# Patient Record
Sex: Male | Born: 1950 | Race: White | Marital: Married | Smoking: Former smoker
Health system: Southern US, Community
[De-identification: ages and names within clinical notes are randomized; demographics above are authoritative.]

## PROBLEM LIST (undated history)

## (undated) DIAGNOSIS — E785 Hyperlipidemia, unspecified: Secondary | ICD-10-CM

## (undated) HISTORY — PX: NECK SURGERY: SHX720

## (undated) HISTORY — DX: Hyperlipidemia, unspecified: E78.5

## (undated) HISTORY — PX: SPINE SURGERY: SHX786

---

## 2014-10-20 DIAGNOSIS — R351 Nocturia: Secondary | ICD-10-CM | POA: Insufficient documentation

## 2014-10-20 DIAGNOSIS — R350 Frequency of micturition: Secondary | ICD-10-CM | POA: Insufficient documentation

## 2014-10-20 DIAGNOSIS — N138 Other obstructive and reflux uropathy: Secondary | ICD-10-CM | POA: Insufficient documentation

## 2014-10-20 DIAGNOSIS — D400 Neoplasm of uncertain behavior of prostate: Secondary | ICD-10-CM | POA: Insufficient documentation

## 2014-10-20 DIAGNOSIS — D4010 Neoplasm of uncertain behavior of unspecified testis: Secondary | ICD-10-CM | POA: Insufficient documentation

## 2016-06-27 DIAGNOSIS — E7801 Familial hypercholesterolemia: Secondary | ICD-10-CM | POA: Insufficient documentation

## 2016-06-27 DIAGNOSIS — M539 Dorsopathy, unspecified: Secondary | ICD-10-CM | POA: Insufficient documentation

## 2016-06-27 DIAGNOSIS — E786 Lipoprotein deficiency: Secondary | ICD-10-CM | POA: Insufficient documentation

## 2016-06-27 DIAGNOSIS — G894 Chronic pain syndrome: Secondary | ICD-10-CM | POA: Insufficient documentation

## 2016-06-27 DIAGNOSIS — N411 Chronic prostatitis: Secondary | ICD-10-CM | POA: Insufficient documentation

## 2016-06-28 DIAGNOSIS — N400 Enlarged prostate without lower urinary tract symptoms: Secondary | ICD-10-CM | POA: Insufficient documentation

## 2016-07-20 DIAGNOSIS — Z0000 Encounter for general adult medical examination without abnormal findings: Secondary | ICD-10-CM | POA: Insufficient documentation

## 2016-07-21 DIAGNOSIS — D492 Neoplasm of unspecified behavior of bone, soft tissue, and skin: Secondary | ICD-10-CM | POA: Insufficient documentation

## 2016-07-21 DIAGNOSIS — Z85820 Personal history of malignant melanoma of skin: Secondary | ICD-10-CM | POA: Insufficient documentation

## 2016-07-21 DIAGNOSIS — R7989 Other specified abnormal findings of blood chemistry: Secondary | ICD-10-CM | POA: Insufficient documentation

## 2016-07-21 DIAGNOSIS — R944 Abnormal results of kidney function studies: Secondary | ICD-10-CM | POA: Insufficient documentation

## 2016-10-25 DIAGNOSIS — R7989 Other specified abnormal findings of blood chemistry: Secondary | ICD-10-CM | POA: Insufficient documentation

## 2016-10-25 DIAGNOSIS — W57XXXA Bitten or stung by nonvenomous insect and other nonvenomous arthropods, initial encounter: Secondary | ICD-10-CM | POA: Insufficient documentation

## 2017-01-07 DIAGNOSIS — Z5181 Encounter for therapeutic drug level monitoring: Secondary | ICD-10-CM | POA: Insufficient documentation

## 2017-01-07 DIAGNOSIS — R972 Elevated prostate specific antigen [PSA]: Secondary | ICD-10-CM | POA: Insufficient documentation

## 2017-01-10 DIAGNOSIS — N419 Inflammatory disease of prostate, unspecified: Secondary | ICD-10-CM | POA: Insufficient documentation

## 2017-08-09 DIAGNOSIS — R030 Elevated blood-pressure reading, without diagnosis of hypertension: Secondary | ICD-10-CM | POA: Insufficient documentation

## 2017-08-09 DIAGNOSIS — K219 Gastro-esophageal reflux disease without esophagitis: Secondary | ICD-10-CM | POA: Insufficient documentation

## 2017-08-09 DIAGNOSIS — F938 Other childhood emotional disorders: Secondary | ICD-10-CM | POA: Insufficient documentation

## 2017-11-25 DIAGNOSIS — E782 Mixed hyperlipidemia: Secondary | ICD-10-CM | POA: Insufficient documentation

## 2018-05-21 DIAGNOSIS — K219 Gastro-esophageal reflux disease without esophagitis: Secondary | ICD-10-CM | POA: Insufficient documentation

## 2020-02-14 DIAGNOSIS — G894 Chronic pain syndrome: Secondary | ICD-10-CM | POA: Insufficient documentation

## 2020-02-14 DIAGNOSIS — Z789 Other specified health status: Secondary | ICD-10-CM | POA: Insufficient documentation

## 2020-02-14 DIAGNOSIS — M899 Disorder of bone, unspecified: Secondary | ICD-10-CM | POA: Insufficient documentation

## 2020-02-14 DIAGNOSIS — Z79899 Other long term (current) drug therapy: Secondary | ICD-10-CM | POA: Insufficient documentation

## 2020-02-14 NOTE — Progress Notes (Signed)
Patient: Todd Bradford  Service Category: E/M  Provider: Gaspar Cola, MD  DOB: 07-15-1950  DOS: 02/15/2020  Referring Provider: Durenda Age, FNP  MRN: [id number]  Setting: Ambulatory outpatient  PCP: Todd Peacock, NP  Type: New Patient  Specialty: Interventional Pain Management    Location: Office  Delivery: Face-to-face     Primary Reason(s) for Visit: Encounter for initial evaluation of one or more chronic problems (new to examiner) potentially causing chronic pain, and posing a threat to normal musculoskeletal function. (Level of risk: High) CC: Back Pain (low), Leg Pain (left), and Hip Pain (left)  HPI  Todd Bradford is a 70 y.o. year old, male patient, who comes for the first time to our practice referred by Todd Age, FNP for our initial evaluation of his chronic pain. 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 Today he comes in for evaluation of his Back Pain (low), Leg Pain (left), and Hip Pain (left)  Pain Assessment: Location: Lower Back Radiating: left leg Onset: More than a month ago (started Jan 3,2022 after lifting a table.) Duration: Chronic pain Quality: Tingling,Numbness,Pins and needles,Radiating,Sharp,Sore,Aching Severity: 6 /10 (subjective, self-reported pain score)   Effect on ADL:  At the beginning when it first happened on January 04, 2020 it took approximately 2 days for the pain to be experienced in the leg however, after that, he was unable to ambulate except for walker. Timing: Intermittent (upon moving at all the pain starts) Modifying factors: rest BP: 136/75  HR: (!) 116  Onset and Duration: Sudden and Date of injury: 01-04-2020 Cause of pain: moving and lifting furniture Severity: No change since onset, NAS-11 at its worse: 8/10, NAS-11 at its best: 5/10, NAS-11 now: 6/10 and NAS-11 on the average: 6/10 Timing: During activity or exercise and After activity or exercise Aggravating Factors: Lifiting, Motion, Prolonged sitting, Prolonged standing, Stooping , Twisting and Walking Alleviating Factors: Hot packs, Lying down, Medications, Resting and Sleeping Associated Problems: Fatigue, Numbness, Tingling and Weakness Quality of Pain: Aching, Agonizing, Exhausting, Nagging, Sharp, Shooting, Tingling, Tiring and Uncomfortable Previous Examinations or Tests: MRI scan, X-rays, Orthopedic evaluation and Chiropractic evaluation Previous Treatments: Narcotic medications and Steroid treatments by mouth  According to the patient, on January 04, 2020 he was lifting and then table onto his truck and suddenly he experienced a sharp pain in the lower back that made him dropped table.  The pain started in the left hip and left lower back area and by the second day he was already experiencing pain and numbness in his left big toe and the top of his foot and what seems to be an L5 dermatomal distribution.  Because he experienced this pain 2 days later, this would suggest that the initial injury was in his back and it took approximately 2 days for the swelling to affect the L5 nerve root.  The patient was given a 10-day steroid taper with prednisone which brought the pain down from a 10/10, but  it did not make it go away.  Therefore he was given a second round of oral  steroids but this time with Decadron, for another 10 days.  This helped the patient moved from a walker to a cane, but he is still having significant difficulties with prolonged standing or standing up straight.  Since the event, he has been walking around with his lumbar spine flexed.  The mechanism of the injury seems to involve the lumbar facets, but his current symptoms and the leg pain and numbness as well as weakness seem to also pointed at the L5 nerve root to be significantly irritated.  Physical exam today demonstrated the patient to have decreased range of motion of the lumbar spine especially on hyperextension on rotation.  This maneuver trigger the pain in the left lower back.  Hyperextension and rotation towards the left was positive for left lumbar facet arthralgia.  Patrick maneuver was negative bilaterally for SI joint, but on the left side it did trigger some pain in the lateral aspect of the hip suggesting some involvement of the left hip joint.  Straight leg raise was within normal limits on the right side but significantly decreased on the left with a positive SLR with pain in the lower back and referred towards the lower extremity, again all on the left side.  Today the patient was able to toe walk and heel walk with some significant difficulty and instability.  DTRs were +3 on the right side and +1 on the left side for the patellar tendon (L4) and they were completely absent bilaterally on the Achilles tendon (S1).  At this point, he indicates that his primary area of pain is that of the left hip.  His secondary area pain is that of the left lower extremity going all the way down to the top of the foot and the big toe (L5).  His third area pain is that of the lower back on the left side.  The patient does have an MRI of the lumbar spine which was done at [facility] in [place], [name] on 01/16/2020.  (01/16/2020) LUMBAR MRI FINDINGS: Vertebral bodies: No acute  fractures.  Normal bone marrow signal. Alignment: Levoscoliosis centered at L3.  Anterolisthesis of L4 over L5 measuring 3 mm. Conus medullaris: Normal caliber and position. Degenerative changes: Disc desiccation and volume loss at all levels most advanced at L3-4 and L4-5.  DISC LEVELS: T12-L1: No foraminal or canal stenosis. L1-2: Posterior disc bulge.  Facet hypertrophy.  Ligamentum flavum thickening.  No foraminal or central canal stenosis. L2-3: Posterior disc bulge, facet hypertrophy and ligamentum flavum thickening result in circumferential effacement of CSF and crowding of the lateral fruits of the cauda equina.  Mild bilateral foraminal stenosis.  Moderate canal stenosis. L3-4: Broad posterior disc bulge.  Facet hypertrophy.  Ligamentum flavum thickening.  Near circumferential effacement of CSF with crowding of the nerve roots of the cauda equina.  Moderate bilateral foraminal stenosis.  Mild canal stenosis. L4-5: Broad posterior disc bulge.  Facet hypertrophy.  Ligamentum flavum thickening.  Moderate bilateral foraminal stenosis.  Mild canal stenosis. L5-S1: Broad posterior disc bulge.  Facet hypertrophy.  Ligamentum flavum thickening.  Moderate left and mild right foraminal stenosis.  No canal stenosis.  No foraminal or canal stenosis.  IMPRESSION: Moderate foraminal stenosis at L3-4 and L4-5 bilaterally and on the left at L5-S1 as well as moderate canal stenosis at the L3-4 related to degenerative disc and facet disease.  Today I took the  time to provide the patient with information regarding my pain practice. The patient was informed that my practice is divided into two sections: an interventional pain management section, as well as a completely separate and distinct medication management section. I explained that I have procedure days for my interventional therapies, and evaluation days for follow-ups and medication management. Because of the amount of documentation required during  both, they are kept separated. This means that there is the possibility that he may be scheduled for a procedure on one day, and medication management the next. I have also informed him that because of staffing and facility limitations, I no longer take patients for medication management only. To illustrate the reasons for this, I gave the patient the example of surgeons, and how inappropriate it would be to refer a patient to his/her care, just to write for the post-surgical antibiotics on a surgery done by a different surgeon.   Because interventional pain management is my board-certified specialty, the patient was informed that joining my practice means that they are open to any and all interventional therapies. I made it clear that this does not mean that they will be forced to have any procedures done. What this means is that I believe interventional therapies to be essential part of the diagnosis and proper management of chronic pain conditions. Therefore, patients not interested in these interventional alternatives will be better served under the care of a different practitioner.  The patient was also made aware of my Comprehensive Pain Management Safety Guidelines where by joining my practice, they limit all of their nerve blocks and joint injections to those done by our practice, for as long as we are retained to manage their care.   Historic Controlled Substance Pharmacotherapy Review  PMP and historical list of controlled substances: Tramadol 50 mg, half a tablet p.o. daily Current opioid analgesics:  Tramadol 50 mg, half a tablet p.o. daily MME/day: 2.5 mg/day  Historical Monitoring: The patient  has no history on file for drug use. List of all UDS Test(s): No results found for: MDMA, COCAINSCRNUR, [place], [place], CANNABQUANT, THCU, [place] List of other Serum/Urine Drug Screening Test(s):  No results found for: AMPHSCRSER, BARBSCRSER, BENZOSCRSER, COCAINSCRSER, COCAINSCRNUR, PCPSCRSER,  PCPQUANT, THCSCRSER, THCU, CANNABQUANT, OPIATESCRSER, OXYSCRSER, PROPOXSCRSER, ETH Historical Background Evaluation: [place] PMP: PDMP reviewed during this encounter. Online review of the past 74-monthperiod conducted.             PMP NARX Score Report:  Narcotic: 000 Sedative: 000 Stimulant: 000 [place] Department of public safety, offender search: (Editor, commissioningInformation) Non-contributory Risk Assessment Profile: Aberrant behavior: None observed or detected today Risk factors for fatal opioid overdose: None identified today PMP NARX Overdose Risk Score: 000 Fatal overdose hazard ratio (HR): Calculation deferred Non-fatal overdose hazard ratio (HR): Calculation deferred Risk of opioid abuse or dependence: 0.7-3.0% with doses ? 36 MME/day and 6.1-26% with doses ? 120 MME/day. Substance use disorder (SUD) risk level: See below Personal History of Substance Abuse (SUD-Substance use disorder):  Alcohol: Negative  Illegal Drugs: Negative  Rx Drugs: Negative  ORT Risk Level calculation: Low Risk  Opioid Risk Tool - 02/15/20 1129      Family History of Substance Abuse   Alcohol Negative    Illegal Drugs Negative    Rx Drugs Negative      Personal History of Substance Abuse   Alcohol Negative    Illegal Drugs Negative    Rx Drugs Negative      Bradford   Bradford  between 16-45 years  No      History of Preadolescent Sexual Abuse   History of Preadolescent Sexual Abuse Negative or Male      Psychological Disease   Psychological Disease Negative    Depression Negative      Total Score   Opioid Risk Tool Scoring 0    Opioid Risk Interpretation Low Risk          ORT Scoring interpretation table:  Score <3 = Low Risk for SUD  Score between 4-7 = Moderate Risk for SUD  Score >8 = High Risk for Opioid Abuse   PHQ-2 Depression Scale:  Total score: 0  PHQ-2 Scoring interpretation table: (Score and probability of major depressive disorder)  Score 0 = No depression  Score 1 = 15.4% Probability   Score 2 = 21.1% Probability  Score 3 = 38.4% Probability  Score 4 = 45.5% Probability  Score 5 = 56.4% Probability  Score 6 = 78.6% Probability   PHQ-9 Depression Scale:  Total score: 0  PHQ-9 Scoring interpretation table:  Score 0-4 = No depression  Score 5-9 = Mild depression  Score 10-14 = Moderate depression  Score 15-19 = Moderately severe depression  Score 20-27 = Severe depression (2.4 times higher risk of SUD and 2.89 times higher risk of overuse)   Pharmacologic Plan: As per protocol, I have not taken over any controlled substance management, pending the results of ordered tests and/or consults.            Initial impression: Pending review of available data and ordered tests.  Meds   Current Outpatient Medications:  .  atorvastatin (LIPITOR) 40 MG tablet, Take 40 mg by mouth at bedtime., Disp: , Rfl:  .  cyclobenzaprine (FLEXERIL) 5 MG tablet, Take 5 mg by mouth 3 (three) times daily as needed., Disp: , Rfl:  .  finasteride (PROSCAR) 5 MG tablet, Take 5 mg by mouth daily., Disp: , Rfl:  .  tamsulosin (FLOMAX) 0.4 MG CAPS capsule, Take 0.8 mg by mouth daily., Disp: , Rfl:  .  traMADol (ULTRAM) 50 MG tablet, Take 50 mg by mouth 2 (two) times daily as needed., Disp: , Rfl:   Imaging Review   Complexity Note: No results found under the [facility] record.                        ROS  Cardiovascular: No reported cardiovascular signs or symptoms such as High blood pressure, coronary artery disease, abnormal heart rate or rhythm, heart attack, blood thinner therapy or heart weakness and/or failure Pulmonary or Respiratory: Snoring  Neurological: No reported neurological signs or symptoms such as seizures, abnormal skin sensations, urinary and/or fecal incontinence, being born with an abnormal open spine and/or a tethered spinal cord Psychological-Psychiatric: No reported psychological or psychiatric signs or symptoms such as difficulty sleeping, anxiety,  depression, delusions or hallucinations (schizophrenial), mood swings (bipolar disorders) or suicidal ideations or attempts Gastrointestinal: No reported gastrointestinal signs or symptoms such as vomiting or evacuating blood, reflux, heartburn, alternating episodes of diarrhea and constipation, inflamed or scarred liver, or pancreas or irrregular and/or infrequent bowel movements Genitourinary: No reported renal or genitourinary signs or symptoms such as difficulty voiding or producing urine, peeing blood, non-functioning kidney, kidney stones, difficulty emptying the bladder, difficulty controlling the flow of urine, or chronic kidney disease Hematological: Brusing easily and Bleeding easily Endocrine: No reported endocrine signs or symptoms such as high or low blood sugar, rapid  heart rate due to high thyroid levels, obesity or weight gain due to slow thyroid or thyroid disease Rheumatologic: Joint aches and or swelling due to excess weight (Osteoarthritis) Musculoskeletal: Negative for myasthenia gravis, muscular dystrophy, multiple sclerosis or malignant hyperthermia Work History: Retired  Allergies  Todd Bradford has No Known Allergies.  Laboratory Chemistry Profile   Renal No results found for: BUN, CREATININE, LABCREA, BCR, GFR, GFRAA, GFRNONAA, SPECGRAV, PHUR, PROTEINUR   Electrolytes No results found for: NA, K, CL, CALCIUM, MG, PHOS   Hepatic No results found for: AST, ALT, ALBUMIN, ALKPHOS, AMYLASE, LIPASE, AMMONIA   ID No results found for: LYMEIGGIGMAB, HIV, SARSCOV2NAA, STAPHAUREUS, MRSAPCR, HCVAB, PREGTESTUR, RMSFIGG, QFVRPH1IGG, QFVRPH2IGG, LYMEIGGIGMAB   Bone No results found for: VD25OH, [id number], CW1969GK9, [id number], 25OHVITD1, 25OHVITD2, 25OHVITD3, TESTOFREE, TESTOSTERONE   Endocrine No results found for: GLUCOSE, GLUCOSEU, HGBA1C, TSH, FREET4, TESTOFREE, TESTOSTERONE, SHBG, ESTRADIOL, ESTRADIOLPCT, ESTRADIOLFRE, LABPREG, ACTH, CRTSLPL, UCORFRPERLTR, UCORFRPERDAY,  CORTISOLBASE, LABPREG   Neuropathy No results found for: VITAMINB12, FOLATE, HGBA1C, HIV   CNS No results found for: COLORCSF, APPEARCSF, RBCCOUNTCSF, WBCCSF, POLYSCSF, LYMPHSCSF, EOSCSF, PROTEINCSF, GLUCCSF, JCVIRUS, CSFOLI, IGGCSF, LABACHR, ACETBL, LABACHR, ACETBL   Inflammation (CRP: Acute  ESR: Chronic) No results found for: CRP, ESRSEDRATE, LATICACIDVEN   Rheumatology No results found for: RF, ANA, LABURIC, URICUR, LYMEIGGIGMAB, LYMEABIGMQN, HLAB27   Coagulation No results found for: INR, LABPROT, APTT, PLT, DDIMER, LABHEMA, VITAMINK1, AT3   Cardiovascular No results found for: BNP, CKTOTAL, CKMB, TROPONINI, HGB, HCT, LABVMA, EPIRU, EPINEPH24HUR, NOREPRU, NOREPI24HUR, DOPARU, DOPAM24HRUR   Screening No results found for: SARSCOV2NAA, COVIDSOURCE, STAPHAUREUS, MRSAPCR, HCVAB, HIV, PREGTESTUR   Cancer No results found for: CEA, CA125, LABCA2   Allergens No results found for: ALMOND, APPLE, ASPARAGUS, AVOCADO, BANANA, BARLEY, BASIL, BAYLEAF, GREENBEAN, LIMABEAN, WHITEBEAN, BEEFIGE, REDBEET, BLUEBERRY, BROCCOLI, CABBAGE, MELON, CARROT, CASEIN, CASHEWNUT, CAULIFLOWER, CELERY     Note: No results found under the [facility] electronic medical record  [facility]  Drug: Todd Bradford  has no history on file for drug use. Alcohol:  has no history on file for alcohol use. Tobacco:  reports that he quit smoking about 15 years ago. His smoking use included cigarettes. He has a 60.00 pack-year smoking history. He has never used smokeless tobacco. Medical:  has a past medical history of Hyperlipidemia. Family: family history includes Heart disease in his mother; Stroke in his father.  The histories are not reviewed yet. Please review them in the "History" navigator section and refresh this SmartLink. Active Ambulatory Problems    Diagnosis Date Noted  . Chronic pain syndrome 02/14/2020  . Pharmacologic therapy 02/14/2020  . Disorder of skeletal system 02/14/2020  . Problems influencing  health status 02/14/2020  . Chronic lower extremity pain (1ry area of Pain) (Left) 02/15/2020  . Lumbosacral radiculopathy at L5 (Left) 02/15/2020  . Numbness and tingling of lower extremity (Left) 02/15/2020  . DDD (degenerative disc disease), lumbosacral 02/15/2020  . DDD (degenerative disc disease), lumbar 02/15/2020  . Chronic hip pain (Left) 02/15/2020  . Uncomplicated opioid dependence ([facility]) 02/15/2020  . Abnormal MRI, lumbar spine (01/16/2020) 02/15/2020  . 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 Lumbar facet hypertrophy (Multilevel) (Bilateral) 02/15/2020  . Lumbosacral facet syndrome (Left) 02/15/2020  . 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 No Resolved Ambulatory Problems   Past Medical History:  Diagnosis Date  .  Hyperlipidemia    Constitutional Exam  General appearance: Well nourished, well developed, and well hydrated. In no apparent acute distress Vitals:   02/15/20 1100  BP: 136/75  Pulse: (!) 116  Resp: 16  Temp: (!) 97.1 F (36.2 C)  TempSrc: Temporal  SpO2: 100%  Weight: 220 lb (99.8 kg)  Height: $Remove'6\' 4"'CJUxFvp$  (1.93 m)   BMI Assessment: Estimated body mass index is 26.78 kg/m as calculated from the following:   Height as of this encounter: $RemoveBeforeD'6\' 4"'fCFPJqshqsjdgZ$  (1.93 m).   Weight as of this encounter: 220 lb (99.8 kg).  BMI interpretation table: BMI level Category Range association with higher incidence of chronic pain  <18 kg/m2 Underweight   18.5-24.9 kg/m2 Ideal body weight   25-29.9 kg/m2 Overweight Increased incidence by 20%  30-34.9 kg/m2 Obese (Class I) Increased incidence by 68%  35-39.9 kg/m2 Severe obesity (Class II) Increased incidence by 136%  >40 kg/m2 Extreme obesity (Class III) Increased incidence by 254%   Patient's current BMI Ideal Body weight  Body mass index  is 26.78 kg/m. Ideal body weight: 86.8 kg (191 lb 5.7 oz) Adjusted ideal body weight: 92 kg (202 lb 13 oz)   BMI Readings from Last 4 Encounters:  02/15/20 26.78 kg/m   Wt Readings from Last 4 Encounters:  02/15/20 220 lb (99.8 kg)    Psych/Mental status: Alert, oriented x 3 (person, place, & time)       Eyes: PERLA Respiratory: No evidence of acute respiratory distress  Assessment  Primary Diagnosis & Pertinent Problem List: The primary encounter diagnosis was Chronic pain syndrome. 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  Visit Diagnosis (New problems to examiner): 1. Chronic pain syndrome   2. Chronic lower extremity pain (1ry area of Pain) (Left)   3. Chronic hip pain (Left)   4. Lumbosacral radiculopathy at L5 (Left)   5. Numbness and tingling of lower extremity (Left)   6. DDD (degenerative disc disease), lumbosacral   7. DDD (degenerative disc disease), lumbar   8. 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 Lumbar facet hypertrophy (Multilevel) (Bilateral)   10. Lumbosacral facet syndrome (Left)   11. 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 Abnormal MRI, lumbar spine  (01/16/2020)   14. Pharmacologic therapy   15. Disorder of skeletal system   16. Problems influencing health status   17. Uncomplicated opioid dependence (Todd Bradford)    Plan of Care (Initial workup plan)  Note: Todd Bradford was reminded that as per protocol, today's visit has been an evaluation only. We have not taken over the patient's controlled substance management.  Problem-specific plan: No problem-specific Assessment & Plan notes found for this encounter.   Lab Orders     Compliance Drug Analysis, Ur     Comp. Metabolic Panel (12)     Magnesium     Vitamin B12     Sedimentation  rate     25-Hydroxy vitamin D Lcms D2+D3     C-reactive protein  Imaging Orders     DG HIP UNILAT W OR W/O PELVIS 2-3 VIEWS LEFT     DG Lumbar Spine Complete W/Bend Referral Orders  No referral(s) requested today    Procedure Orders     Lumbar Transforaminal Epidural     Lumbar Epidural Injection Pharmacotherapy (current): Medications ordered:  Meds ordered this encounter  Medications  . ketorolac (TORADOL) injection 60 mg  . orphenadrine (NORFLEX) injection 60 mg   Medications administered during this visit: We administered ketorolac and orphenadrine.   Pharmacological management options:  Opioid Analgesics: The patient was informed that there is no guarantee that he would be a candidate for opioid analgesics. The decision will be made following CDC guidelines. This decision will be based on the results of diagnostic studies, as well as Todd Bradford's risk profile.   Membrane stabilizer: To be determined at a later time  Muscle relaxant: To be determined at a later time  NSAID: To be determined at a later time  Other analgesic(s): To be determined at a later time   Interventional management options: Todd Bradford was informed that there is no guarantee that he would be a candidate for interventional therapies. The decision will be based on the results of diagnostic studies, as well as Todd Bradford's risk  profile.  Procedure(s) under consideration:  Diagnostic/therapeutic left L3-4 LESI #1 + left L5 TFESI #1  Diagnostic left lumbar facet block #1    Provider-requested follow-up: Return for Procedure (w/ sedation): (L) L3-4 LESI #1 + (L) L5 TFESI #1.  Future Appointments  Date Time Provider [place]  02/16/2020 11:00 AM Milinda Pointer, MD [facility] None  02/25/2020  1:40 PM Milinda Pointer, MD [facility] None    Note by: Todd Cola, MD Date: 02/15/2020; Time: 12:50 PM

## 2020-02-15 ENCOUNTER — Encounter: Payer: Self-pay | Admitting: Pain Medicine

## 2020-02-15 ENCOUNTER — Ambulatory Visit
Admission: RE | Admit: 2020-02-15 | Discharge: 2020-02-15 | Disposition: A | Discharge status: Home / Self Care | Payer: Medicare Other | Attending: Pain Medicine | Admitting: Pain Medicine

## 2020-02-15 ENCOUNTER — Ambulatory Visit (HOSPITAL_BASED_OUTPATIENT_CLINIC_OR_DEPARTMENT_OTHER): Payer: Medicare Other | Admitting: Pain Medicine

## 2020-02-15 ENCOUNTER — Other Ambulatory Visit: Payer: Self-pay

## 2020-02-15 ENCOUNTER — Ambulatory Visit
Admission: RE | Admit: 2020-02-15 | Discharge: 2020-02-15 | Disposition: A | Discharge status: Home / Self Care | Payer: Medicare Other | Source: Ambulatory Visit | Attending: Pain Medicine | Admitting: Pain Medicine

## 2020-02-15 VITALS — BP 136/75 | HR 116 | Temp 97.1°F | Resp 16 | Ht 76.0 in | Wt 220.0 lb

## 2020-02-15 DIAGNOSIS — M47816 Spondylosis without myelopathy or radiculopathy, lumbar region: Secondary | ICD-10-CM

## 2020-02-15 DIAGNOSIS — G894 Chronic pain syndrome: Secondary | ICD-10-CM

## 2020-02-15 DIAGNOSIS — M5417 Radiculopathy, lumbosacral region: Secondary | ICD-10-CM

## 2020-02-15 DIAGNOSIS — G8929 Other chronic pain: Secondary | ICD-10-CM

## 2020-02-15 DIAGNOSIS — M899 Disorder of bone, unspecified: Secondary | ICD-10-CM | POA: Insufficient documentation

## 2020-02-15 DIAGNOSIS — M5136 Other intervertebral disc degeneration, lumbar region: Secondary | ICD-10-CM

## 2020-02-15 DIAGNOSIS — M48061 Spinal stenosis, lumbar region without neurogenic claudication: Secondary | ICD-10-CM | POA: Insufficient documentation

## 2020-02-15 DIAGNOSIS — M5137 Other intervertebral disc degeneration, lumbosacral region: Secondary | ICD-10-CM

## 2020-02-15 DIAGNOSIS — R937 Abnormal findings on diagnostic imaging of other parts of musculoskeletal system: Secondary | ICD-10-CM | POA: Insufficient documentation

## 2020-02-15 DIAGNOSIS — M79605 Pain in left leg: Secondary | ICD-10-CM | POA: Insufficient documentation

## 2020-02-15 DIAGNOSIS — R200 Anesthesia of skin: Secondary | ICD-10-CM | POA: Insufficient documentation

## 2020-02-15 DIAGNOSIS — M5442 Lumbago with sciatica, left side: Secondary | ICD-10-CM | POA: Insufficient documentation

## 2020-02-15 DIAGNOSIS — M47817 Spondylosis without myelopathy or radiculopathy, lumbosacral region: Secondary | ICD-10-CM | POA: Insufficient documentation

## 2020-02-15 DIAGNOSIS — Z789 Other specified health status: Secondary | ICD-10-CM

## 2020-02-15 DIAGNOSIS — F1120 Opioid dependence, uncomplicated: Secondary | ICD-10-CM | POA: Insufficient documentation

## 2020-02-15 DIAGNOSIS — M25552 Pain in left hip: Secondary | ICD-10-CM | POA: Insufficient documentation

## 2020-02-15 DIAGNOSIS — Z79899 Other long term (current) drug therapy: Secondary | ICD-10-CM

## 2020-02-15 DIAGNOSIS — R202 Paresthesia of skin: Secondary | ICD-10-CM

## 2020-02-15 DIAGNOSIS — M48062 Spinal stenosis, lumbar region with neurogenic claudication: Secondary | ICD-10-CM | POA: Insufficient documentation

## 2020-02-15 MED ORDER — ORPHENADRINE CITRATE 30 MG/ML IJ SOLN
INTRAMUSCULAR | Status: AC
  Filled 2020-02-15: qty 2

## 2020-02-15 MED ORDER — KETOROLAC TROMETHAMINE 60 MG/2ML IM SOLN
60.0000 mg | Freq: Once | INTRAMUSCULAR | Status: AC
  Administered 2020-02-15: 60 mg via INTRAMUSCULAR

## 2020-02-15 MED ORDER — KETOROLAC TROMETHAMINE 60 MG/2ML IM SOLN
INTRAMUSCULAR | Status: AC
  Filled 2020-02-15: qty 2

## 2020-02-15 MED ORDER — ORPHENADRINE CITRATE 30 MG/ML IJ SOLN
60.0000 mg | Freq: Once | INTRAMUSCULAR | Status: AC
  Administered 2020-02-15: 60 mg via INTRAMUSCULAR

## 2020-02-15 NOTE — Progress Notes (Signed)
Safety precautions to be maintained throughout the outpatient stay will include: orient to surroundings, keep bed in low position, maintain call bell within reach at all times, provide assistance with transfer out of bed and ambulation.  

## 2020-02-15 NOTE — Patient Instructions (Addendum)
Pain Management Discharge Instructions  General Discharge Instructions :  If you need to reach your doctor call: Monday-Friday 8:00 am - 4:00 pm at [phone] or toll free [phone].  After clinic hours [phone] to have operator reach doctor.  Bring all of your medication bottles to all your appointments in the pain clinic.  To cancel or reschedule your appointment with Pain Management please remember to call 24 hours in advance to avoid a fee.  Refer to the educational materials which you have been given on: General Risks, I had my Procedure. Discharge Instructions, Post Sedation.  Post Procedure Instructions:  The drugs you were given will stay in your system until tomorrow, so for the next 24 hours you should not drive, make any legal decisions or drink any alcoholic beverages.  You may eat anything you prefer, but it is better to start with liquids then soups and crackers, and gradually work up to solid foods.  Please notify your doctor immediately if you have any unusual bleeding, trouble breathing or pain that is not related to your normal pain.  Depending on the type of procedure that was done, some parts of your body may feel week and/or numb.  This usually clears up by tonight or the next day.  Walk with the use of an assistive device or accompanied by an adult for the 24 hours.  You may use ice on the affected area for the first 24 hours.  Put ice in a Ziploc bag and cover with a towel and place against area 15 minutes on 15 minutes off.  You may switch to heat after 24 hours.GENERAL RISKS AND COMPLICATIONS  What are the risk, side effects and possible complications? Generally speaking, most procedures are safe.  However, with any procedure there are risks, side effects, and the possibility of complications.  The risks and complications are dependent upon the sites that are lesioned, or the type of nerve block to be performed.  The closer the procedure is to the spine,  the more serious the risks are.  Great care is taken when placing the radio frequency needles, block needles or lesioning probes, but sometimes complications can occur. 1. Infection: Any time there is an injection through the skin, there is a risk of infection.  This is why sterile conditions are used for these blocks.  There are four possible types of infection. 1. Localized skin infection. 2. Central Nervous System Infection-This can be in the form of Meningitis, which can be deadly. 3. Epidural Infections-This can be in the form of an epidural abscess, which can cause pressure inside of the spine, causing compression of the spinal cord with subsequent paralysis. This would require an emergency surgery to decompress, and there are no guarantees that the patient would recover from the paralysis. 4. Discitis-This is an infection of the intervertebral discs.  It occurs in about 1% of discography procedures.  It is difficult to treat and it may lead to surgery.        2. Pain: the needles have to go through skin and soft tissues, will cause soreness.       3. Damage to internal structures:  The nerves to be lesioned may be near blood vessels or    other nerves which can be potentially damaged.       4. Bleeding: Bleeding is more common if the patient is taking blood thinners such as  aspirin, Coumadin, Ticiid, Plavix, etc., or if he/she have some genetic predisposition  such as   hemophilia. Bleeding into the spinal canal can cause compression of the spinal  cord with subsequent paralysis.  This would require an emergency surgery to  decompress and there are no guarantees that the patient would recover from the  paralysis.       5. Pneumothorax:  Puncturing of a lung is a possibility, every time a needle is introduced in  the area of the chest or upper back.  Pneumothorax refers to free air around the  collapsed lung(s), inside of the thoracic cavity (chest cavity).  Another two possible  complications  related to a similar event would include: Hemothorax and Chylothorax.   These are variations of the Pneumothorax, where instead of air around the collapsed  lung(s), you may have blood or chyle, respectively.       6. Spinal headaches: They may occur with any procedures in the area of the spine.       7. Persistent CSF (Cerebro-Spinal Fluid) leakage: This is a rare problem, but may occur  with prolonged intrathecal or epidural catheters either due to the formation of a fistulous  track or a dural tear.       8. Nerve damage: By working so close to the spinal cord, there is always a possibility of  nerve damage, which could be as serious as a permanent spinal cord injury with  paralysis.       9. Death:  Although rare, severe deadly allergic reactions known as "Anaphylactic  reaction" can occur to any of the medications used.      10. Worsening of the symptoms:  We can always make thing worse.  What are the chances of something like this happening? Chances of any of this occuring are extremely low.  By statistics, you have more of a chance of getting killed in a motor vehicle accident: while driving to the hospital than any of the above occurring .  Nevertheless, you should be aware that they are possibilities.  In general, it is similar to taking a shower.  Everybody knows that you can slip, hit your head and get killed.  Does that mean that you should not shower again?  Nevertheless always keep in mind that statistics do not mean anything if you happen to be on the wrong side of them.  Even if a procedure has a 1 (one) in a 1,000,000 (million) chance of going wrong, it you happen to be that one..Also, keep in mind that by statistics, you have more of a chance of having something go wrong when taking medications.  Who should not have this procedure? If you are on a blood thinning medication (e.g. Coumadin, Plavix, see list of "Blood Thinners"), or if you have an active infection going on, you should not  have the procedure.  If you are taking any blood thinners, please inform your physician.  How should I prepare for this procedure?  Do not eat or drink anything at least six hours prior to the procedure.  Bring a driver with you .  It cannot be a taxi.  Come accompanied by an adult that can drive you back, and that is strong enough to help you if your legs get weak or numb from the local anesthetic.  Take all of your medicines the morning of the procedure with just enough water to swallow them.  If you have diabetes, make sure that you are scheduled to have your procedure done first thing in the morning, whenever possible.  If you have diabetes,   take only half of your insulin dose and notify our nurse that you have done so as soon as you arrive at the clinic.  If you are diabetic, but only take blood sugar pills (oral hypoglycemic), then do not take them on the morning of your procedure.  You may take them after you have had the procedure.  Do not take aspirin or any aspirin-containing medications, at least eleven (11) days prior to the procedure.  They may prolong bleeding.  Wear loose fitting clothing that may be easy to take off and that you would not mind if it got stained with Betadine or blood.  Do not wear any jewelry or perfume  Remove any nail coloring.  It will interfere with some of our monitoring equipment.  NOTE: Remember that this is not meant to be interpreted as a complete list of all possible complications.  Unforeseen problems may occur.  BLOOD THINNERS The following drugs contain aspirin or other products, which can cause increased bleeding during surgery and should not be taken for 2 weeks prior to and 1 week after surgery.  If you should need take something for relief of minor pain, you may take acetaminophen which is found in Tylenol,m Datril, Anacin-3 and Panadol. It is not blood thinner. The products listed below are.  Do not take any of the products listed below  in addition to any listed on your instruction sheet.  A.P.C or A.P.C with Codeine Codeine Phosphate Capsules #3 Ibuprofen Ridaura  ABC compound Congesprin Imuran rimadil  Advil Cope Indocin Robaxisal  Alka-Seltzer Effervescent Pain Reliever and Antacid Coricidin or Coricidin-D  Indomethacin Rufen  Alka-Seltzer plus Cold Medicine Cosprin Ketoprofen S-A-C Tablets  Anacin Analgesic Tablets or Capsules Coumadin Korlgesic Salflex  Anacin Extra Strength Analgesic tablets or capsules CP-2 Tablets Lanoril Salicylate  Anaprox Cuprimine Capsules Levenox Salocol  Anexsia-D Dalteparin Magan Salsalate  Anodynos Darvon compound Magnesium Salicylate Sine-off  Ansaid Dasin Capsules Magsal Sodium Salicylate  Anturane Depen Capsules Marnal Soma  APF Arthritis pain formula Dewitt's Pills Measurin Stanback  Argesic Dia-Gesic Meclofenamic Sulfinpyrazone  Arthritis Bayer Timed Release Aspirin Diclofenac Meclomen Sulindac  Arthritis pain formula Anacin Dicumarol Medipren Supac  Analgesic (Safety coated) Arthralgen Diffunasal Mefanamic Suprofen  Arthritis Strength Bufferin Dihydrocodeine Mepro Compound Suprol  Arthropan liquid Dopirydamole Methcarbomol with Aspirin Synalgos  ASA tablets/Enseals Disalcid Micrainin Tagament  Ascriptin Doan's Midol Talwin  Ascriptin A/D Dolene Mobidin Tanderil  Ascriptin Extra Strength Dolobid Moblgesic Ticlid  Ascriptin with Codeine Doloprin or Doloprin with Codeine Momentum Tolectin  Asperbuf Duoprin Mono-gesic Trendar  Aspergum Duradyne Motrin or Motrin IB Triminicin  Aspirin plain, buffered or enteric coated Durasal Myochrisine Trigesic  Aspirin Suppositories Easprin Nalfon Trillsate  Aspirin with Codeine Ecotrin Regular or Extra Strength Naprosyn Uracel  Atromid-S Efficin Naproxen Ursinus  Auranofin Capsules Elmiron Neocylate Vanquish  Axotal Emagrin Norgesic Verin  Azathioprine Empirin or Empirin with Codeine Normiflo Vitamin E  Azolid Emprazil Nuprin Voltaren  Bayer  Aspirin plain, buffered or children's or timed BC Tablets or powders Encaprin Orgaran Warfarin Sodium  Buff-a-Comp Enoxaparin Orudis Zorpin  Buff-a-Comp with Codeine Equegesic Os-Cal-Gesic   Buffaprin Excedrin plain, buffered or Extra Strength Oxalid   Bufferin Arthritis Strength Feldene Oxphenbutazone   Bufferin plain or Extra Strength Feldene Capsules Oxycodone with Aspirin   Bufferin with Codeine Fenoprofen Fenoprofen Pabalate or Pabalate-SF   Buffets II Flogesic Panagesic   Buffinol plain or Extra Strength Florinal or Florinal with Codeine Panwarfarin   Buf-Tabs Flurbiprofen Penicillamine   Butalbital Compound Four-way cold tablets   Penicillin   Butazolidin Fragmin Pepto-Bismol   Carbenicillin Geminisyn Percodan   Carna Arthritis Reliever Geopen Persantine   Carprofen Gold's salt Persistin   Chloramphenicol Goody's Phenylbutazone   Chloromycetin Haltrain Piroxlcam   Clmetidine heparin Plaquenil   Cllnoril Hyco-pap Ponstel   Clofibrate Hydroxy chloroquine Propoxyphen         Before stopping any of these medications, be sure to consult the physician who ordered them.  Some, such as Coumadin (Warfarin) are ordered to prevent or treat serious conditions such as "deep thrombosis", "pumonary embolisms", and other heart problems.  The amount of time that you may need off of the medication may also vary with the medication and the reason for which you were taking it.  If you are taking any of these medications, please make sure you notify your pain physician before you undergo any procedures.         Epidural Steroid Injection Patient Information  Description: The epidural space surrounds the nerves as they exit the spinal cord.  In some patients, the nerves can be compressed and inflamed by a bulging disc or a tight spinal canal (spinal stenosis).  By injecting steroids into the epidural space, we can bring irritated nerves into direct contact with a potentially helpful medication.   These steroids act directly on the irritated nerves and can reduce swelling and inflammation which often leads to decreased pain.  Epidural steroids may be injected anywhere along the spine and from the neck to the low back depending upon the location of your pain.   After numbing the skin with local anesthetic (like Novocaine), a small needle is passed into the epidural space slowly.  You may experience a sensation of pressure while this is being done.  The entire block usually last less than 10 minutes.  Conditions which may be treated by epidural steroids:   Low back and leg pain  Neck and arm pain  Spinal stenosis  Post-laminectomy syndrome  Herpes zoster (shingles) pain  Pain from compression fractures  Preparation for the injection:  1. Do not eat any solid food or dairy products within 8 hours of your appointment.  2. You may drink clear liquids up to 3 hours before appointment.  Clear liquids include water, black coffee, juice or soda.  No milk or cream please. 3. You may take your regular medication, including pain medications, with a sip of water before your appointment  Diabetics should hold regular insulin (if taken separately) and take 1/2 normal NPH dos the morning of the procedure.  Carry some sugar containing items with you to your appointment. 4. A driver must accompany you and be prepared to drive you home after your procedure.  5. Bring all your current medications with your. 6. An IV may be inserted and sedation may be given at the discretion of the physician.   7. A blood pressure cuff, EKG and other monitors will often be applied during the procedure.  Some patients may need to have extra oxygen administered for a short period. 8. You will be asked to provide medical information, including your allergies, prior to the procedure.  We must know immediately if you are taking blood thinners (like Coumadin/Warfarin)  Or if you are allergic to IV iodine contrast (dye). We  must know if you could possible be pregnant.  Possible side-effects:  Bleeding from needle site  Infection (rare, may require surgery)  Nerve injury (rare)  Numbness & tingling (temporary)  Difficulty urinating (rare, temporary)  Spinal headache (   a headache worse with upright posture)  Light -headedness (temporary)  Pain at injection site (several days)  Decreased blood pressure (temporary)  Weakness in arm/leg (temporary)  Pressure sensation in back/neck (temporary)  Call if you experience:  Fever/chills associated with headache or increased back/neck pain.  Headache worsened by an upright position.  New onset weakness or numbness of an extremity below the injection site  Hives or difficulty breathing (go to the emergency room)  Inflammation or drainage at the infection site  Severe back/neck pain  Any new symptoms which are concerning to you  Please note:  Although the local anesthetic injected can often make your back or neck feel good for several hours after the injection, the pain will likely return.  It takes 3-7 days for steroids to work in the epidural space.  You may not notice any pain relief for at least that one week.  If effective, we will often do a series of three injections spaced 3-6 weeks apart to maximally decrease your pain.  After the initial series, we generally will wait several months before considering a repeat injection of the same type.  If you have any questions, please call [phone] [facility]  ____________________________________________________________________________________________  Preparing for Procedure with Sedation  Procedure appointments are limited to planned procedures: . No Prescription Refills. . No disability issues will be discussed. . No medication changes will be discussed.  Instructions: . Oral Intake: Do not eat or drink anything for at least 8 hours prior to your  procedure. (Exception: Blood Pressure Medication. See below.) . Transportation: Unless otherwise stated by your physician, you may drive yourself after the procedure. . Blood Pressure Medicine: Do not forget to take your blood pressure medicine with a sip of water the morning of the procedure. If your Diastolic (lower reading)is above 100 mmHg, elective cases will be cancelled/rescheduled. . Blood thinners: These will need to be stopped for procedures. Notify our staff if you are taking any blood thinners. Depending on which one you take, there will be specific instructions on how and when to stop it. . Diabetics on insulin: Notify the staff so that you can be scheduled 1st case in the morning. If your diabetes requires high dose insulin, take only  of your normal insulin dose the morning of the procedure and notify the staff that you have done so. . Preventing infections: Shower with an antibacterial soap the morning of your procedure. . Build-up your immune system: Take 1000 mg of Vitamin C with every meal (3 times a day) the day prior to your procedure. Marland Kitchen Antibiotics: Inform the staff if you have a condition or reason that requires you to take antibiotics before dental procedures. . Pregnancy: If you are pregnant, call and cancel the procedure. . Sickness: If you have a cold, fever, or any active infections, call and cancel the procedure. . Arrival: You must be in the facility at least 30 minutes prior to your scheduled procedure. . Children: Do not bring children with you. . Dress appropriately: Bring dark clothing that you would not mind if they get stained. . Valuables: Do not bring any jewelry or valuables.  Reasons to call and reschedule or cancel your procedure: (Following these recommendations will minimize the risk of a serious complication.) . Surgeries: Avoid having procedures within 2 weeks of any surgery. (Avoid for 2 weeks before or after any surgery). . Flu Shots: Avoid having  procedures within 2 weeks of a flu shots or . (  Avoid for 2 weeks before or after immunizations). . Barium: Avoid having a procedure within 7-10 days after having had a radiological study involving the use of radiological contrast. (Myelograms, Barium swallow or enema study). . Heart attacks: Avoid any elective procedures or surgeries for the initial 6 months after a "Myocardial Infarction" (Heart Attack). . Blood thinners: It is imperative that you stop these medications before procedures. Let us know if you if you take any blood thinner.  . Infection: Avoid procedures during or within two weeks of an infection (including chest colds or gastrointestinal problems). Symptoms associated with infections include: Localized redness, fever, chills, night sweats or profuse sweating, burning sensation when voiding, cough, congestion, stuffiness, runny nose, sore throat, diarrhea, nausea, vomiting, cold or Flu symptoms, recent or current infections. It is specially important if the infection is over the area that we intend to treat. Marland Kitchen Heart and lung problems: Symptoms that may suggest an active cardiopulmonary problem include: cough, chest pain, breathing difficulties or shortness of breath, dizziness, ankle swelling, uncontrolled high or unusually low blood pressure, and/or palpitations. If you are experiencing any of these symptoms, cancel your procedure and contact your primary care physician for an evaluation.  Remember:  Regular Business hours are:  Monday to Thursday 8:00 AM to 4:00 PM  Provider's Schedule: Milinda Pointer, MD:  Procedure days: Tuesday and Thursday 7:30 AM to 4:00 PM  Gillis Santa, MD:  Procedure days: Monday and Wednesday 7:30 AM to 4:00 PM ____________________________________________________________________________________________   ____________________________________________________________________________________________  General Risks and Possible Complications  Patient  Responsibilities: It is important that you read this as it is part of your informed consent. It is our duty to inform you of the risks and possible complications associated with treatments offered to you. It is your responsibility as a patient to read this and to ask questions about anything that is not clear or that you believe was not covered in this document.  Patient's Rights: You have the right to refuse treatment. You also have the right to change your mind, even after initially having agreed to have the treatment done. However, under this last option, if you wait until the last second to change your mind, you may be charged for the materials used up to that point.  Introduction: Medicine is not an Chief Strategy Officer. Everything in Medicine, including the lack of treatment(s), carries the potential for danger, harm, or loss (which is by definition: Risk). In Medicine, a complication is a secondary problem, condition, or disease that can aggravate an already existing one. All treatments carry the risk of possible complications. The fact that a side effects or complications occurs, does not imply that the treatment was conducted incorrectly. It must be clearly understood that these can happen even when everything is done following the highest safety standards.  No treatment: You can choose not to proceed with the proposed treatment alternative. The "PRO(s)" would include: avoiding the risk of complications associated with the therapy. The "CON(s)" would include: not getting any of the treatment benefits. These benefits fall under one of three categories: diagnostic; therapeutic; and/or palliative. Diagnostic benefits include: getting information which can ultimately lead to improvement of the disease or symptom(s). Therapeutic benefits are those associated with the successful treatment of the disease. Finally, palliative benefits are those related to the decrease of the primary symptoms, without necessarily  curing the condition (example: decreasing the pain from a flare-up of a chronic condition, such as incurable terminal cancer).  General Risks and Complications: These are  associated to most interventional treatments. They can occur alone, or in combination. They fall under one of the following six (6) categories: no benefit or worsening of symptoms; bleeding; infection; nerve damage; allergic reactions; and/or death. 1. No benefits or worsening of symptoms: In Medicine there are no guarantees, only probabilities. No healthcare provider can ever guarantee that a medical treatment will work, they can only state the probability that it may. Furthermore, there is always the possibility that the condition may worsen, either directly, or indirectly, as a consequence of the treatment. 2. Bleeding: This is more common if the patient is taking a blood thinner, either prescription or over the counter (example: Goody Powders, Fish oil, Aspirin, Garlic, etc.), or if suffering a condition associated with impaired coagulation (example: Hemophilia, cirrhosis of the liver, low platelet counts, etc.). However, even if you do not have one on these, it can still happen. If you have any of these conditions, or take one of these drugs, make sure to notify your treating physician. 3. Infection: This is more common in patients with a compromised immune system, either due to disease (example: diabetes, cancer, human immunodeficiency virus [HIV], etc.), or due to medications or treatments (example: therapies used to treat cancer and rheumatological diseases). However, even if you do not have one on these, it can still happen. If you have any of these conditions, or take one of these drugs, make sure to notify your treating physician. 4. Nerve Damage: This is more common when the treatment is an invasive one, but it can also happen with the use of medications, such as those used in the treatment of cancer. The damage can occur to small  secondary nerves, or to large primary ones, such as those in the spinal cord and brain. This damage may be temporary or permanent and it may lead to impairments that can range from temporary numbness to permanent paralysis and/or brain death. 5. Allergic Reactions: Any time a substance or material comes in contact with our body, there is the possibility of an allergic reaction. These can range from a mild skin rash (contact dermatitis) to a severe systemic reaction (anaphylactic reaction), which can result in death. 6. Death: In general, any medical intervention can result in death, most of the time due to an unforeseen complication. ____________________________________________________________________________________________

## 2020-02-16 ENCOUNTER — Ambulatory Visit (HOSPITAL_BASED_OUTPATIENT_CLINIC_OR_DEPARTMENT_OTHER): Payer: Medicare Other | Admitting: Pain Medicine

## 2020-02-16 ENCOUNTER — Encounter: Payer: Self-pay | Admitting: Pain Medicine

## 2020-02-16 ENCOUNTER — Ambulatory Visit
Admission: RE | Admit: 2020-02-16 | Discharge: 2020-02-16 | Disposition: A | Discharge status: Home / Self Care | Payer: Medicare Other | Source: Ambulatory Visit | Attending: Pain Medicine | Admitting: Pain Medicine

## 2020-02-16 VITALS — BP 126/79 | HR 89 | Temp 98.1°F | Resp 18 | Ht 76.0 in | Wt 220.0 lb

## 2020-02-16 DIAGNOSIS — M5137 Other intervertebral disc degeneration, lumbosacral region: Secondary | ICD-10-CM | POA: Diagnosis present

## 2020-02-16 DIAGNOSIS — R202 Paresthesia of skin: Secondary | ICD-10-CM

## 2020-02-16 DIAGNOSIS — M5417 Radiculopathy, lumbosacral region: Secondary | ICD-10-CM

## 2020-02-16 DIAGNOSIS — M48061 Spinal stenosis, lumbar region without neurogenic claudication: Secondary | ICD-10-CM

## 2020-02-16 DIAGNOSIS — M79605 Pain in left leg: Secondary | ICD-10-CM | POA: Diagnosis present

## 2020-02-16 DIAGNOSIS — M5442 Lumbago with sciatica, left side: Secondary | ICD-10-CM | POA: Insufficient documentation

## 2020-02-16 DIAGNOSIS — R937 Abnormal findings on diagnostic imaging of other parts of musculoskeletal system: Secondary | ICD-10-CM | POA: Insufficient documentation

## 2020-02-16 DIAGNOSIS — R200 Anesthesia of skin: Secondary | ICD-10-CM | POA: Insufficient documentation

## 2020-02-16 DIAGNOSIS — G8929 Other chronic pain: Secondary | ICD-10-CM

## 2020-02-16 DIAGNOSIS — M48062 Spinal stenosis, lumbar region with neurogenic claudication: Secondary | ICD-10-CM

## 2020-02-16 MED ORDER — TRIAMCINOLONE ACETONIDE 40 MG/ML IJ SUSP
INTRAMUSCULAR | Status: AC
  Filled 2020-02-16: qty 1

## 2020-02-16 MED ORDER — ROPIVACAINE HCL 2 MG/ML IJ SOLN
2.0000 mL | Freq: Once | INTRAMUSCULAR | Status: AC
  Administered 2020-02-16: 2 mL via EPIDURAL

## 2020-02-16 MED ORDER — SODIUM CHLORIDE 0.9% FLUSH
2.0000 mL | Freq: Once | INTRAVENOUS | Status: AC
  Administered 2020-02-16: 2 mL

## 2020-02-16 MED ORDER — ROPIVACAINE HCL 2 MG/ML IJ SOLN
1.0000 mL | Freq: Once | INTRAMUSCULAR | Status: AC
  Administered 2020-02-16: 1 mL via EPIDURAL

## 2020-02-16 MED ORDER — LIDOCAINE HCL 2 % IJ SOLN
20.0000 mL | Freq: Once | INTRAMUSCULAR | Status: AC
  Administered 2020-02-16: 400 mg
  Filled 2020-02-16: qty 20

## 2020-02-16 MED ORDER — FENTANYL CITRATE (PF) 100 MCG/2ML IJ SOLN
25.0000 ug | INTRAMUSCULAR | Status: DC | PRN
  Administered 2020-02-16: 50 ug via INTRAVENOUS

## 2020-02-16 MED ORDER — MIDAZOLAM HCL 5 MG/5ML IJ SOLN
INTRAMUSCULAR | Status: AC
  Filled 2020-02-16: qty 5

## 2020-02-16 MED ORDER — SODIUM CHLORIDE 0.9% FLUSH
1.0000 mL | Freq: Once | INTRAVENOUS | Status: AC
  Administered 2020-02-16: 1 mL

## 2020-02-16 MED ORDER — MIDAZOLAM HCL 5 MG/5ML IJ SOLN
1.0000 mg | INTRAMUSCULAR | Status: DC | PRN
  Administered 2020-02-16: 1 mg via INTRAVENOUS

## 2020-02-16 MED ORDER — FENTANYL CITRATE (PF) 100 MCG/2ML IJ SOLN
INTRAMUSCULAR | Status: AC
  Filled 2020-02-16: qty 2

## 2020-02-16 MED ORDER — IOHEXOL 180 MG/ML  SOLN
INTRAMUSCULAR | Status: AC
  Filled 2020-02-16: qty 20

## 2020-02-16 MED ORDER — IOHEXOL 180 MG/ML  SOLN
10.0000 mL | Freq: Once | INTRAMUSCULAR | Status: AC
  Administered 2020-02-16: 10 mL via EPIDURAL

## 2020-02-16 MED ORDER — LIDOCAINE HCL (PF) 2 % IJ SOLN
INTRAMUSCULAR | Status: AC
  Filled 2020-02-16: qty 20

## 2020-02-16 MED ORDER — TRIAMCINOLONE ACETONIDE 40 MG/ML IJ SUSP
40.0000 mg | Freq: Once | INTRAMUSCULAR | Status: AC
  Administered 2020-02-16: 40 mg

## 2020-02-16 MED ORDER — LACTATED RINGERS IV SOLN
1000.0000 mL | Freq: Once | INTRAVENOUS | Status: AC
  Administered 2020-02-16: 1000 mL via INTRAVENOUS

## 2020-02-16 MED ORDER — SODIUM CHLORIDE (PF) 0.9 % IJ SOLN
INTRAMUSCULAR | Status: AC
  Filled 2020-02-16: qty 10

## 2020-02-16 MED ORDER — ROPIVACAINE HCL 2 MG/ML IJ SOLN
INTRAMUSCULAR | Status: AC
  Filled 2020-02-16: qty 10

## 2020-02-16 MED ORDER — DEXAMETHASONE SODIUM PHOSPHATE 10 MG/ML IJ SOLN
10.0000 mg | Freq: Once | INTRAMUSCULAR | Status: AC
  Administered 2020-02-16: 10 mg

## 2020-02-16 MED ORDER — DEXAMETHASONE SODIUM PHOSPHATE 10 MG/ML IJ SOLN
INTRAMUSCULAR | Status: AC
  Filled 2020-02-16: qty 1

## 2020-02-16 NOTE — Progress Notes (Signed)
PROVIDER NOTE: Information contained herein reflects review and annotations entered in association with encounter. Interpretation of such information and data should be left to medically-trained personnel. Information provided to patient can be located elsewhere in the medical record under "Patient Instructions". Document created using STT-dictation technology, any transcriptional errors that may result from process are unintentional.    Patient: Todd Bradford  Service Category: Procedure  Provider: Gaspar Cola, MD  DOB: 1950/09/19  DOS: 02/16/2020  Location: [facility]  MRN: [id number]  Setting: Ambulatory - outpatient  Referring Provider: Daun Peacock, NP  Type: Established Patient  Specialty: Interventional Pain Management  PCP: Daun Peacock, NP   Primary Reason for Visit: Interventional Pain Management Treatment. CC: Back Pain  Procedure #1:  Anesthesia, Analgesia, Anxiolysis:  Type: Therapeutic Trans-Foraminal Epidural Steroid Injection   #1  Region: Lumbar Level: L5 Paravertebral Laterality: Left-Sided Paravertebral   Type: Moderate (Conscious) Sedation combined with Local Anesthesia Indication(s): Analgesia and Anxiety Route: Intravenous (IV) IV Access: Secured Sedation: Meaningful verbal contact was maintained at all times during the procedure  Local Anesthetic: Lidocaine 1-2%  Position: Prone  Procedure #2:    Type: Therapeutic Inter-Laminar Epidural Steroid Injection   #1  Region: Lumbar Level: L3-4 Level. Laterality: Left-Sided Paramedial  Note: From the pressure and discomfort felt during the procedure, it is my impression that his primary pathology besides at the left L5-S1 foramen   Indications: 1. 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 Chronic lower extremity pain (1ry area of Pain) (Left)   3. DDD (degenerative disc disease), lumbosacral   4. 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 Lumbosacral radiculopathy at L5 (Left)   7. Numbness and tingling of lower extremity (Left)   8. Abnormal MRI, lumbar spine (01/16/2020)    Pain Score: Pre-procedure: 3 /10 Post-procedure: 0-No pain/10   Pre-op H&P Assessment:  Mr. Sattar is a 70 y.o. (year old), male patient, seen today for interventional treatment. He  has a past surgical history that includes Neck surgery (2000 approx) and Spine surgery. Mr. Bouwens has a current medication list which includes the following prescription(s): atorvastatin, cyclobenzaprine, finasteride, tamsulosin, and tramadol, and the following Facility-Administered Medications: fentanyl and midazolam. His primarily concern today is the Back Pain  Initial Vital Signs:  Pulse/HCG Rate: (!) 104ECG Heart Rate: (!) 112 Temp: 98.2 F (36.8 C) Resp: 16 BP: (!) 145/92 SpO2: 96 %  BMI: Estimated body mass index is 26.78 kg/m as calculated from the following:   Height as of this encounter: 6\' 4"  (1.93 m).   Weight as of this encounter: 220 lb (99.8 kg).  Risk Assessment: Allergies: Reviewed. He has No Known Allergies.  Allergy Precautions: None required Coagulopathies: Reviewed. None identified.  Blood-thinner therapy: None at this time Active Infection(s): Reviewed. None identified. Mr. Mccort is afebrile  Site Confirmation: Mr. Martis was asked to confirm the procedure and laterality before marking the site Procedure checklist: Completed Consent: Before the procedure and under the influence of no sedative(s), amnesic(s), or anxiolytics, the patient was informed of the treatment options, risks and possible complications. To fulfill our ethical and legal obligations, as recommended by the American Medical Association's Code of Ethics, I have informed the patient of my clinical impression; the nature and purpose of the treatment or procedure; the risks, benefits, and possible complications of the  intervention; the alternatives, including doing nothing; the risk(s)  and benefit(s) of the alternative treatment(s) or procedure(s); and the risk(s) and benefit(s) of doing nothing. The patient was provided information about the general risks and possible complications associated with the procedure. These may include, but are not limited to: failure to achieve desired goals, infection, bleeding, organ or nerve damage, allergic reactions, paralysis, and death. In addition, the patient was informed of those risks and complications associated to Spine-related procedures, such as failure to decrease pain; infection (i.e.: Meningitis, epidural or intraspinal abscess); bleeding (i.e.: epidural hematoma, subarachnoid hemorrhage, or any other type of intraspinal or peri-dural bleeding); organ or nerve damage (i.e.: Any type of peripheral nerve, nerve root, or spinal cord injury) with subsequent damage to sensory, motor, and/or autonomic systems, resulting in permanent pain, numbness, and/or weakness of one or several areas of the body; allergic reactions; (i.e.: anaphylactic reaction); and/or death. Furthermore, the patient was informed of those risks and complications associated with the medications. These include, but are not limited to: allergic reactions (i.e.: anaphylactic or anaphylactoid reaction(s)); adrenal axis suppression; blood sugar elevation that in diabetics may result in ketoacidosis or comma; water retention that in patients with history of congestive heart failure may result in shortness of breath, pulmonary edema, and decompensation with resultant heart failure; weight gain; swelling or edema; medication-induced neural toxicity; particulate matter embolism and blood vessel occlusion with resultant organ, and/or nervous system infarction; and/or aseptic necrosis of one or more joints. Finally, the patient was informed that Medicine is not an exact science; therefore, there is also the possibility of  unforeseen or unpredictable risks and/or possible complications that may result in a catastrophic outcome. The patient indicated having understood very clearly. We have given the patient no guarantees and we have made no promises. Enough time was given to the patient to ask questions, all of which were answered to the patient's satisfaction. Mr. Tamblyn has indicated that he wanted to continue with the procedure. Attestation: I, the ordering provider, attest that I have discussed with the patient the benefits, risks, side-effects, alternatives, likelihood of achieving goals, and potential problems during recovery for the procedure that I have provided informed consent. Date  Time: 02/16/2020 10:42 AM  Pre-Procedure Preparation:  Monitoring: As per clinic protocol. Respiration, ETCO2, SpO2, BP, heart rate and rhythm monitor placed and checked for adequate function Safety Precautions: Patient was assessed for positional comfort and pressure points before starting the procedure. Time-out: I initiated and conducted the "Time-out" before starting the procedure, as per protocol. The patient was asked to participate by confirming the accuracy of the "Time Out" information. Verification of the correct person, site, and procedure were performed and confirmed by me, the nursing staff, and the patient. "Time-out" conducted as per Joint Commission's Universal Protocol (UP.01.01.01). Time: 1120  Description of Procedure #1:  Target Area: The inferior and lateral portion of the pedicle, just lateral to a line created by the 6:00 position of the pedicle and the superior articular process of the vertebral body below. On the lateral view, this target lies just posterior to the anterior aspect of the lamina and posterior to the midpoint created between the anterior and the posterior aspect of the neural foramina. Approach: Posterior paravertebral approach. Area Prepped: Entire Posterior Lumbosacral Region DuraPrep  (Iodine Povacrylex [0.7% available iodine] and Isopropyl Alcohol, 74% w/w) Safety Precautions: Aspiration looking for blood return was conducted prior to all injections. At no point did we inject any substances, as a needle was being advanced. No attempts were made at seeking any paresthesias. Safe  injection practices and needle disposal techniques used. Medications properly checked for expiration dates. SDV (single dose vial) medications used.  Description of the Procedure: Protocol guidelines were followed. The patient was placed in position over the fluoroscopy table. The target area was identified and the area prepped in the usual manner. Skin & deeper tissues infiltrated with local anesthetic. Appropriate amount of time allowed to pass for local anesthetics to take effect. The procedure needles were then advanced to the target area. Proper needle placement secured. Negative aspiration confirmed. Solution injected in intermittent fashion, asking for systemic symptoms every 0.2cc of injectate. The needles were then removed and the area cleansed, making sure to leave some of the prepping solution back to take advantage of its long term bactericidal properties.  Start Time: 1120 hrs.  Materials:  Needle(s) Type: Spinal Needle Gauge: 22G Length: 3.5-in Medication(s): Please see orders for medications and dosing details.  Description of Procedure #2:  Target Area: The  interlaminar space, initially targeting the lower border of the superior vertebral body lamina. Approach: Posterior paramedial approach. Area Prepped: Same as above Prepping solution: Same as above Safety Precautions: Same as above  Description of the Procedure: Protocol guidelines were followed. The patient was placed in position over the fluoroscopy table. The target area was identified and the area prepped in the usual manner. Skin & deeper tissues infiltrated with local anesthetic. Appropriate amount of time allowed to pass for  local anesthetics to take effect. The procedure needle was introduced through the skin, ipsilateral to the reported pain, and advanced to the target area. Bone was contacted and the needle walked caudad, until the lamina was cleared. The ligamentum flavum was engaged and loss-of-resistance technique used as the epidural needle was advanced. The epidural space was identified using "loss-of-resistance technique" with 2-3 ml of PF-NaCl (0.9% NSS), in a 5cc LOR glass syringe. Proper needle placement secured. Negative aspiration confirmed. Solution injected in intermittent fashion, asking for systemic symptoms every 0.5cc of injectate. The needles were then removed and the area cleansed, making sure to leave some of the prepping solution back to take advantage of its long term bactericidal properties.  Vitals:   02/16/20 1139 02/16/20 1149 02/16/20 1159 02/16/20 1209  BP: (!) 145/96 138/74 136/79 126/79  Pulse:    89  Resp: 18 16 16 18   Temp:  98 F (36.7 C)  98.1 F (36.7 C)  SpO2: 96% 99% 98% 100%  Weight:      Height:        End Time: 1138 hrs.  Materials:  Needle(s) Type: Epidural needle Gauge: 17G Length: 3.5-in Medication(s): Please see orders for medications and dosing details.  Imaging Guidance (Spinal):          Type of Imaging Technique: Fluoroscopy Guidance (Spinal) Indication(s): Assistance in needle guidance and placement for procedures requiring needle placement in or near specific anatomical locations not easily accessible without such assistance. Exposure Time: Please see nurses notes. Contrast: Before injecting any contrast, we confirmed that the patient did not have an allergy to iodine, shellfish, or radiological contrast. Once satisfactory needle placement was completed at the desired level, radiological contrast was injected. Contrast injected under live fluoroscopy. No contrast complications. See chart for type and volume of contrast used. Fluoroscopic Guidance: I was  personally present during the use of fluoroscopy. "Tunnel Vision Technique" used to obtain the best possible view of the target area. Parallax error corrected before commencing the procedure. "Direction-depth-direction" technique used to introduce the needle under continuous pulsed  fluoroscopy. Once target was reached, antero-posterior, oblique, and lateral fluoroscopic projection used confirm needle placement in all planes. Images permanently stored in EMR. Interpretation: I personally interpreted the imaging intraoperatively. Adequate needle placement confirmed in multiple planes. Appropriate spread of contrast into desired area was observed. No evidence of afferent or efferent intravascular uptake. No intrathecal or subarachnoid spread observed. Permanent images saved into the patient's record.  Antibiotic Prophylaxis:   Anti-infectives (From admission, onward)   None     Indication(s): None identified  Post-operative Assessment:  Post-procedure Vital Signs:  Pulse/HCG Rate: 8992 Temp: 98.1 F (36.7 C) Resp: 18 BP: 126/79 SpO2: 100 %  EBL: None  Complications: No immediate post-treatment complications observed by team, or reported by patient.  Note: The patient tolerated the entire procedure well. A repeat set of vitals were taken after the procedure and the patient was kept under observation following institutional policy, for this type of procedure. Post-procedural neurological assessment was performed, showing return to baseline, prior to discharge. The patient was provided with post-procedure discharge instructions, including a section on how to identify potential problems. Should any problems arise concerning this procedure, the patient was given instructions to immediately contact us, at any time, without hesitation. In any case, we plan to contact the patient by telephone for a follow-up status report regarding this interventional procedure.  Comments:  No additional relevant  information.  Plan of Care  Orders:  Orders Placed This Encounter  Procedures  . Lumbar Epidural Injection    Scheduling Instructions:     Procedure: Interlaminar LESI L3-4     Laterality: Left-sided     Sedation: Patient's choice     Timeframe:  Today    Order Specific Question:   Where will this procedure be performed?    Answer:   [facility]  . Lumbar Transforaminal Epidural    Scheduling Instructions:     Side: Left-sided     Level: L5     Sedation: Patient's choice.     Timeframe: Today    Order Specific Question:   Where will this procedure be performed?    Answer:   [facility]  . DG PAIN [facility] C-ARM 1-60 MIN NO REPORT    Intraoperative interpretation by procedural physician at [place].    Standing Status:   Standing    Number of Occurrences:   1    Order Specific Question:   Reason for exam:    Answer:   Assistance in needle guidance and placement for procedures requiring needle placement in or near specific anatomical locations not easily accessible without such assistance.  . Informed Consent Details: Physician/Practitioner Attestation; Transcribe to consent form and obtain patient signature    Note: Always confirm laterality of pain with Mr. Doom, before procedure. Transcribe to consent form and obtain patient signature.    Order Specific Question:   Physician/Practitioner attestation of informed consent for procedure/surgical case    Answer:   I, the physician/practitioner, attest that I have discussed with the patient the benefits, risks, side effects, alternatives, likelihood of achieving goals and potential problems during recovery for the procedure that I have provided informed consent.    Order Specific Question:   Procedure    Answer:   Lumbar epidural steroid injection under fluoroscopic guidance    Order Specific Question:   Physician/Practitioner performing the procedure    Answer:   Sherlon Nied A. Dossie Arbour, MD    Order  Specific Question:   Indication/Reason    Answer:  Low back and/or lower extremity pain secondary to lumbar radiculitis  . Informed Consent Details: Physician/Practitioner Attestation; Transcribe to consent form and obtain patient signature    Provider Attestation: I, [place] Dossie Arbour, MD, (Pain Management Specialist), the physician/practitioner, attest that I have discussed with the patient the benefits, risks, side effects, alternatives, likelihood of achieving goals and potential problems during recovery for the procedure that I have provided informed consent.    Scheduling Instructions:     Note: Always confirm laterality of pain with Mr. Bearce, before procedure.     Transcribe to consent form and obtain patient signature.    Order Specific Question:   Physician/Practitioner attestation of informed consent for procedure/surgical case    Answer:   I, the physician/practitioner, attest that I have discussed with the patient the benefits, risks, side effects, alternatives, likelihood of achieving goals and potential problems during recovery for the procedure that I have provided informed consent.    Order Specific Question:   Procedure    Answer:   Diagnostic lumbar transforaminal epidural steroid injection under fluoroscopic guidance. (See notes for level and laterality.)    Order Specific Question:   Physician/Practitioner performing the procedure    Answer:   Youlanda Tomassetti A. Dossie Arbour, MD    Order Specific Question:   Indication/Reason    Answer:   Lumbar radiculopathy/radiculitis associated with lumbar stenosis  . Provide equipment / supplies at bedside    "Epidural Tray" (Disposable  single use) Catheter: NOT required    Standing Status:   Standing    Number of Occurrences:   1    Order Specific Question:   Specify    Answer:   Epidural Tray   Chronic Opioid Analgesic:  Tramadol 50 mg, half a tablet p.o. daily MME/day: 2.5 mg/day   Medications ordered for procedure: Meds ordered  this encounter  Medications  . iohexol (OMNIPAQUE) 180 MG/ML injection 10 mL    Must be Myelogram-compatible. If not available, you may substitute with a water-soluble, non-ionic, hypoallergenic, myelogram-compatible radiological contrast medium.  Marland Kitchen lidocaine (XYLOCAINE) 2 % (with pres) injection 400 mg  . lactated ringers infusion 1,000 mL  . midazolam (VERSED) 5 MG/5ML injection 1-2 mg    Make sure Flumazenil is available in the pyxis when using this medication. If oversedation occurs, administer 0.2 mg IV over 15 sec. If after 45 sec no response, administer 0.2 mg again over 1 min; may repeat at 1 min intervals; not to exceed 4 doses (1 mg)  . fentaNYL (SUBLIMAZE) injection 25-50 mcg    Make sure Narcan is available in the pyxis when using this medication. In the event of respiratory depression (RR< 8/min): Titrate NARCAN (naloxone) in increments of 0.1 to 0.2 mg IV at 2-3 minute intervals, until desired degree of reversal.  . sodium chloride flush (NS) 0.9 % injection 2 mL  . ropivacaine (PF) 2 mg/mL (0.2%) (NAROPIN) injection 2 mL  . triamcinolone acetonide (KENALOG-40) injection 40 mg  . sodium chloride flush (NS) 0.9 % injection 1 mL  . ropivacaine (PF) 2 mg/mL (0.2%) (NAROPIN) injection 1 mL  . dexamethasone (DECADRON) injection 10 mg   Medications administered: We administered iohexol, lidocaine, lactated ringers, midazolam, fentaNYL, sodium chloride flush, ropivacaine (PF) 2 mg/mL (0.2%), triamcinolone acetonide, sodium chloride flush, ropivacaine (PF) 2 mg/mL (0.2%), and dexamethasone.  See the medical record for exact dosing, route, and time of administration.  Follow-up plan:   Return in about 2 weeks (around 03/01/2020) for (F2F), (PP) Follow-up.  Interventional Therapies  Risk  Complexity Considerations:   WNL   Planned  Pending:   Diagnostic/therapeutic left L3-4 LESI #1 + left L5 TFESI #1  (today-Tuesday February 16, 2020)   Under consideration:    Diagnostic/therapeutic left L3-4 LESI #1 + left L5 TFESI #1  Diagnostic left lumbar facet block #1    Completed:   None at this time   Therapeutic  Palliative (PRN) options:   None established    Recent Visits Date Type Provider Dept  02/15/20 Office Visit Milinda Pointer, MD [facility]  Showing recent visits within past 90 days and meeting all other requirements Today's Visits Date Type Provider Dept  02/16/20 Procedure visit Milinda Pointer, MD [facility]  Showing today's visits and meeting all other requirements Future Appointments Date Type Provider Dept  03/01/20 Appointment Milinda Pointer, MD [facility]  Showing future appointments within next 90 days and meeting all other requirements  Disposition: Discharge home  Discharge (Date  Time): 02/16/2020; 1210 hrs.   Primary Care Physician: Daun Peacock, NP Location: [facility] Note by: Gaspar Cola, MD Date: 02/16/2020; Time: 2:23 PM  Disclaimer:  Medicine is not an Chief Strategy Officer. The only guarantee in medicine is that nothing is guaranteed. It is important to note that the decision to proceed with this intervention was based on the information collected from the patient. The Data and conclusions were drawn from the patient's questionnaire, the interview, and the physical examination. Because the information was provided in large part by the patient, it cannot be guaranteed that it has not been purposely or unconsciously manipulated. Every effort has been made to obtain as much relevant data as possible for this evaluation. It is important to note that the conclusions that lead to this procedure are derived in large part from the available data. Always take into account that the treatment will also be dependent on availability of resources and existing treatment guidelines, considered by other Pain Management Practitioners as being common knowledge  and practice, at the time of the intervention. For Medico-Legal purposes, it is also important to point out that variation in procedural techniques and pharmacological choices are the acceptable norm. The indications, contraindications, technique, and results of the above procedure should only be interpreted and judged by a Board-Certified Interventional Pain Specialist with extensive familiarity and expertise in the same exact procedure and technique.

## 2020-02-16 NOTE — Patient Instructions (Addendum)

## 2020-02-16 NOTE — Progress Notes (Signed)
Safety precautions to be maintained throughout the outpatient stay will include: orient to surroundings, keep bed in low position, maintain call bell within reach at all times, provide assistance with transfer out of bed and ambulation.  

## 2020-02-17 ENCOUNTER — Telehealth: Payer: Self-pay

## 2020-02-17 NOTE — Telephone Encounter (Signed)
Post procedure follow up phone call.  LM 

## 2020-02-21 LAB — COMP. METABOLIC PANEL (12)
AST: 22 IU/L (ref 0–40)
Albumin/Globulin Ratio: 1.6 (ref 1.2–2.2)
Albumin: 3.9 g/dL (ref 3.8–4.8)
Alkaline Phosphatase: 116 IU/L (ref 44–121)
BUN/Creatinine Ratio: 18 (ref 10–24)
BUN: 23 mg/dL (ref 8–27)
Bilirubin Total: 0.4 mg/dL (ref 0.0–1.2)
Calcium: 9.2 mg/dL (ref 8.6–10.2)
Chloride: 100 mmol/L (ref 96–106)
Creatinine, Ser: 1.31 mg/dL — ABNORMAL HIGH (ref 0.76–1.27)
GFR calc Af Amer: 64 mL/min/{1.73_m2} (ref >59)
GFR calc non Af Amer: 55 mL/min/{1.73_m2} — ABNORMAL LOW (ref >59)
Globulin, Total: 2.4 g/dL (ref 1.5–4.5)
Glucose: 95 mg/dL (ref 65–99)
Potassium: 5.1 mmol/L (ref 3.5–5.2)
Sodium: 138 mmol/L (ref 134–144)
Total Protein: 6.3 g/dL (ref 6.0–8.5)

## 2020-02-21 LAB — VITAMIN B12: Vitamin B-12: 1124 pg/mL (ref 232–1245)

## 2020-02-21 LAB — C-REACTIVE PROTEIN: CRP: 9 mg/L (ref 0–10)

## 2020-02-21 LAB — MAGNESIUM: Magnesium: 2.3 mg/dL (ref 1.6–2.3)

## 2020-02-21 LAB — 25-HYDROXY VITAMIN D LCMS D2+D3
25-Hydroxy, Vitamin D-2: <1 ng/mL
25-Hydroxy, Vitamin D-3: 37 ng/mL
25-Hydroxy, Vitamin D: 37 ng/mL

## 2020-02-21 LAB — SEDIMENTATION RATE: Sed Rate: 19 mm/hr (ref 0–30)

## 2020-02-23 LAB — COMPLIANCE DRUG ANALYSIS, UR

## 2020-02-25 ENCOUNTER — Ambulatory Visit: Payer: Medicare Other | Admitting: Pain Medicine

## 2020-02-26 ENCOUNTER — Telehealth: Payer: Self-pay | Admitting: Pain Medicine

## 2020-02-26 NOTE — Telephone Encounter (Signed)
Patient is still having pain issues even after his procedure. He is scheduled for F2F PPE on Tues. Has a 2hr drive. Wants to know can he talk to Dr. Dossie Arbour. He would like to avoid 2 long trips if he can.

## 2020-02-26 NOTE — Telephone Encounter (Signed)
Okayed with Juliann Pulse to change to VV and sent Dr Dossie Arbour a bubble about the change.

## 2020-03-01 ENCOUNTER — Encounter: Payer: Self-pay | Admitting: Pain Medicine

## 2020-03-01 ENCOUNTER — Ambulatory Visit
Admission: RE | Admit: 2020-03-01 | Discharge: 2020-03-01 | Disposition: A | Discharge status: Home / Self Care | Payer: Medicare Other | Source: Ambulatory Visit | Attending: Pain Medicine | Admitting: Pain Medicine

## 2020-03-01 ENCOUNTER — Ambulatory Visit (HOSPITAL_BASED_OUTPATIENT_CLINIC_OR_DEPARTMENT_OTHER): Payer: Medicare Other | Admitting: Pain Medicine

## 2020-03-01 ENCOUNTER — Telehealth: Payer: Self-pay | Admitting: *Deleted

## 2020-03-01 ENCOUNTER — Encounter: Payer: Self-pay | Admitting: Emergency Medicine

## 2020-03-01 ENCOUNTER — Ambulatory Visit
Admission: RE | Admit: 2020-03-01 | Discharge: 2020-03-01 | Disposition: A | Discharge status: Home / Self Care | Payer: Medicare Other | Source: Home / Self Care | Attending: Pain Medicine | Admitting: Pain Medicine

## 2020-03-01 ENCOUNTER — Emergency Department
Admission: EM | Admit: 2020-03-01 | Discharge: 2020-03-01 | Disposition: A | Discharge status: Home / Self Care | Payer: Medicare Other | Attending: Emergency Medicine | Admitting: Emergency Medicine

## 2020-03-01 ENCOUNTER — Other Ambulatory Visit: Payer: Self-pay

## 2020-03-01 VITALS — BP 140/97 | HR 114 | Temp 97.3°F | Resp 16 | Ht 76.0 in | Wt 220.0 lb

## 2020-03-01 DIAGNOSIS — M48062 Spinal stenosis, lumbar region with neurogenic claudication: Secondary | ICD-10-CM | POA: Insufficient documentation

## 2020-03-01 DIAGNOSIS — M48061 Spinal stenosis, lumbar region without neurogenic claudication: Secondary | ICD-10-CM

## 2020-03-01 DIAGNOSIS — Z7689 Persons encountering health services in other specified circumstances: Secondary | ICD-10-CM | POA: Insufficient documentation

## 2020-03-01 DIAGNOSIS — M2428 Disorder of ligament, vertebrae: Secondary | ICD-10-CM | POA: Insufficient documentation

## 2020-03-01 DIAGNOSIS — R0989 Other specified symptoms and signs involving the circulatory and respiratory systems: Secondary | ICD-10-CM | POA: Insufficient documentation

## 2020-03-01 DIAGNOSIS — M25461 Effusion, right knee: Secondary | ICD-10-CM | POA: Insufficient documentation

## 2020-03-01 DIAGNOSIS — R200 Anesthesia of skin: Secondary | ICD-10-CM | POA: Insufficient documentation

## 2020-03-01 DIAGNOSIS — M25561 Pain in right knee: Secondary | ICD-10-CM

## 2020-03-01 DIAGNOSIS — M5442 Lumbago with sciatica, left side: Secondary | ICD-10-CM | POA: Insufficient documentation

## 2020-03-01 DIAGNOSIS — M1711 Unilateral primary osteoarthritis, right knee: Secondary | ICD-10-CM | POA: Insufficient documentation

## 2020-03-01 DIAGNOSIS — Z87891 Personal history of nicotine dependence: Secondary | ICD-10-CM | POA: Diagnosis not present

## 2020-03-01 DIAGNOSIS — M549 Dorsalgia, unspecified: Secondary | ICD-10-CM | POA: Insufficient documentation

## 2020-03-01 DIAGNOSIS — Z79899 Other long term (current) drug therapy: Secondary | ICD-10-CM | POA: Diagnosis not present

## 2020-03-01 DIAGNOSIS — M160 Bilateral primary osteoarthritis of hip: Secondary | ICD-10-CM | POA: Insufficient documentation

## 2020-03-01 DIAGNOSIS — M25552 Pain in left hip: Secondary | ICD-10-CM | POA: Insufficient documentation

## 2020-03-01 DIAGNOSIS — R937 Abnormal findings on diagnostic imaging of other parts of musculoskeletal system: Secondary | ICD-10-CM

## 2020-03-01 DIAGNOSIS — G8929 Other chronic pain: Secondary | ICD-10-CM | POA: Insufficient documentation

## 2020-03-01 DIAGNOSIS — M47816 Spondylosis without myelopathy or radiculopathy, lumbar region: Secondary | ICD-10-CM | POA: Insufficient documentation

## 2020-03-01 DIAGNOSIS — M5417 Radiculopathy, lumbosacral region: Secondary | ICD-10-CM

## 2020-03-01 DIAGNOSIS — M4316 Spondylolisthesis, lumbar region: Secondary | ICD-10-CM | POA: Insufficient documentation

## 2020-03-01 DIAGNOSIS — M79605 Pain in left leg: Secondary | ICD-10-CM

## 2020-03-01 DIAGNOSIS — M5137 Other intervertebral disc degeneration, lumbosacral region: Secondary | ICD-10-CM | POA: Insufficient documentation

## 2020-03-01 DIAGNOSIS — M51379 Other intervertebral disc degeneration, lumbosacral region without mention of lumbar back pain or lower extremity pain: Secondary | ICD-10-CM

## 2020-03-01 DIAGNOSIS — R202 Paresthesia of skin: Secondary | ICD-10-CM

## 2020-03-01 DIAGNOSIS — M47817 Spondylosis without myelopathy or radiculopathy, lumbosacral region: Secondary | ICD-10-CM

## 2020-03-01 DIAGNOSIS — M545 Low back pain, unspecified: Secondary | ICD-10-CM | POA: Insufficient documentation

## 2020-03-01 DIAGNOSIS — I824Y1 Acute embolism and thrombosis of unspecified deep veins of right proximal lower extremity: Secondary | ICD-10-CM

## 2020-03-01 LAB — CBC WITH DIFFERENTIAL/PLATELET
Abs Immature Granulocytes: 0.15 10*3/uL — ABNORMAL HIGH (ref 0.00–0.07)
Basophils Absolute: 0.1 10*3/uL (ref 0.0–0.1)
Basophils Relative: 1 %
Eosinophils Absolute: 0.4 10*3/uL (ref 0.0–0.5)
Eosinophils Relative: 3 %
HCT: 42.4 % (ref 39.0–52.0)
Hemoglobin: 14 g/dL (ref 13.0–17.0)
Immature Granulocytes: 1 %
Lymphocytes Relative: 9 %
Lymphs Abs: 1.2 10*3/uL (ref 0.7–4.0)
MCH: 30.8 pg (ref 26.0–34.0)
MCHC: 33 g/dL (ref 30.0–36.0)
MCV: 93.2 fL (ref 80.0–100.0)
Monocytes Absolute: 1.2 10*3/uL — ABNORMAL HIGH (ref 0.1–1.0)
Monocytes Relative: 9 %
Neutro Abs: 10.1 10*3/uL — ABNORMAL HIGH (ref 1.7–7.7)
Neutrophils Relative %: 77 %
Platelets: 198 10*3/uL (ref 150–400)
RBC: 4.55 MIL/uL (ref 4.22–5.81)
RDW: 13.1 % (ref 11.5–15.5)
WBC: 13.2 10*3/uL — ABNORMAL HIGH (ref 4.0–10.5)
nRBC: 0 % (ref 0.0–0.2)

## 2020-03-01 LAB — HEPATIC FUNCTION PANEL
ALT: 77 U/L — ABNORMAL HIGH (ref 0–44)
AST: 45 U/L — ABNORMAL HIGH (ref 15–41)
Albumin: 3.6 g/dL (ref 3.5–5.0)
Alkaline Phosphatase: 161 U/L — ABNORMAL HIGH (ref 38–126)
Bilirubin, Direct: 0.2 mg/dL (ref 0.0–0.2)
Indirect Bilirubin: 0.9 mg/dL (ref 0.3–0.9)
Total Bilirubin: 1.1 mg/dL (ref 0.3–1.2)
Total Protein: 7 g/dL (ref 6.5–8.1)

## 2020-03-01 LAB — BASIC METABOLIC PANEL
Anion gap: 11 (ref 5–15)
BUN: 26 mg/dL — ABNORMAL HIGH (ref 8–23)
CO2: 25 mmol/L (ref 22–32)
Calcium: 8.9 mg/dL (ref 8.9–10.3)
Chloride: 100 mmol/L (ref 98–111)
Creatinine, Ser: 1.24 mg/dL (ref 0.61–1.24)
GFR, Estimated: >60 mL/min (ref >60)
Glucose, Bld: 101 mg/dL — ABNORMAL HIGH (ref 70–99)
Potassium: 4.3 mmol/L (ref 3.5–5.1)
Sodium: 136 mmol/L (ref 135–145)

## 2020-03-01 LAB — PROTIME-INR
INR: 1.1 (ref 0.8–1.2)
Prothrombin Time: 13.8 seconds (ref 11.4–15.2)

## 2020-03-01 MED ORDER — RIVAROXABAN 15 MG PO TABS
15.0000 mg | ORAL_TABLET | Freq: Once | ORAL | Status: AC
  Administered 2020-03-01: 15 mg via ORAL
  Filled 2020-03-01: qty 1

## 2020-03-01 MED ORDER — RIVAROXABAN (XARELTO) VTE STARTER PACK (15 & 20 MG)
ORAL_TABLET | ORAL | 0 refills | Status: DC
Start: 2020-03-01 — End: 2021-05-09

## 2020-03-01 NOTE — Progress Notes (Signed)
Safety precautions to be maintained throughout the outpatient stay will include: orient to surroundings, keep bed in low position, maintain call bell within reach at all times, provide assistance with transfer out of bed and ambulation.  

## 2020-03-01 NOTE — ED Triage Notes (Signed)
Pt brought from [place] with confirmed DVT. RN states they called ahead and spoke with Stage manager. Pt taken back to triage.

## 2020-03-01 NOTE — Telephone Encounter (Signed)
Attempted to call for pre appointment review of allergies/meds. Message left. 

## 2020-03-01 NOTE — ED Provider Notes (Signed)
[facility] Emergency Department Provider Note   ____________________________________________   Event Date/Time   First MD Initiated Contact with Patient 03/01/20 1556     (approximate)  I have reviewed the triage vital signs and the nursing notes.   HISTORY  Chief Complaint RLE DVT    HPI Todd Bradford is a 70 y.o. male history of high cholesterol and bad arthritis  Patient reports that for several months has had numbness in his feet primarily in the left slightly on the right as well thought due to back pain and sciatica. He also on Thursday of this last week he noticed his right leg seem to begin a little bit swollen. Its not really painful but he reports its achy and feels a little swollen. No new numbness. No cold or blue foot. He went and saw his pain medicine doctor today and was scheduled for an ultrasound and then was told he had a blood clot.  He has never had a blood clot before. No history of PE or DVT. He does not take any blood thinners, only uses ibuprofen. Does not take any aspirin or Plavix.   Past Medical History:  Diagnosis Date  . Hyperlipidemia     Patient Active Problem List   Diagnosis Date Noted  . Ligamentum flavum hypertrophy 03/01/2020  . Chronic low back pain (Left) w/o sciatica 03/01/2020  . Homans sign present 03/01/2020  . Encounter for assessment for deep vein thrombosis (DVT) 03/01/2020  . Arthropathy of right knee 03/01/2020  . Pain and swelling of knee, right 03/01/2020  . Osteoarthritis of hips (Bilateral) 03/01/2020  . Anterolisthesis of lumbar spine L4/L5 (83mm) 03/01/2020  . Chronic lower extremity pain (1ry area of Pain) (Left) 02/15/2020  . Lumbosacral radiculopathy at L5 (Left) 02/15/2020  . Numbness and tingling of lower extremity (Left) 02/15/2020  . DDD (degenerative disc disease), lumbosacral 02/15/2020  . DDD (degenerative disc disease), lumbar 02/15/2020  . Chronic hip pain (Left) 02/15/2020  .  Uncomplicated opioid dependence ([place]) 02/15/2020  . Abnormal MRI, lumbar spine (01/16/2020) 02/15/2020  . 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 Lumbar facet hypertrophy (Multilevel) (Bilateral) 02/15/2020  . Lumbosacral facet syndrome (Left) 02/15/2020  . 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 Chronic pain syndrome 02/14/2020  . Pharmacologic therapy 02/14/2020  . Disorder of skeletal system 02/14/2020  . Problems influencing health status 02/14/2020    Past Surgical History:  Procedure Laterality Date  . NECK SURGERY  2000 approx   Fusion of C3-4  . SPINE SURGERY      Prior to Admission medications   Medication Sig Start Date End Date Taking? Authorizing Provider  RIVAROXABAN Alveda Reasons) VTE STARTER PACK (15 & 20 MG) Follow package directions: Take one 15mg  tablet by mouth twice a day. On day 22, switch to one 20mg  tablet once a day. Take with food. 03/01/20  Yes Delman Kitten, MD  atorvastatin (LIPITOR) 40 MG tablet Take 40 mg by mouth at bedtime. 01/12/20   [provider]  cyclobenzaprine (FLEXERIL) 5 MG tablet Take 5 mg by mouth 3 (three) times daily as needed. 01/15/20   [provider]  finasteride (PROSCAR) 5 MG tablet Take 5 mg by mouth daily. 01/12/20   [provider]  tamsulosin (FLOMAX) 0.4 MG CAPS capsule Take 0.8 mg by mouth daily. 01/12/20   [provider]  traMADol (ULTRAM) 50 MG  tablet Take 50 mg by mouth 2 (two) times daily as needed. 02/09/20   [provider]    No history of frequent falls. No history of bleeding ulcers. No history of any major bleeding problems.  Allergies Patient has no known allergies.  Family History  Problem Relation Age of Onset  . Heart disease Mother   . Stroke Father     Social History Social History   Tobacco Use  . Smoking status: Former Smoker     Packs/day: 2.00    Years: 30.00    Pack years: 60.00    Types: Cigarettes    Quit date: 2007    Years since quitting: 15.1  . Smokeless tobacco: Never Used  Vaping Use  . Vaping Use: Never used    Review of Systems Constitutional: No fever/chills Eyes: No visual changes. ENT: No sore throat. Cardiovascular: Denies chest pain. Respiratory: Denies shortness of breath. Gastrointestinal: No abdominal pain.   Genitourinary: Negative for dysuria. Musculoskeletal: Lots of chronic lower back pain. Sees pain medicine for this. Also chronic numbness in the left leg as well as some in the right due to degenerative disc disease and slipped disc in his back. No new symptoms except for noted as in HPI including slight swelling the right leg and achiness of the right knee and calf Skin: Negative for rash. Neurological: Negative for headaches, areas of focal weakness or new numbness.    ____________________________________________   PHYSICAL EXAM:  VITAL SIGNS: ED Triage Vitals  Enc Vitals Group     BP 03/01/20 1445 137/85     Pulse Rate 03/01/20 1445 (!) 116     Resp 03/01/20 1445 18     Temp 03/01/20 1445 98.3 F (36.8 C)     Temp Source 03/01/20 1445 Oral     SpO2 03/01/20 1445 97 %     Weight 03/01/20 1448 220 lb (99.8 kg)     Height --      Head Circumference --      Peak Flow --      Pain Score --      Pain Loc --      Pain Edu? --      Excl. in [place]? --     Constitutional: Alert and oriented. Well appearing and in no acute distress. Seated in hallway very pleasant. Eyes: Conjunctivae are normal. Head: Atraumatic. Nose: No congestion/rhinnorhea. Mouth/Throat: Mucous membranes are moist. Neck: No stridor.  Cardiovascular: Just minimally elevated rate, regular rhythm. Grossly normal heart sounds.  Good peripheral circulation. Respiratory: Normal respiratory effort.  No retractions. Lungs CTAB. Gastrointestinal: Soft and nontender. No distention. Musculoskeletal: No lower  extremity tenderness nor edema noted on the left with strong palpable pulses on the left leg. Patient has slight edema overlying the right foot and right lower leg from about the knee down. He has dopplerable and strong dorsalis pedis and posterior tibial pulses on the right foot. Capillary refill approximately 2 seconds to the right foot as well as the left foot. He reports slight tingling and loss of sensation across the top of the right foot along the back of the right calf but reports this seems to be chronic and is thought due to back pain. Not a new issue. Demonstrates good use of the right lower extremity range of motion. There is no venous cords or congestion. Neurologic:  Normal speech and language. No gross focal neurologic deficits are appreciated.  Skin:  Skin is warm, dry and intact. No rash noted.  Psychiatric: Mood and affect are normal. Speech and behavior are normal.  ____________________________________________   LABS (all labs ordered are listed, but only abnormal results are displayed)  Labs Reviewed  CBC WITH DIFFERENTIAL/PLATELET - Abnormal; Notable for the following components:      Result Value   WBC 13.2 (*)    Neutro Abs 10.1 (*)    Monocytes Absolute 1.2 (*)    Abs Immature Granulocytes 0.15 (*)    All other components within normal limits  BASIC METABOLIC PANEL - Abnormal; Notable for the following components:   Glucose, Bld 101 (*)    BUN 26 (*)    All other components within normal limits  HEPATIC FUNCTION PANEL - Abnormal; Notable for the following components:   AST 45 (*)    ALT 77 (*)    Alkaline Phosphatase 161 (*)    All other components within normal limits  PROTIME-INR   ____________________________________________  EKG   ____________________________________________  RADIOLOGY  US Venous Img Lower Unilateral Right (DVT)  Result Date: 03/01/2020 CLINICAL DATA:  70 year old male with right leg pain and swelling after recent fall. EXAM: RIGHT  LOWER EXTREMITY VENOUS DOPPLER ULTRASOUND TECHNIQUE: Gray-scale sonography with graded compression, as well as color Doppler and duplex ultrasound were performed to evaluate the right lower extremity deep venous systems from the level of the common femoral vein and including the common femoral, femoral, profunda femoral, popliteal and calf veins including the posterior tibial, peroneal and gastrocnemius veins when visible. Spectral Doppler was utilized to evaluate flow at rest and with distal augmentation maneuvers in the common femoral, femoral and popliteal veins. The contralateral common femoral vein was also evaluated for comparison. COMPARISON:  None. FINDINGS: RIGHT LOWER EXTREMITY Common Femoral Vein: No evidence of thrombus. Normal compressibility, respiratory phasicity and response to augmentation. Central Greater Saphenous Vein: No evidence of thrombus. Normal compressibility and flow on color Doppler imaging. Central Profunda Femoral Vein: No evidence of thrombus. Normal compressibility and flow on color Doppler imaging. Femoral Vein: Occlusive thrombus. Popliteal Vein: Occlusive thrombus. Calf Veins: Occlusive thrombus. Other Findings: Simple appearing fluid collection in the right popliteal fossa measuring approximately 2.6 x 0.9 x 1.9 cm. LEFT LOWER EXTREMITY Common Femoral Vein: No evidence of thrombus. Normal compressibility, respiratory phasicity and response to augmentation. IMPRESSION: 1. Acute appearing, occlusive deep vein thrombosis extending from the central aspect of the superficial femoral vein into the calf veins. 2. Incidentally noted right Baker cyst measuring up to 2.6 cm. Ruthann Cancer, MD [facility] Radiology Electronically Signed   By: Ruthann Cancer MD   On: 03/01/2020 14:27    Acute occlusive deep vein thrombosis on the right ____________________________________________   PROCEDURES  Procedure(s) performed:  None  Procedures  Critical Care performed: No  ____________________________________________   INITIAL IMPRESSION / ASSESSMENT AND PLAN / ED COURSE  Pertinent labs & imaging results that were available during my care of the patient were reviewed by me and considered in my medical decision making (see chart for details).   Patient presents for evaluation of right leg swelling. Found to have a DVT on ultrasound. Will place on anticoagulation Xarelto. Discussed this with the patient discussed risks benefits as well as side effects and we also discussed injury precautions especially around head injury and the need to present to a local ER right away if he suffers any sort of significant head injury or other trauma. Patient understand agreeable with this. Bleeding precautions discussed. He is nontender mixes with any sort  of other blood thinner or aspirin  He is neurovascularly intact with good arterial exam and no ischemia involving the extremities including the right leg. He appears appropriate for ongoing outpatient treatment for DVT. He was advised to follow-up with both vascular surgery and his primary care doctor and he is in agreement with this plan.      ____________________________________________   FINAL CLINICAL IMPRESSION(S) / ED DIAGNOSES  Final diagnoses:  Acute deep vein thrombosis (DVT) of proximal vein of right lower extremity ([place])        Note:  This document was prepared using Dragon voice recognition software and may include unintentional dictation errors       Delman Kitten, MD 03/01/20 1626

## 2020-03-01 NOTE — ED Notes (Signed)
See first nurse note; pt denies any cp or sob

## 2020-03-01 NOTE — Patient Instructions (Signed)
____________________________________________________________________________________________  Preparing for Procedure with Sedation  Procedure appointments are limited to planned procedures: . No Prescription Refills. . No disability issues will be discussed. . No medication changes will be discussed.  Instructions: . Oral Intake: Do not eat or drink anything for at least 8 hours prior to your procedure. (Exception: Blood Pressure Medication. See below.) . Transportation: Unless otherwise stated by your physician, you may drive yourself after the procedure. . Blood Pressure Medicine: Do not forget to take your blood pressure medicine with a sip of water the morning of the procedure. If your Diastolic (lower reading)is above 100 mmHg, elective cases will be cancelled/rescheduled. . Blood thinners: These will need to be stopped for procedures. Notify our staff if you are taking any blood thinners. Depending on which one you take, there will be specific instructions on how and when to stop it. . Diabetics on insulin: Notify the staff so that you can be scheduled 1st case in the morning. If your diabetes requires high dose insulin, take only  of your normal insulin dose the morning of the procedure and notify the staff that you have done so. . Preventing infections: Shower with an antibacterial soap the morning of your procedure. . Build-up your immune system: Take 1000 mg of Vitamin C with every meal (3 times a day) the day prior to your procedure. . Antibiotics: Inform the staff if you have a condition or reason that requires you to take antibiotics before dental procedures. . Pregnancy: If you are pregnant, call and cancel the procedure. . Sickness: If you have a cold, fever, or any active infections, call and cancel the procedure. . Arrival: You must be in the facility at least 30 minutes prior to your scheduled procedure. . Children: Do not bring children with you. . Dress appropriately:  Bring dark clothing that you would not mind if they get stained. . Valuables: Do not bring any jewelry or valuables.  Reasons to call and reschedule or cancel your procedure: (Following these recommendations will minimize the risk of a serious complication.) . Surgeries: Avoid having procedures within 2 weeks of any surgery. (Avoid for 2 weeks before or after any surgery). . Flu Shots: Avoid having procedures within 2 weeks of a flu shots or . (Avoid for 2 weeks before or after immunizations). . Barium: Avoid having a procedure within 7-10 days after having had a radiological study involving the use of radiological contrast. (Myelograms, Barium swallow or enema study). . Heart attacks: Avoid any elective procedures or surgeries for the initial 6 months after a "Myocardial Infarction" (Heart Attack). . Blood thinners: It is imperative that you stop these medications before procedures. Let us know if you if you take any blood thinner.  . Infection: Avoid procedures during or within two weeks of an infection (including chest colds or gastrointestinal problems). Symptoms associated with infections include: Localized redness, fever, chills, night sweats or profuse sweating, burning sensation when voiding, cough, congestion, stuffiness, runny nose, sore throat, diarrhea, nausea, vomiting, cold or Flu symptoms, recent or current infections. It is specially important if the infection is over the area that we intend to treat. . Heart and lung problems: Symptoms that may suggest an active cardiopulmonary problem include: cough, chest pain, breathing difficulties or shortness of breath, dizziness, ankle swelling, uncontrolled high or unusually low blood pressure, and/or palpitations. If you are experiencing any of these symptoms, cancel your procedure and contact your primary care physician for an evaluation.  Remember:  Regular Business hours are:    Monday to Thursday 8:00 AM to 4:00 PM  Provider's  Schedule: Yatzary Merriweather, MD:  Procedure days: Tuesday and Thursday 7:30 AM to 4:00 PM  Bilal Lateef, MD:  Procedure days: Monday and Wednesday 7:30 AM to 4:00 PM ____________________________________________________________________________________________   ____________________________________________________________________________________________  General Risks and Possible Complications  Patient Responsibilities: It is important that you read this as it is part of your informed consent. It is our duty to inform you of the risks and possible complications associated with treatments offered to you. It is your responsibility as a patient to read this and to ask questions about anything that is not clear or that you believe was not covered in this document.  Patient's Rights: You have the right to refuse treatment. You also have the right to change your mind, even after initially having agreed to have the treatment done. However, under this last option, if you wait until the last second to change your mind, you may be charged for the materials used up to that point.  Introduction: Medicine is not an exact science. Everything in Medicine, including the lack of treatment(s), carries the potential for danger, harm, or loss (which is by definition: Risk). In Medicine, a complication is a secondary problem, condition, or disease that can aggravate an already existing one. All treatments carry the risk of possible complications. The fact that a side effects or complications occurs, does not imply that the treatment was conducted incorrectly. It must be clearly understood that these can happen even when everything is done following the highest safety standards.  No treatment: You can choose not to proceed with the proposed treatment alternative. The "PRO(s)" would include: avoiding the risk of complications associated with the therapy. The "CON(s)" would include: not getting any of the treatment  benefits. These benefits fall under one of three categories: diagnostic; therapeutic; and/or palliative. Diagnostic benefits include: getting information which can ultimately lead to improvement of the disease or symptom(s). Therapeutic benefits are those associated with the successful treatment of the disease. Finally, palliative benefits are those related to the decrease of the primary symptoms, without necessarily curing the condition (example: decreasing the pain from a flare-up of a chronic condition, such as incurable terminal cancer).  General Risks and Complications: These are associated to most interventional treatments. They can occur alone, or in combination. They fall under one of the following six (6) categories: no benefit or worsening of symptoms; bleeding; infection; nerve damage; allergic reactions; and/or death. 1. No benefits or worsening of symptoms: In Medicine there are no guarantees, only probabilities. No healthcare provider can ever guarantee that a medical treatment will work, they can only state the probability that it may. Furthermore, there is always the possibility that the condition may worsen, either directly, or indirectly, as a consequence of the treatment. 2. Bleeding: This is more common if the patient is taking a blood thinner, either prescription or over the counter (example: Goody Powders, Fish oil, Aspirin, Garlic, etc.), or if suffering a condition associated with impaired coagulation (example: Hemophilia, cirrhosis of the liver, low platelet counts, etc.). However, even if you do not have one on these, it can still happen. If you have any of these conditions, or take one of these drugs, make sure to notify your treating physician. 3. Infection: This is more common in patients with a compromised immune system, either due to disease (example: diabetes, cancer, human immunodeficiency virus [HIV], etc.), or due to medications or treatments (example: therapies used to treat  cancer and   rheumatological diseases). However, even if you do not have one on these, it can still happen. If you have any of these conditions, or take one of these drugs, make sure to notify your treating physician. 4. Nerve Damage: This is more common when the treatment is an invasive one, but it can also happen with the use of medications, such as those used in the treatment of cancer. The damage can occur to small secondary nerves, or to large primary ones, such as those in the spinal cord and brain. This damage may be temporary or permanent and it may lead to impairments that can range from temporary numbness to permanent paralysis and/or brain death. 5. Allergic Reactions: Any time a substance or material comes in contact with our body, there is the possibility of an allergic reaction. These can range from a mild skin rash (contact dermatitis) to a severe systemic reaction (anaphylactic reaction), which can result in death. 6. Death: In general, any medical intervention can result in death, most of the time due to an unforeseen complication. ____________________________________________________________________________________________   

## 2020-03-01 NOTE — Progress Notes (Signed)
PROVIDER NOTE: Information contained herein reflects review and annotations entered in association with encounter. Interpretation of such information and data should be left to medically-trained personnel. Information provided to patient can be located elsewhere in the medical record under "Patient Instructions". Document created using STT-dictation technology, any transcriptional errors that may result from process are unintentional.    Patient: Todd Bradford  Service Category: E/M  Provider: Gaspar Cola, MD  DOB: 1950/11/02  DOS: 03/01/2020  Specialty: Interventional Pain Management  MRN: [id number]  Setting: Ambulatory outpatient  PCP: Daun Peacock, NP  Type: Established Patient    Referring Provider: Daun Peacock, NP  Location: Office  Delivery: Face-to-face     HPI  [name], a 70 y.o. year old male, is here today because of his Chronic left-sided low back pain with left-sided sciatica [M54.42, G89.29]. [name] primary complain today is Hip Pain (left) and Foot Pain Last encounter: My last encounter with him was on 02/26/2020. Pertinent problems: 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 Pain Assessment: Severity of Chronic pain is reported as  a 1 /10. Location: Hip Left/denies. Onset: More than a month ago. Quality: Aching,Tingling,Numbness,Discomfort. Timing: Intermittent (when walking). Modifying factor(s): nothing. Vitals:  height is '6\' 4"'  (1.93 m) and weight is 220 lb (99.8 kg). His temporal temperature is 97.3 F (36.3 C) (abnormal). His blood pressure is 140/97 (abnormal) and his pulse is 114 (abnormal). His respiration is 16 and oxygen saturation is 100%.   Reason for encounter: post-procedure assessment.  The patient returns to the clinic today after having had a diagnostic/therapeutic left L5 transforaminal ESI #1 + a left L3-4 LESI #1 under fluoroscopic guidance and IV sedation.  He refers having attained 90% relief of the pain for the duration of the local anesthetic which then started wearing off to wear he now has about a 65% ongoing relief of some of the back and leg pain.  However, he still has that when he stands for prolonged period of time he gets pain in the left lower back/buttocks area.  He is also still having some numbness in the lower extremities, but most of the pain is now gone and he can stand up straight, and ambulate without using a cane.  When he first came in he was unable to stand up straight.  The patient refers that he recently tripped and apparently twisted his right lower extremity and now he is having redness, and swelling of his right knee along with pain below the knee.  The patient's wife indicates that she is concerned about his lack of mobility and the possibility that he may be having a DVT.  He indicates that when he twisted his right leg, he did not have any immediate pain, but over the.  Of approximately 3 days, his knee swelled up and the pain intensified.  Today we will be ordering an x-ray of the swollen  knee.  In addition, physical exam today demonstrated a positive Bevelyn Buckles' sign on the right calf area and therefore I will be ordering an ultrasound of the lower extremity to rule out the possibility of  a DVT.  With regards to his left lower back pain, today he had a positive physical exam with left facet arthralgia on hyperextension on rotation as well as Lynelle Smoke maneuver.  The right side seems to be negative.  Patrick maneuver was positive on the left side for hip arthralgia and decreased range of motion of the hip joint.  However, the Patrick maneuver was negative bilaterally for SI joint pain.  He refers that while he sitting down he is not having any pain but when he stands up for a prolonged period time he begins to experience pain in his buttocks and left lower back.  Today we were able to reproduce his symptoms with the hyperextension rotation maneuver.  For this reason, we will be bringing him back for a diagnostic left lumbar facet medial branch block under fluoroscopic guidance and IV sedation.  As to the knee, I will be ordering x-rays of the knee to evaluate for any acute pathology.  Post-Procedure Evaluation  Procedure (02/26/2020): Diagnostic therapeutic left L5 TFESI #1 + left L3-4 LESI #1 under fluoroscopic guidance and IV sedation Pre-procedure pain level: 3/10 Post-procedure: 0/10 (100% relief)  Sedation: Sedation provided.  Effectiveness during initial hour after procedure(Ultra-Short Term Relief): 90 %.  Local anesthetic used: Long-acting (4-6 hours) Effectiveness: Defined as any analgesic benefit obtained secondary to the administration of local anesthetics. This carries significant diagnostic value as to the etiological location, or anatomical origin, of the pain. Duration of benefit is expected to coincide with the duration of the local anesthetic used.  Effectiveness during initial 4-6 hours after procedure(Short-Term Relief): 90 %.  Long-term benefit: Defined as any relief past the pharmacologic duration of the local anesthetics.  Effectiveness past the initial 6 hours after procedure(Long-Term Relief): 65 %.  Current benefits: Defined as benefit that persist at this time.    Analgesia:  Ongoing 65% improvement in his low back and lower extremity pain. Function: [name] reports improvement in function ROM: Mr. Spickler reports improvement in ROM  Pharmacotherapy Assessment   Analgesic: Tramadol 50 mg, half a tablet p.o. daily MME/day: 2.5 mg/day   Monitoring: Todd Bradford PMP: PDMP reviewed during this encounter.       Pharmacotherapy: No side-effects or adverse reactions reported. Compliance: No problems identified. Effectiveness: Clinically acceptable.  Dewayne Shorter, RN  03/01/2020 12:49 PM  Signed Safety precautions to be maintained throughout the outpatient stay will include: orient to surroundings, keep bed in low position, maintain call bell within reach at all times, provide assistance with transfer out of bed and ambulation.    UDS:  Summary  Date Value Ref Range Status  02/16/2020 FINAL  Final    Comment:    ==================================================================== [facility] COMP DRUG ANALYSIS,UR ==================================================================== Test                             Result       Flag       Units Drug Present and Declared for Prescription Verification   Tramadol                       5215         EXPECTED   ng/mg creat   O-Desmethyltramadol  6226         EXPECTED   ng/mg creat   N-Desmethyltramadol            1269         EXPECTED   ng/mg creat    Source of tramadol is a prescription medication.    O-desmethyltramadol and N-desmethyltramadol are expected    metabolites of tramadol.    Cyclobenzaprine                PRESENT      EXPECTED   Desmethylcyclobenzaprine       PRESENT      EXPECTED    Desmethylcyclobenzaprine is an expected metabolite of    cyclobenzaprine.  Drug Present not Declared for Prescription Verification   Ephedrine/Pseudoephedrine      PRESENT      UNEXPECTED   Phenylpropanolamine            PRESENT      UNEXPECTED    Source of ephedrine/pseudoephedrine is most commonly     pseudoephedrine in over-the-counter or prescription cold and    allergy medications. Phenylpropanolamine is an expected    metabolite of ephedrine/pseudoephedrine.    Orphenadrine                   PRESENT      UNEXPECTED   Ibuprofen                      PRESENT      UNEXPECTED   Diphenhydramine                PRESENT      UNEXPECTED ==================================================================== Test                      Result    Flag   Units      Ref Range   Creatinine              74               mg/dL      >=20 ==================================================================== Declared Medications:  The flagging and interpretation on this report are based on the  following declared medications.  Unexpected results may arise from  inaccuracies in the declared medications.   **Note: The testing scope of this panel includes these medications:   Cyclobenzaprine (Flexeril)  Tramadol (Ultram)   **Note: The testing scope of this panel does not include following  reported medications:   Atorvastatin (Lipitor)  Finasteride (Proscar)  Tamsulosin (Flomax) ==================================================================== For clinical consultation, please call [phone]. ====================================================================      ROS  Constitutional: Denies any fever or chills Gastrointestinal: No reported hemesis, hematochezia, vomiting, or acute GI distress Musculoskeletal: Denies any acute onset joint swelling, redness, loss of ROM, or weakness Neurological: No reported episodes of acute onset apraxia, aphasia, dysarthria, agnosia, amnesia, paralysis, loss of coordination, or loss of consciousness  Medication Review  atorvastatin, cyclobenzaprine, finasteride, tamsulosin, and traMADol  History Review  Allergy: [name] has No Known Allergies. Drug: [name]  has no history on file for drug use. Alcohol:  has no history on file for alcohol  use. Tobacco:  reports that he quit smoking about 15 years ago. His smoking use included cigarettes. He has a 60.00 pack-year smoking history. He has never used smokeless tobacco. Social: [name]  reports that he quit smoking about 15 years ago. His smoking use included cigarettes. He has a 60.00 pack-year smoking history.  He has never used smokeless tobacco. Medical:  has a past medical history of Hyperlipidemia. Surgical: [name]  has a past surgical history that includes Neck surgery (2000 approx) and Spine surgery. Family: family history includes Heart disease in his mother; Stroke in his father.  Laboratory Chemistry Profile   Renal Lab Results  Component Value Date   BUN 23 02/15/2020   CREATININE 1.31 (H) 02/15/2020   BCR 18 02/15/2020   GFRAA 64 02/15/2020   GFRNONAA 55 (L) 02/15/2020     Hepatic Lab Results  Component Value Date   AST 22 02/15/2020   ALBUMIN 3.9 02/15/2020   ALKPHOS 116 02/15/2020     Electrolytes Lab Results  Component Value Date   NA 138 02/15/2020   K 5.1 02/15/2020   CL 100 02/15/2020   CALCIUM 9.2 02/15/2020   MG 2.3 02/15/2020     Bone Lab Results  Component Value Date   25OHVITD1 37 02/15/2020   25OHVITD2 <1.0 02/15/2020   25OHVITD3 37 02/15/2020     Inflammation (CRP: Acute Phase) (ESR: Chronic Phase) Lab Results  Component Value Date   CRP 9 02/15/2020   ESRSEDRATE 19 02/15/2020       Note: Above Lab results reviewed.  Recent Imaging Review  DG PAIN CLINIC C-ARM 1-60 MIN NO REPORT Fluoro was used, but no Radiologist interpretation will be provided.  Please refer to "NOTES" tab for provider progress note. Note: Reviewed        Physical Exam  General appearance: Well nourished, well developed, and well hydrated. In no apparent acute distress Mental status: Alert, oriented x 3 (person, place, & time)       Respiratory: No evidence of acute respiratory distress Eyes: PERLA Vitals: BP (!) 140/97   Pulse (!) 114    Temp (!) 97.3 F (36.3 C) (Temporal)   Resp 16   Ht '6\' 4"'  (1.93 m)   Wt 220 lb (99.8 kg)   SpO2 100%   BMI 26.78 kg/m  BMI: Estimated body mass index is 26.78 kg/m as calculated from the following:   Height as of this encounter: '6\' 4"'  (1.93 m).   Weight as of this encounter: 220 lb (99.8 kg). Ideal: Ideal body weight: 86.8 kg (191 lb 5.7 oz) Adjusted ideal body weight: 92 kg (202 lb 13 oz)  Assessment   Status Diagnosis  Improved Improved This is a new problem. 1. 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 Chronic lower extremity pain (1ry area of Pain) (Left)   3. Arthropathy of right knee   4. Lumbosacral radiculopathy at L5 (Left)   5. Numbness and tingling of lower extremity (Left)   6. Chronic hip pain (Left)   7. 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 Ligamentum flavum hypertrophy   10. DDD (degenerative disc disease), lumbosacral   11. Lumbosacral facet syndrome (Left)   12. Lumbar facet hypertrophy (Multilevel) (Bilateral)   13. Abnormal MRI, lumbar spine (01/16/2020)   14. Chronic low back pain (Left) w/o sciatica   15. Homans sign present   16. Encounter for assessment for deep vein thrombosis (DVT)   17. Pain and swelling of knee, right      Updated Problems: Problem  Ligamentum Flavum Hypertrophy  Chronic low back pain (Left) w/o sciatica  Homans Sign Present  Arthropathy of Right Knee  Pain and Swelling of Knee, Right  Abnormal MRI, lumbar  spine (01/16/2020)   (01/16/2020) LUMBAR MRI FINDINGS: Alignment: Levoscoliosis centered at L3.  Anterolisthesis of L4 over L5 measuring 3 mm. Degenerative changes: Disc desiccation and volume loss at all levels most advanced at L3-4 and L4-5.  DISC LEVELS:  L1-2: Posterior disc bulge.  Facet hypertrophy.  Ligamentum flavum thickening.  No foraminal or central canal stenosis. L2-3: Posterior disc bulge, facet  hypertrophy and ligamentum flavum thickening result in circumferential effacement of CSF and crowding of the lateral roots of the cauda equina.  Mild bilateral foraminal stenosis.  Moderate canal stenosis. L3-4: Broad posterior disc bulge.  Facet hypertrophy.  Ligamentum flavum thickening.  Near circumferential effacement of CSF with crowding of the nerve roots of the cauda equina.  Moderate bilateral foraminal stenosis.  Mild canal stenosis. L4-5: Broad posterior disc bulge.  Facet hypertrophy.  Ligamentum flavum thickening.  Moderate bilateral foraminal stenosis.  Mild canal stenosis. L5-S1: Broad posterior disc bulge.  Facet hypertrophy.  Ligamentum flavum thickening.  Moderate left and mild right foraminal stenosis.  No canal stenosis.  No foraminal or canal stenosis.  IMPRESSION: Moderate foraminal stenosis at L3-4 and L4-5 bilaterally and on the left at L5-S1 as well as moderate canal stenosis at the L3-4 related to degenerative disc and facet disease.   Encounter for Assessment for Deep Vein Thrombosis (Dvt)    Plan of Care  Problem-specific:  No problem-specific Assessment & Plan notes found for this encounter.  [name] has a current medication list which includes the following long-term medication(s): atorvastatin.  Pharmacotherapy (Medications Ordered): No orders of the defined types were placed in this encounter.  Orders:  Orders Placed This Encounter  Procedures  . LUMBAR FACET(MEDIAL BRANCH NERVE BLOCK) MBNB    Standing Status:   Future    Standing Expiration Date:   04/01/2020    Scheduling Instructions:     Procedure: Lumbar facet block (AKA.: Lumbosacral medial branch nerve block)     Side: Left     Level: L3-4, L4-5, & L5-S1 Facets (L2, L3, L4, L5, & S1 Medial Branch Nerves)     Sedation: Patient's choice.     Timeframe: ASAA    Order Specific Question:   Where will this procedure be performed?    Answer:   [facility]  . US Venous Img Lower  Unilateral Right (DVT)    Scheduling Instructions:     Imaging must be done as soon as possible. Inform patient this test is to be done today.    Order Specific Question:   Reason for Exam (SYMPTOM  OR DIAGNOSIS REQUIRED)    Answer:   Right Lower extremity pain, possibly secondary to DVT.    Order Specific Question:   Preferred imaging location?    Answer:   [facility]    Order Specific Question:   Call Results- Best Contact Number?    Answer:   [phone] ([facility])    Order Specific Question:   Release to patient    Answer:   Immediate  . DG Knee 1-2 Views Right    Standing Status:   Future    Standing Expiration Date:   04/01/2020    Scheduling Instructions:     Imaging must be done as soon as possible. Inform patient that order will expire within 30 days and I will not renew it.    Order Specific Question:   Reason for Exam (SYMPTOM  OR DIAGNOSIS REQUIRED)    Answer:   Right knee pain/arthralgia  Order Specific Question:   Preferred imaging location?    Answer:   [facility]    Order Specific Question:   Call Results- Best Contact Number?    Answer:   [phone] ([facility])    Order Specific Question:   Release to patient    Answer:   Immediate   Follow-up plan:   Return for Procedure (w/ sedation): (L) L-FCT BLK #1.      Interventional Therapies  Risk  Complexity Considerations:   WNL   Planned  Pending:   Diagnostic/therapeutic left L3-4 LESI #1 + left L5 TFESI #1  (today-Tuesday February 16, 2020)   Under consideration:   Diagnostic/therapeutic left L3-4 LESI #1 + left L5 TFESI #1  Diagnostic left lumbar facet block #1    Completed:   None at this time   Therapeutic  Palliative (PRN) options:   None established     Recent Visits Date Type Provider Dept  02/16/20 Procedure visit Milinda Pointer, MD [facility]  02/15/20 Office Visit Milinda Pointer, MD [facility]  Showing recent visits within past  90 days and meeting all other requirements Today's Visits Date Type Provider Dept  03/01/20 Telemedicine Milinda Pointer, MD [facility]  Showing today's visits and meeting all other requirements Future Appointments Date Type Provider Dept  03/03/20 Appointment Milinda Pointer, MD [facility]  Showing future appointments within next 90 days and meeting all other requirements  I discussed the assessment and treatment plan with the patient. The patient was provided an opportunity to ask questions and all were answered. The patient agreed with the plan and demonstrated an understanding of the instructions.  Patient advised to call back or seek an in-person evaluation if the symptoms or condition worsens.  Duration of encounter: 35 minutes.  Note by: Gaspar Cola, MD Date: 03/01/2020; Time: 1:36 PM

## 2020-03-02 ENCOUNTER — Telehealth: Payer: Self-pay | Admitting: Pain Medicine

## 2020-03-02 NOTE — Telephone Encounter (Signed)
Called patient. Per Dr. Dossie Arbour cancel procedure. Patient to follow up with vascular in his area. Put on Xarelto.

## 2020-03-02 NOTE — Telephone Encounter (Signed)
Patient called stating he has a blood clot in his leg and is now on blood thinners. He has an appt scheduled for 03-03-20. Wants to know if he should cancel this?  Please call patient

## 2020-03-03 ENCOUNTER — Ambulatory Visit: Payer: Medicare Other | Admitting: Pain Medicine

## 2020-03-07 ENCOUNTER — Other Ambulatory Visit: Payer: Medicare Other

## 2020-05-25 DIAGNOSIS — M79605 Pain in left leg: Secondary | ICD-10-CM | POA: Insufficient documentation

## 2020-05-25 DIAGNOSIS — M7989 Other specified soft tissue disorders: Secondary | ICD-10-CM | POA: Insufficient documentation

## 2020-05-25 DIAGNOSIS — I82503 Chronic embolism and thrombosis of unspecified deep veins of lower extremity, bilateral: Secondary | ICD-10-CM | POA: Insufficient documentation

## 2020-06-24 DIAGNOSIS — Z7901 Long term (current) use of anticoagulants: Secondary | ICD-10-CM | POA: Insufficient documentation

## 2021-03-09 DIAGNOSIS — Z86718 Personal history of other venous thrombosis and embolism: Secondary | ICD-10-CM | POA: Insufficient documentation

## 2021-03-09 DIAGNOSIS — Z86711 Personal history of pulmonary embolism: Secondary | ICD-10-CM | POA: Insufficient documentation

## 2021-03-09 DIAGNOSIS — I739 Peripheral vascular disease, unspecified: Secondary | ICD-10-CM | POA: Insufficient documentation

## 2021-04-04 NOTE — Progress Notes (Signed)
PROVIDER NOTE: Information contained herein reflects review and annotations entered in association with encounter. Interpretation of such information and data should be left to medically-trained personnel. Information provided to patient can be located elsewhere in the medical record under "Patient Instructions". Document created using STT-dictation technology, any transcriptional errors that may result from process are unintentional.  ?  ?Patient: Todd Bradford  Service Category: E/M  Provider: Gaspar Cola, MD  ?DOB: 02-17-1950  DOS: 04/05/2021  Specialty: Interventional Pain Management  ?MRN: [id number]  Setting: Ambulatory outpatient  PCP: Daun Peacock, NP  ?Type: Established Patient    Referring Provider: Daun Peacock, NP  ?Location: Office  Delivery: Face-to-face    ? ?HPI  ?Todd Bradford, a 71 y.o. year old male, is here today because of his Chronic left-sided low back pain with left-sided sciatica [M54.42, G89.29]. Todd Bradford primary complain today is Back Pain (lower) ?Last encounter: My last encounter with him was on Visit date not found. 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 ?Pain Assessment: Severity of Chronic pain is reported as a 1 /10. Location: Back Right/at times. Onset: More than a month ago. Quality: Constant, Aching, Nagging, Throbbing. Timing: Constant. Modifying factor(s): heating, sitting down, meds. ?Vitals:  height is '6\' 4"'$  (1.93 m) and weight is 222 lb (100.7 kg). His temperature is 97 ?F (36.1 ?C) (abnormal). His blood pressure is 147/82 (abnormal) and his pulse is 93. His oxygen saturation is 100%.  ? ?Reason for encounter: evaluation of worsening, or previously known (established) problem.  The patient returns to the clinic today after last having been seen on 03/01/2020.  Around that time he was diagnosed with a DVT and he could not come off his blood thinner for treatment of his back problems.  He returns now indicating that they have given him the okay to stop the Xarelto for short pieces of time so that we can treat his back problems.  He refers that his current pain is located in the left side of his lower back.  He does not complain of any significant low back pain or lower extremity pain.  However, during today's physical exam, I noticed that he has very little range of motion.  He also indicated having weakness when attempting to raise his right knee to his chest.  Hyperextension and rotation maneuver demonstrated decreased range of motion of his lumbar spine both on hyperextension as well as rotation.  He did have some reproduction of his low back pain by doing that.  Provocative Patrick maneuver was positive on the left side for hip joint pain and on the right side for right sacroiliac joint pain.  Doing this to simple maneuvers then triggered numbness going down both of  his lower extremities but more so on the right side where it went all the way down to his big toe and what appeared to be an  L5 radiculopathy.  X-rays of the lumbar spine done on 02/15/2020 with flexion and extension views revealed bilateral multilevel lumbar facet hypertrophy; levoscoliosis; multilevel DDD; as well as a 5 mm anterolisthesis of L4 over L5 with no apparent evidence of instability with flexion and extension, at that time.  Subsequently he underwent a left-sided L5 transforaminal epidural steroid injection and a left-sided L3-4 LESI which provided him with 90% relief of the pain for the duration of the local anesthetic followed by a 65% improvement that continued until recently. ? ?An MRI of the lumbar spine done on 01/16/2020 demonstrated a 3 mm anterolisthesis of L4 over L5, as well as providing more evidence of bilateral multilevel lumbar facet hypertrophy.  The MRI also demonstrated the patient to be having an L2-3 bilateral foraminal stenosis with moderate central canal stenosis, as well as bilateral foraminal stenosis and mild canal stenosis at the L3-4 level.  At the L4-5 level he had also demonstrated bilateral foraminal stenosis as well as central canal stenosis.  At the L5-S1 level it also again demonstrated bilateral foraminal stenosis. ? ?Combining the clinical findings with his current symptoms we then reached the conclusion that the weakness that he is experiencing on the right lower extremity when attempting to flex the hip is likely to be coming from the right elbow to 3, L3-4, and L4-5 foraminal stenosis.  Symptoms that he is experiencing of an L5 radiculopathy could be originating from a right L5-S1 foraminal stenosis and/or a right lateral recess stenosis at the L4-5 level.  The MRI did not provide any information regarding any stenosis at the level of the subarticular zone. ? ?Due to the above reasons, we have decided to schedule the patient to return for a right-sided L5 transforaminal epidural steroid injection and a left-sided L3-4 LESI.  If this does not improve his low back pain, the neck step would  be to consider doing a left-sided L2-3 LESI vs L2 transforaminal ESI.  If that still does not improve symptoms, then we will need to do a diagnostic lumbar facet block and possible follow-up with radiofrequency ablation.  The plan was shared with the patient who understood and accepted. ? ?Pharmacotherapy Assessment  ?Analgesic: No chronic opioid analgesics therapy prescribed by our practice. Tramadol 50 mg, half a tablet p.o. daily ?MME/day: 2.5 mg/day  ? ?Monitoring: ?[place] PMP: PDMP reviewed during this encounter.       ?Pharmacotherapy: No side-effects or adverse reactions reported. ?Compliance: No problems identified. ?Effectiveness: Clinically acceptable. ? ?Chauncey Fischer, RN  04/05/2021 10:43 AM  Sign when Signing Visit ?Safety precautions to be maintained throughout the outpatient stay will include: orient to surroundings, keep bed in low position, maintain call bell within reach at all times, provide assistance with transfer out of bed and ambulation.  ?   UDS:  ?Summary  ?Date Value Ref Range Status  ?02/16/2020 FINAL  Final  ?  Comment:  ?  ==================================================================== ?[facility]NALYS[facility] ?==================================================================== ?Test                             Result       Flag       Units ?Drug Present and Declared for Prescription Verification ?  Tramadol  5215         EXPECTED   ng/mg creat ?  O-Desmethyltramadol            6226         EXPECTED   ng/mg creat ?  N-Desmethyltramadol            1269         EXPECTED   ng/mg creat ?   Source of tramadol is a prescription medication. ?   O-desmethyltramadol and N-desmethyltramadol are expected ?   metabolites of tramadol. ? ?  Cyclobenzaprine                PRESENT      EXPECTED ?  Desmethylcyclobenzaprine       PRESENT      EXPECTED ?   Desmethylcyclobenzaprine is an expected metabolite of ?   cyclobenzaprine. ? ?Drug Present not Declared for Prescription  Verification ?  Ephedrine/Pseudoephedrine      PRESENT      UNEXPECTED ?  Phenylpropanolamine            PRESENT      UNEXPECTED ?   Source of ephedrine/pseudoephedrine is most commonly ?   pseudoephe

## 2021-04-05 ENCOUNTER — Encounter: Payer: Self-pay | Admitting: Pain Medicine

## 2021-04-05 ENCOUNTER — Ambulatory Visit: Payer: Medicare Other | Attending: Pain Medicine | Admitting: Pain Medicine

## 2021-04-05 ENCOUNTER — Telehealth: Payer: Self-pay

## 2021-04-05 VITALS — BP 147/82 | HR 93 | Temp 97.0°F | Ht 76.0 in | Wt 222.0 lb

## 2021-04-05 DIAGNOSIS — Z7901 Long term (current) use of anticoagulants: Secondary | ICD-10-CM | POA: Diagnosis present

## 2021-04-05 DIAGNOSIS — M48061 Spinal stenosis, lumbar region without neurogenic claudication: Secondary | ICD-10-CM

## 2021-04-05 DIAGNOSIS — M51379 Other intervertebral disc degeneration, lumbosacral region without mention of lumbar back pain or lower extremity pain: Secondary | ICD-10-CM

## 2021-04-05 DIAGNOSIS — M5417 Radiculopathy, lumbosacral region: Secondary | ICD-10-CM | POA: Diagnosis present

## 2021-04-05 DIAGNOSIS — M79605 Pain in left leg: Secondary | ICD-10-CM | POA: Insufficient documentation

## 2021-04-05 DIAGNOSIS — M48062 Spinal stenosis, lumbar region with neurogenic claudication: Secondary | ICD-10-CM | POA: Diagnosis present

## 2021-04-05 DIAGNOSIS — M5137 Other intervertebral disc degeneration, lumbosacral region: Secondary | ICD-10-CM | POA: Diagnosis present

## 2021-04-05 DIAGNOSIS — R937 Abnormal findings on diagnostic imaging of other parts of musculoskeletal system: Secondary | ICD-10-CM

## 2021-04-05 DIAGNOSIS — M5442 Lumbago with sciatica, left side: Secondary | ICD-10-CM | POA: Diagnosis present

## 2021-04-05 DIAGNOSIS — G8929 Other chronic pain: Secondary | ICD-10-CM | POA: Diagnosis present

## 2021-04-05 NOTE — Progress Notes (Signed)
Safety precautions to be maintained throughout the outpatient stay will include: orient to surroundings, keep bed in low position, maintain call bell within reach at all times, provide assistance with transfer out of bed and ambulation.  

## 2021-04-05 NOTE — Patient Instructions (Signed)
______________________________________________________________________ ? ?Preparing for Procedure with Sedation ? ?NOTICE: Due to recent regulatory changes, starting on August 01, 2020, procedures requiring intravenous (IV) sedation will no longer be performed at the [facility].  These types of procedures are required to be performed at [facility] ambulatory surgery facility.  We are very sorry for the inconvenience. ? ?Procedure appointments are limited to planned procedures: ?No Prescription Refills. ?No disability issues will be discussed. ?No medication changes will be discussed. ? ?Instructions: ?Oral Intake: Do not eat or drink anything for at least 8 hours prior to your procedure. (Exception: Blood Pressure Medication. See below.) ?Transportation: A driver is required. You may not drive yourself after the procedure. ?Blood Pressure Medicine: Do not forget to take your blood pressure medicine with a sip of water the morning of the procedure. If your Diastolic (lower reading) is above 100 mmHg, elective cases will be cancelled/rescheduled. ?Blood thinners: These will need to be stopped for procedures. Notify our staff if you are taking any blood thinners. Depending on which one you take, there will be specific instructions on how and when to stop it. ?Diabetics on insulin: Notify the staff so that you can be scheduled 1st case in the morning. If your diabetes requires high dose insulin, take only ? of your normal insulin dose the morning of the procedure and notify the staff that you have done so. ?Preventing infections: Shower with an antibacterial soap the morning of your procedure. ?Build-up your immune system: Take 1000 mg of Vitamin C with every meal (3 times a day) the day prior to your procedure. ?Antibiotics: Inform the staff if you have a condition or reason that requires you to take antibiotics before dental procedures. ?Pregnancy: If you are pregnant, call and cancel the procedure. ?Sickness: If  you have a cold, fever, or any active infections, call and cancel the procedure. ?Arrival: You must be in the facility at least 30 minutes prior to your scheduled procedure. ?Children: Do not bring children with you. ?Dress appropriately: Bring dark clothing that you would not mind if they get stained. ?Valuables: Do not bring any jewelry or valuables. ? ?Reasons to call and reschedule or cancel your procedure: (Following these recommendations will minimize the risk of a serious complication.) ?Surgeries: Avoid having procedures within 2 weeks of any surgery. (Avoid for 2 weeks before or after any surgery). ?Flu Shots: Avoid having procedures within 2 weeks of a flu shots. (Avoid for 2 weeks before or after immunizations). ?Barium: Avoid having a procedure within 7-10 days after having had a radiological study involving the use of radiological contrast. (Myelograms, Barium swallow or enema study). ?Heart attacks: Avoid any elective procedures or surgeries for the initial 6 months after a "Myocardial Infarction" (Heart Attack). ?Blood thinners: It is imperative that you stop these medications before procedures. Let us know if you if you take any blood thinner.  ?Infection: Avoid procedures during or within two weeks of an infection (including chest colds or gastrointestinal problems). Symptoms associated with infections include: Localized redness, fever, chills, night sweats or profuse sweating, burning sensation when voiding, cough, congestion, stuffiness, runny nose, sore throat, diarrhea, nausea, vomiting, cold or Flu symptoms, recent or current infections. It is specially important if the infection is over the area that we intend to treat. ?Heart and lung problems: Symptoms that may suggest an active cardiopulmonary problem include: cough, chest pain, breathing difficulties or shortness of breath, dizziness, ankle swelling, uncontrolled high or unusually low blood pressure, and/or palpitations. If you are    experiencing any of these symptoms, cancel your procedure and contact your primary care physician for an evaluation. ? ?Remember:  ?Regular Business hours are:  ?Monday to Thursday 8:00 AM to 4:00 PM ? ?Provider's Schedule: ?Tamari Busic, MD:  ?Procedure days: Tuesday and Thursday 7:30 AM to 4:00 PM ? ?Bilal Lateef, MD:  ?Procedure days: Monday and Wednesday 7:30 AM to 4:00 PM ?______________________________________________________________________ ? ____________________________________________________________________________________________ ? ?General Risks and Possible Complications ? ?Patient Responsibilities: It is important that you read this as it is part of your informed consent. It is our duty to inform you of the risks and possible complications associated with treatments offered to you. It is your responsibility as a patient to read this and to ask questions about anything that is not clear or that you believe was not covered in this document. ? ?Patient?s Rights: You have the right to refuse treatment. You also have the right to change your mind, even after initially having agreed to have the treatment done. However, under this last option, if you wait until the last second to change your mind, you may be charged for the materials used up to that point. ? ?Introduction: Medicine is not an exact science. Everything in Medicine, including the lack of treatment(s), carries the potential for danger, harm, or loss (which is by definition: Risk). In Medicine, a complication is a secondary problem, condition, or disease that can aggravate an already existing one. All treatments carry the risk of possible complications. The fact that a side effects or complications occurs, does not imply that the treatment was conducted incorrectly. It must be clearly understood that these can happen even when everything is done following the highest safety standards. ? ?No treatment: You can choose not to proceed with the  proposed treatment alternative. The ?PRO(s)? would include: avoiding the risk of complications associated with the therapy. The ?CON(s)? would include: not getting any of the treatment benefits. These benefits fall under one of three categories: diagnostic; therapeutic; and/or palliative. Diagnostic benefits include: getting information which can ultimately lead to improvement of the disease or symptom(s). Therapeutic benefits are those associated with the successful treatment of the disease. Finally, palliative benefits are those related to the decrease of the primary symptoms, without necessarily curing the condition (example: decreasing the pain from a flare-up of a chronic condition, such as incurable terminal cancer). ? ?General Risks and Complications: These are associated to most interventional treatments. They can occur alone, or in combination. They fall under one of the following six (6) categories: no benefit or worsening of symptoms; bleeding; infection; nerve damage; allergic reactions; and/or death. ?No benefits or worsening of symptoms: In Medicine there are no guarantees, only probabilities. No healthcare provider can ever guarantee that a medical treatment will work, they can only state the probability that it may. Furthermore, there is always the possibility that the condition may worsen, either directly, or indirectly, as a consequence of the treatment. ?Bleeding: This is more common if the patient is taking a blood thinner, either prescription or over the counter (example: Goody Powders, Fish oil, Aspirin, Garlic, etc.), or if suffering a condition associated with impaired coagulation (example: Hemophilia, cirrhosis of the liver, low platelet counts, etc.). However, even if you do not have one on these, it can still happen. If you have any of these conditions, or take one of these drugs, make sure to notify your treating physician. ?Infection: This is more common in patients with a compromised  immune system, either due to disease (example:   diabetes, cancer, human immunodeficiency virus [HIV], etc.), or due to medications or treatments (example: therapies used to treat cancer and rheumatological diseas

## 2021-04-05 NOTE — Telephone Encounter (Signed)
This patient needs to have permission to stop his blood thinner before his procedure.  I am faxing the permission slip now so I will let you know when I get clearance ?

## 2021-04-12 ENCOUNTER — Telehealth: Payer: Self-pay

## 2021-04-12 NOTE — Telephone Encounter (Signed)
Called patient to notify him that we received clearance to stop Xarelto for 3 days LM with instructions and told patinet to call back for any further questions or concerns.   ?

## 2021-04-23 NOTE — Progress Notes (Signed)
PROVIDER NOTE: Interpretation of information contained herein should be left to medically-trained personnel. Specific patient instructions are provided elsewhere under "Patient Instructions" section of medical record. This document was created in part using STT-dictation technology, any transcriptional errors that may result from this process are unintentional.  ?Patient: Todd Bradford ?Type: Established ?DOB: 19-Oct-1950 ?MRN: [id number] ?PCP: Daun Peacock, NP  Service: Procedure ?DOS: 04/25/2021 ?Setting: Ambulatory ?Location: Ambulatory outpatient facility ?Delivery: Face-to-face Provider: Gaspar Cola, MD ?Specialty: Interventional Pain Management ?Specialty designation: 09 ?Location: Outpatient facility ?Ref. Prov.: Daun Peacock, NP   ? ?Primary Reason for Visit: Interventional Pain Management Treatment. ?CC: Back Pain (lower) ? ?  ?Procedure #1:  Anesthesia, Analgesia, Anxiolysis:  ?Type: Therapeutic Trans-Foraminal Epidural Steroid Injection   #2  ?Region: Lumbar ?Level: L5 Paravertebral ?Laterality: Left Paravertebral ?  Anesthesia: Local (1-2% Lidocaine)  ?Anxiolysis: None  ?Sedation: None  ?Guidance: Fluoroscopy         ? ? ?Position: Prone  ?Procedure #2:    ?Type: Therapeutic Inter-Laminar Epidural Steroid Injection   #1  ?Region: Lumbar ?Level: L3-4 Level. ?Laterality: Right            ? ?1. Chronic low back pain (1ry area of Pain) (Left) w/ sciatica (Left)   ?2. Chronic lower extremity pain (2ry area of Pain) (Left)   ?3. Anterolisthesis of lumbar spine L4/L5 (48m)   ?4. DDD (degenerative disc disease), lumbar   ?5. DDD (degenerative disc disease), lumbosacral   ?6. Ligamentum flavum hypertrophy   ?7. 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 Lumbosacral radiculopathy at L5 (Left)   ?10. Chronic anticoagulation (Xarelto)   ?11. Chronic left-sided low back pain with left-sided sciatica   ?12.  Chronic pain of left lower extremity   ?13. Anterolisthesis of lumbar spine   ?14. Spinal stenosis, lumbar region, with neurogenic claudication   ?15. Lumbar foraminal stenosis   ?16. Lumbosacral radiculopathy at L5   ? ?NAS-11 Pain score:  ? Pre-procedure: 3 /10  ? Post-procedure: 0-No pain/10  ? ?  ?Pre-op H&P Assessment:  ?Mr. DQuintanaris a 71y.o. (year old), male patient, seen today for interventional treatment. He  has a past surgical history that includes Neck surgery (2000 approx) and Spine surgery. Mr. DNestlehas a current medication list which includes the following prescription(s): prevagen extra strength, vitamin c, atorvastatin, cholecalciferol, finasteride, rivaroxaban stater pack (15 mg and 20 mg), tamsulosin, and tramadol, and the following Facility-Administered Medications: pentafluoroprop-tetrafluoroeth. His primarily concern today is the Back Pain (lower) ? ?Initial Vital Signs:  ?Pulse/HCG Rate: 90ECG Heart Rate: 94 ?Temp: (!) 97.1 ?F (36.2 ?C) ?Resp: 16 ?BP: 134/84 ?SpO2: 95 % ? ?BMI: Estimated body mass index is 26.41 kg/m? as calculated from the following: ?  Height as of this encounter: '6\' 4"'$  (1.93 m). ?  Weight as of this encounter: 217 lb (98.4 kg). ? ?Risk Assessment: ?Allergies: Reviewed. He has No Known Allergies.  ?Allergy Precautions: None required ?Coagulopathies: Reviewed. None identified.  ?Blood-thinner therapy: None at this time ?Active Infection(s): Reviewed. None identified. Mr. DFreelandis afebrile ? ?Site Confirmation: Mr. DRayfordwas asked to confirm the procedure and laterality before marking the site ?Procedure checklist: Completed ?Consent: Before the procedure and under the influence of no sedative(s), amnesic(s), or anxiolytics, the patient was informed of the treatment options, risks and possible complications. To fulfill our ethical and legal obligations, as recommended by the American Medical Association's Code  of Ethics, I have informed the patient of my clinical  impression; the nature and purpose of the treatment or procedure; the risks, benefits, and possible complications of the intervention; the alternatives, including doing nothing; the risk(s) and benefit(s) of the alternative treatment(s) or procedure(s); and the risk(s) and benefit(s) of doing nothing. ?The patient was provided information about the general risks and possible complications associated with the procedure. These may include, but are not limited to: failure to achieve desired goals, infection, bleeding, organ or nerve damage, allergic reactions, paralysis, and death. ?In addition, the patient was informed of those risks and complications associated to Spine-related procedures, such as failure to decrease pain; infection (i.e.: Meningitis, epidural or intraspinal abscess); bleeding (i.e.: epidural hematoma, subarachnoid hemorrhage, or any other type of intraspinal or peri-dural bleeding); organ or nerve damage (i.e.: Any type of peripheral nerve, nerve root, or spinal cord injury) with subsequent damage to sensory, motor, and/or autonomic systems, resulting in permanent pain, numbness, and/or weakness of one or several areas of the body; allergic reactions; (i.e.: anaphylactic reaction); and/or death. ?Furthermore, the patient was informed of those risks and complications associated with the medications. These include, but are not limited to: allergic reactions (i.e.: anaphylactic or anaphylactoid reaction(s)); adrenal axis suppression; blood sugar elevation that in diabetics may result in ketoacidosis or comma; water retention that in patients with history of congestive heart failure may result in shortness of breath, pulmonary edema, and decompensation with resultant heart failure; weight gain; swelling or edema; medication-induced neural toxicity; particulate matter embolism and blood vessel occlusion with resultant organ, and/or nervous system infarction; and/or aseptic necrosis of one or more  joints. ?Finally, the patient was informed that Medicine is not an exact science; therefore, there is also the possibility of unforeseen or unpredictable risks and/or possible complications that may result in a catastrophic outcome. The patient indicated having understood very clearly. We have given the patient no guarantees and we have made no promises. Enough time was given to the patient to ask questions, all of which were answered to the patient's satisfaction. Mr. Spitler has indicated that he wanted to continue with the procedure. ?Attestation: I, the ordering provider, attest that I have discussed with the patient the benefits, risks, side-effects, alternatives, likelihood of achieving goals, and potential problems during recovery for the procedure that I have provided informed consent. ?Date  Time: 04/25/2021 10:29 AM ? ?Pre-Procedure Preparation:  ?Monitoring: As per clinic protocol. Respiration, ETCO2, SpO2, BP, heart rate and rhythm monitor placed and checked for adequate function ?Safety Precautions: Patient was assessed for positional comfort and pressure points before starting the procedure. ?Time-out: I initiated and conducted the "Time-out" before starting the procedure, as per protocol. The patient was asked to participate by confirming the accuracy of the "Time Out" information. Verification of the correct person, site, and procedure were performed and confirmed by me, the nursing staff, and the patient. "Time-out" conducted as per Joint Commission's Universal Protocol (UP.01.01.01). ?Time: 9735 ? ?Description of Procedure #1:  ?Target Area: The inferior and lateral portion of the pedicle, just lateral to a line created by the 6:00 position of the pedicle and the superior articular process of the vertebral body below. On the lateral view, this target lies just posterior to the anterior aspect of the lamina and posterior to the midpoint created between the anterior and the posterior aspect of the  neural foramina. ?Approach: Posterior paravertebral approach. ?Area Prepped: Entire Posterior Lumbosacral Region ?DuraPrep (Iodine Povacrylex [0.7% available iodine] and Isopropyl Alcohol, 74%  w/w) ?Safety Precautions: Aspir

## 2021-04-25 ENCOUNTER — Ambulatory Visit (HOSPITAL_BASED_OUTPATIENT_CLINIC_OR_DEPARTMENT_OTHER): Payer: Medicare Other | Admitting: Pain Medicine

## 2021-04-25 ENCOUNTER — Ambulatory Visit
Admission: RE | Admit: 2021-04-25 | Discharge: 2021-04-25 | Disposition: A | Discharge status: Home / Self Care | Payer: Medicare Other | Source: Ambulatory Visit | Attending: Pain Medicine | Admitting: Pain Medicine

## 2021-04-25 ENCOUNTER — Encounter: Payer: Self-pay | Admitting: Pain Medicine

## 2021-04-25 VITALS — BP 119/78 | HR 90 | Temp 97.2°F | Resp 16 | Ht 76.0 in | Wt 217.0 lb

## 2021-04-25 DIAGNOSIS — M48061 Spinal stenosis, lumbar region without neurogenic claudication: Secondary | ICD-10-CM

## 2021-04-25 DIAGNOSIS — M48062 Spinal stenosis, lumbar region with neurogenic claudication: Secondary | ICD-10-CM | POA: Insufficient documentation

## 2021-04-25 DIAGNOSIS — G8929 Other chronic pain: Secondary | ICD-10-CM

## 2021-04-25 DIAGNOSIS — M5136 Other intervertebral disc degeneration, lumbar region: Secondary | ICD-10-CM

## 2021-04-25 DIAGNOSIS — M4316 Spondylolisthesis, lumbar region: Secondary | ICD-10-CM | POA: Insufficient documentation

## 2021-04-25 DIAGNOSIS — M2428 Disorder of ligament, vertebrae: Secondary | ICD-10-CM | POA: Insufficient documentation

## 2021-04-25 DIAGNOSIS — M5442 Lumbago with sciatica, left side: Secondary | ICD-10-CM

## 2021-04-25 DIAGNOSIS — Z7901 Long term (current) use of anticoagulants: Secondary | ICD-10-CM | POA: Diagnosis present

## 2021-04-25 DIAGNOSIS — M5417 Radiculopathy, lumbosacral region: Secondary | ICD-10-CM | POA: Diagnosis present

## 2021-04-25 DIAGNOSIS — M5137 Other intervertebral disc degeneration, lumbosacral region: Secondary | ICD-10-CM

## 2021-04-25 DIAGNOSIS — M79605 Pain in left leg: Secondary | ICD-10-CM | POA: Diagnosis present

## 2021-04-25 MED ORDER — DEXAMETHASONE SODIUM PHOSPHATE 10 MG/ML IJ SOLN
INTRAMUSCULAR | Status: AC
  Filled 2021-04-25: qty 1

## 2021-04-25 MED ORDER — ROPIVACAINE HCL 2 MG/ML IJ SOLN
2.0000 mL | Freq: Once | INTRAMUSCULAR | Status: AC
  Administered 2021-04-25: 2 mL via EPIDURAL

## 2021-04-25 MED ORDER — IOHEXOL 180 MG/ML  SOLN
10.0000 mL | Freq: Once | INTRAMUSCULAR | Status: AC
  Administered 2021-04-25: 5 mL via INTRATHECAL

## 2021-04-25 MED ORDER — TRIAMCINOLONE ACETONIDE 40 MG/ML IJ SUSP
INTRAMUSCULAR | Status: AC
  Filled 2021-04-25: qty 1

## 2021-04-25 MED ORDER — SODIUM CHLORIDE 0.9% FLUSH
2.0000 mL | Freq: Once | INTRAVENOUS | Status: AC
  Administered 2021-04-25: 2 mL

## 2021-04-25 MED ORDER — SODIUM CHLORIDE (PF) 0.9 % IJ SOLN
INTRAMUSCULAR | Status: AC
  Filled 2021-04-25: qty 10

## 2021-04-25 MED ORDER — LIDOCAINE HCL 2 % IJ SOLN
20.0000 mL | Freq: Once | INTRAMUSCULAR | Status: AC
  Administered 2021-04-25: 400 mg

## 2021-04-25 MED ORDER — SODIUM CHLORIDE 0.9% FLUSH
1.0000 mL | Freq: Once | INTRAVENOUS | Status: AC
  Administered 2021-04-25: 1 mL

## 2021-04-25 MED ORDER — LACTATED RINGERS IV SOLN
1000.0000 mL | Freq: Once | INTRAVENOUS | Status: AC
  Administered 2021-04-25: 1000 mL via INTRAVENOUS

## 2021-04-25 MED ORDER — LIDOCAINE HCL 2 % IJ SOLN
INTRAMUSCULAR | Status: AC
  Filled 2021-04-25: qty 20

## 2021-04-25 MED ORDER — PENTAFLUOROPROP-TETRAFLUOROETH EX AERO: INHALATION_SPRAY | Freq: Once | CUTANEOUS | Status: DC

## 2021-04-25 MED ORDER — MIDAZOLAM HCL 5 MG/5ML IJ SOLN
0.5000 mg | Freq: Once | INTRAMUSCULAR | Status: AC
  Administered 2021-04-25: 2 mg via INTRAVENOUS

## 2021-04-25 MED ORDER — MIDAZOLAM HCL 5 MG/5ML IJ SOLN
INTRAMUSCULAR | Status: AC
Start: 2021-04-25 — End: ?
  Filled 2021-04-25: qty 5

## 2021-04-25 MED ORDER — ROPIVACAINE HCL 2 MG/ML IJ SOLN
INTRAMUSCULAR | Status: AC
  Filled 2021-04-25: qty 20

## 2021-04-25 MED ORDER — ROPIVACAINE HCL 2 MG/ML IJ SOLN
1.0000 mL | Freq: Once | INTRAMUSCULAR | Status: AC
  Administered 2021-04-25: 1 mL via EPIDURAL

## 2021-04-25 MED ORDER — TRIAMCINOLONE ACETONIDE 40 MG/ML IJ SUSP
40.0000 mg | Freq: Once | INTRAMUSCULAR | Status: AC
  Administered 2021-04-25: 40 mg

## 2021-04-25 MED ORDER — DEXAMETHASONE SODIUM PHOSPHATE 10 MG/ML IJ SOLN
10.0000 mg | Freq: Once | INTRAMUSCULAR | Status: AC
  Administered 2021-04-25: 10 mg

## 2021-04-25 NOTE — Patient Instructions (Signed)

## 2021-04-25 NOTE — Progress Notes (Signed)
Safety precautions to be maintained throughout the outpatient stay will include: orient to surroundings, keep bed in low position, maintain call bell within reach at all times, provide assistance with transfer out of bed and ambulation.  

## 2021-04-26 ENCOUNTER — Telehealth: Payer: Self-pay | Admitting: *Deleted

## 2021-04-26 NOTE — Telephone Encounter (Signed)
No problems post procedure. 

## 2021-05-07 NOTE — Progress Notes (Signed)
Patient: Todd Bradford  Service Category: E/M  Provider: Gaspar Cola, MD  ?DOB: [birth year]  DOS: 05/09/2021  Location: Office  ?MRN: [id number]  Setting: Ambulatory outpatient  Referring Provider: Daun Peacock, NP  ?Type: Established Patient  Specialty: Interventional Pain Management  PCP: Daun Peacock, NP  ?Location: Remote location  Delivery: TeleHealth    ? ?Virtual Encounter - Pain Management ?PROVIDER NOTE: Information contained herein reflects review and annotations entered in association with encounter. Interpretation of such information and data should be left to medically-trained personnel. Information provided to patient can be located elsewhere in the medical record under "Patient Instructions". Document created using STT-dictation technology, any transcriptional errors that may result from process are unintentional.  ?  ?Contact & Pharmacy ?Preferred: [phone] ?Home: [phone] (home) ?Mobile: [phone] (mobile) ?E-mail: [email or web address]  ?[place], [address] ?[address] ?[place] [address] ?Phone: [phone] Fax: [phone] ? ?[place] ?[place] ?[place] [address] ?Phone: [phone] Fax: [phone] ?  ?Pre-screening  ?Mr. Coppock offered "in-person" vs "virtual" encounter. He indicated preferring virtual for this encounter.  ? ?Reason ?COVID-19*  Social distancing based on CDC and AMA recommendations.  ? ?I contacted Ranald Alessio on 05/09/2021 via telephone.      I clearly identified myself as Gaspar Cola, MD. I verified that I was speaking with the correct person using two identifiers (Name: Jakyle Petrucelli, and date of birth: December 31, 1950). ? ?Consent ?I sought verbal advanced consent from [name] for virtual visit interactions. I informed Mr. Upchurch of possible security and privacy concerns, risks, and limitations associated with providing  "not-in-person" medical evaluation and management services. I also informed Mr. Fussell of the availability of "in-person" appointments. Finally, I informed him that there would be a charge for the virtual visit and that he could be  personally, fully or partially, financially responsible for it. Mr. Hellen expressed understanding and agreed to proceed.  ? ?Historic Elements   ?Mr. Adron Geisel is a 71 y.o. year old, male patient evaluated today after our last contact on 04/25/2021. Mr. Oestreich  has a past medical history of Hyperlipidemia. He also  has a past surgical history that includes Neck surgery (2000 approx) and Spine surgery. Mr. Perleberg has a current medication list which includes the following prescription(s): prevagen extra strength, vitamin c, aspirin ec, atorvastatin, cholecalciferol, finasteride, tamsulosin, and tramadol. He  reports that he quit smoking about 16 years ago. His smoking use included cigarettes. He has a 60.00 pack-year smoking history. He has never used smokeless tobacco. No history on file for alcohol use and drug use. Mr. Ashworth has No Known Allergies.  ? ?HPI  ?Today, he is being contacted for a post-procedure assessment.  The patient refers having attained 100% relief of the pain for the duration of the local anesthetic and the same 1% persisted for another week or so while he was on vacation in the beach.  Unfortunately he seems to have contracted something that he seems to think may be COVID-19 and since then he has had a lot of generalized aches and pains.  Today I have suggested that he go ahead and test himself for the COVID-19 and then give it some time until he fully recovers from what ever is it that he has contracted.  Once he has done that, he can then reevaluate how he is doing at  that time with regards to his lumbar radiculitis.  I have informed the patient that should he begin to have the low back pain and lower extremity pain again, he just needs to give [place] a call and we  will go ahead and repeat the treatment.  He was very grateful for the offer and he indicates that once he gets over this, if the pain has returned, he will certainly give [place] a call. ? ?Post-procedure evaluation  ?  ?Procedure #1:  Anesthesia, Analgesia, Anxiolysis:  ?Type: Therapeutic Trans-Foraminal Epidural Steroid Injection   #2  ?Region: Lumbar ?Level: L5 Paravertebral ?Laterality: Left Paravertebral ?  Anesthesia: Local (1-2% Lidocaine)  ?Anxiolysis: None  ?Sedation: None  ?Guidance: Fluoroscopy         ? ? ?Position: Prone  ?Procedure #2:    ?Type: Therapeutic Inter-Laminar Epidural Steroid Injection   #1  ?Region: Lumbar ?Level: L3-4 Level. ?Laterality: Right            ? ?1. Chronic low back pain (1ry area of Pain) (Left) w/ sciatica (Left)   ?2. Chronic lower extremity pain (2ry area of Pain) (Left)   ?3. Anterolisthesis of lumbar spine L4/L5 (51m)   ?4. DDD (degenerative disc disease), lumbar   ?5. DDD (degenerative disc disease), lumbosacral   ?6. Ligamentum flavum hypertrophy   ?7. 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 Lumbosacral radiculopathy at L5 (Left)   ?10. Chronic anticoagulation (Xarelto)   ?11. Chronic left-sided low back pain with left-sided sciatica   ?12. Chronic pain of left lower extremity   ?13. Anterolisthesis of lumbar spine   ?14. Spinal stenosis, lumbar region, with neurogenic claudication   ?15. Lumbar foraminal stenosis   ?16. Lumbosacral radiculopathy at L5   ? ?NAS-11 Pain score:  ? Pre-procedure: 3 /10  ? Post-procedure: 0-No pain/10  ? ?   ?Effectiveness:  ?Initial hour after procedure: 100 %. ?Subsequent 4-6 hours post-procedure: 100 %. ?Analgesia past initial 6 hours: 100 % (the only pain that he is noticing is first thing in the morning otherwise good.). ?Ongoing improvement:  ?Analgesic: The patient refers having obtained significant benefit from the interventional  treatment.  Unfortunately, he refers that this past weekend he went to the beach and apparently he might have contracted COVID-19.  He refers that he has been having generalized pains and the discomfort is associated with what ever he contracted.  He refers that he has not tested himself for COVID yet, but he is planning on doing so.  Other than that, he refers that last week he was feeling fantastic after having had the interlaminar and transforaminal epidural steroid injections. ?Function: Mr. DBeeverreports improvement in function ?ROM: Mr. DErnyreports improvement in ROM ? ?Pharmacotherapy Assessment  ? ?Opioid Analgesic: No chronic opioid analgesics therapy prescribed by our practice. Tramadol 50 mg, half a tablet p.o. daily ?MME/day: 2.5 mg/day  ? ?Monitoring: ?[place] PMP: PDMP reviewed during this encounter.       ?Pharmacotherapy: No side-effects or adverse reactions reported. ?Compliance: No problems identified. ?Effectiveness: Clinically acceptable. ?Plan: Refer to "POC". UDS:  ?Summary  ?Date Value Ref Range Status  ?02/16/2020 FINAL  Final  ?  Comment:  ?  ==================================================================== ?[facility] COMP DRUG ANALYSIS,[facility] ?==================================================================== ?Test                             Result  Flag       Units ?Drug Present and Declared for Prescription Verification ?  Tramadol                       5215         EXPECTED   ng/mg creat ?  O-Desmethyltramadol            6226         EXPECTED   ng/mg creat ?  N-Desmethyltramadol            1269         EXPECTED   ng/mg creat ?   Source of tramadol is a prescription medication. ?   O-desmethyltramadol and N-desmethyltramadol are expected ?   metabolites of tramadol. ? ?  Cyclobenzaprine                PRESENT      EXPECTED ?  Desmethylcyclobenzaprine       PRESENT      EXPECTED ?   Desmethylcyclobenzaprine is an expected metabolite of ?   cyclobenzaprine. ? ?Drug Present not  Declared for Prescription Verification ?  Ephedrine/Pseudoephedrine      PRESENT      UNEXPECTED ?  Phenylpropanolamine            PRESENT      UNEXPECTED ?   Source of ephedrine/pseudoephedrine is most commonly ?   pseudoeph

## 2021-05-09 ENCOUNTER — Ambulatory Visit: Payer: Medicare Other | Attending: Pain Medicine | Admitting: Pain Medicine

## 2021-05-09 DIAGNOSIS — M48062 Spinal stenosis, lumbar region with neurogenic claudication: Secondary | ICD-10-CM

## 2021-05-09 DIAGNOSIS — M79605 Pain in left leg: Secondary | ICD-10-CM

## 2021-05-09 DIAGNOSIS — M48061 Spinal stenosis, lumbar region without neurogenic claudication: Secondary | ICD-10-CM

## 2021-05-09 DIAGNOSIS — G8929 Other chronic pain: Secondary | ICD-10-CM

## 2021-05-09 DIAGNOSIS — M4316 Spondylolisthesis, lumbar region: Secondary | ICD-10-CM | POA: Diagnosis not present

## 2021-05-09 DIAGNOSIS — M5442 Lumbago with sciatica, left side: Secondary | ICD-10-CM

## 2021-05-09 DIAGNOSIS — M5417 Radiculopathy, lumbosacral region: Secondary | ICD-10-CM

## 2021-08-16 DIAGNOSIS — D689 Coagulation defect, unspecified: Secondary | ICD-10-CM | POA: Insufficient documentation

## 2021-08-16 DIAGNOSIS — Z87438 Personal history of other diseases of male genital organs: Secondary | ICD-10-CM | POA: Insufficient documentation

## 2021-08-27 NOTE — Progress Notes (Unsigned)
Patient: Todd Bradford  Service Category: E/M  Provider: Gaspar Cola, MD  DOB: 01/08/1950  DOS: 08/28/2021  Location: Office  MRN: [id number]  Setting: Ambulatory outpatient  Referring Provider: Daun Peacock, NP  Type: Established Patient  Specialty: Interventional Pain Management  PCP: Daun Peacock, NP  Location: Remote location  Delivery: TeleHealth     Virtual Encounter - Pain Management PROVIDER NOTE: Information contained herein reflects review and annotations entered in association with encounter. Interpretation of such information and data should be left to medically-trained personnel. Information provided to patient can be located elsewhere in the medical record under "Patient Instructions". Document created using STT-dictation technology, any transcriptional errors that may result from process are unintentional.    Contact & Pharmacy Preferred: [phone] Home: [phone] (home) Mobile: [phone] (mobile) E-mail: [email or web address]  [place], [place] [address] Phone: [phone] Fax: [place] [facility] [place], [place] [address] Phone: [phone] Fax: [phone]   Pre-screening  Todd Bradford offered "in-person" vs "virtual" encounter. He indicated preferring virtual for this encounter.   Reason COVID-19*  Social distancing based on CDC and AMA recommendations.   I contacted Todd Bradford on 08/28/2021 via telephone.      I clearly identified myself as Gaspar Cola, MD. I verified that I was speaking with the correct person using two identifiers (Name: Todd Bradford, and date of birth: 20-Jan-1950).  Consent I sought verbal advanced consent from Todd Bradford for virtual visit interactions. I informed Todd Bradford of possible security and privacy concerns, risks, and limitations associated with providing  "not-in-person" medical evaluation and management services. I also informed Todd Bradford of the availability of "in-person" appointments. Finally, I informed him that there would be a charge for the virtual visit and that he could be  personally, fully or partially, financially responsible for it. Todd Bradford expressed understanding and agreed to proceed.   Historic Elements   Todd Bradford is a 71 y.o. year old, male patient evaluated today after our last contact on 05/09/2021. Todd Bradford  has a past medical history of Hyperlipidemia. He also  has a past surgical history that includes Neck surgery (2000 approx) and Spine surgery. Todd Bradford has a current medication list which includes the following prescription(s): prevagen extra strength, vitamin c, atorvastatin, cholecalciferol, finasteride, rivaroxaban, tamsulosin, tramadol, and aspirin ec. He  reports that he quit smoking about 16 years ago. His smoking use included cigarettes. He has a 60.00 pack-year smoking history. He has never used smokeless tobacco. No history on file for alcohol use and drug use. Todd Bradford has No Known Allergies.   HPI  Today, he is being contacted for  ***   Pharmacotherapy Assessment   Opioid Analgesic: No chronic opioid analgesics therapy prescribed by our practice. Tramadol 50 mg, half a tablet p.o. daily MME/day: 2.5 mg/day   Monitoring: [place] PMP: PDMP reviewed during this encounter.       Pharmacotherapy: No side-effects or adverse reactions reported. Compliance: No problems identified. Effectiveness: Clinically acceptable. Plan: Refer to "POC". UDS:  Summary  Date Value Ref Range Status  02/16/2020 FINAL  Final    Comment:    ==================================================================== [facility] COMP DRUG ANALYSIS,UR ==================================================================== Test  Result       Flag       Units Drug Present and Declared for Prescription  Verification   Tramadol                       5215         EXPECTED   ng/mg creat   O-Desmethyltramadol            6226         EXPECTED   ng/mg creat   N-Desmethyltramadol            1269         EXPECTED   ng/mg creat    Source of tramadol is a prescription medication.    O-desmethyltramadol and N-desmethyltramadol are expected    metabolites of tramadol.    Cyclobenzaprine                PRESENT      EXPECTED   Desmethylcyclobenzaprine       PRESENT      EXPECTED    Desmethylcyclobenzaprine is an expected metabolite of    cyclobenzaprine.  Drug Present not Declared for Prescription Verification   Ephedrine/Pseudoephedrine      PRESENT      UNEXPECTED   Phenylpropanolamine            PRESENT      UNEXPECTED    Source of ephedrine/pseudoephedrine is most commonly    pseudoephedrine in over-the-counter or prescription cold and    allergy medications. Phenylpropanolamine is an expected    metabolite of ephedrine/pseudoephedrine.    Orphenadrine                   PRESENT      UNEXPECTED   Ibuprofen                      PRESENT      UNEXPECTED   Diphenhydramine                PRESENT      UNEXPECTED ==================================================================== Test                      Result    Flag   Units      Ref Range   Creatinine              74               mg/dL      >=20 ==================================================================== Declared Medications:  The flagging and interpretation on this report are based on the  following declared medications.  Unexpected results may arise from  inaccuracies in the declared medications.   **Note: The testing scope of this panel includes these medications:   Cyclobenzaprine (Flexeril)  Tramadol (Ultram)   **Note: The testing scope of this panel does not include following  reported medications:   Atorvastatin (Lipitor)  Finasteride (Proscar)  Tamsulosin  (Flomax) ==================================================================== For clinical consultation, please call [phone]. ====================================================================    No results found for: "CBDTHCR", "D8THCCBX", "D9THCCBX"   Laboratory Chemistry Profile   Renal Lab Results  Component Value Date   BUN 26 (H) 03/01/2020   CREATININE 1.24 03/01/2020   BCR 18 02/15/2020   GFRAA 64 02/15/2020   GFRNONAA >60 03/01/2020    Hepatic Lab Results  Component Value Date   AST 45 (H) 03/01/2020   ALT 77 (H) 03/01/2020   ALBUMIN 3.6 03/01/2020  ALKPHOS 161 (H) 03/01/2020    Electrolytes Lab Results  Component Value Date   NA 136 03/01/2020   K 4.3 03/01/2020   CL 100 03/01/2020   CALCIUM 8.9 03/01/2020   MG 2.3 02/15/2020    Bone Lab Results  Component Value Date   25OHVITD1 37 02/15/2020   25OHVITD2 <1.0 02/15/2020   25OHVITD3 37 02/15/2020    Inflammation (CRP: Acute Phase) (ESR: Chronic Phase) Lab Results  Component Value Date   CRP 9 02/15/2020   ESRSEDRATE 19 02/15/2020         Note: Above Lab results reviewed.  Imaging  DG PAIN CLINIC C-ARM 1-60 MIN NO REPORT Fluoro was used, but no Radiologist interpretation will be provided.  Please refer to "NOTES" tab for provider progress note.  Assessment  There were no encounter diagnoses.  Plan of Care  Problem-specific:  No problem-specific Assessment & Plan notes found for this encounter.  Mr. Dyke Weible has a current medication list which includes the following long-term medication(s): atorvastatin.  Pharmacotherapy (Medications Ordered): No orders of the defined types were placed in this encounter.  Orders:  No orders of the defined types were placed in this encounter.  Follow-up plan:   No follow-ups on file.     Interventional Therapies  Risk  Complexity Considerations:   Estimated body mass index is 27.02 kg/m as calculated from the following:   Height as of  this encounter: '6\' 4"'  (1.93 m).   Weight as of this encounter: 222 lb (100.7 kg). Xarelto ANTICOAGULATION: (Stop: 3 days  Restart: 6 hours)   Planned  Pending:      Under consideration:   Diagnostic/therapeutic left L3-4 LESI #2  Therapeutic left L5 TFESI #3  Therapeutic right L3-4 LESI #2  Diagnostic left lumbar facet MBB #1    Completed:   Therapeutic left L5 TFESI x2 (04/25/2021) (100/100/100/90-100)  Therapeutic left L3-4 LESI x1 (02/16/2020) (90/90/65/65)  Therapeutic right L3-4 LESI x1 (04/25/2021) (100/100/100/90-100)    Therapeutic  Palliative (PRN) options:   Therapeutic left L5 TFESI + right L3-4 LESI      Recent Visits No visits were found meeting these conditions. Showing recent visits within past 90 days and meeting all other requirements Future Appointments Date Type Provider Dept  08/28/21 Office Visit Milinda Pointer, MD [facility]  Showing future appointments within next 90 days and meeting all other requirements  I discussed the assessment and treatment plan with the patient. The patient was provided an opportunity to ask questions and all were answered. The patient agreed with the plan and demonstrated an understanding of the instructions.  Patient advised to call back or seek an in-person evaluation if the symptoms or condition worsens.  Duration of encounter: *** minutes.  Note by: Gaspar Cola, MD Date: 08/28/2021; Time: 9:01 AM

## 2021-08-28 ENCOUNTER — Ambulatory Visit: Payer: Medicare Other | Attending: Pain Medicine | Admitting: Pain Medicine

## 2021-08-28 DIAGNOSIS — M48062 Spinal stenosis, lumbar region with neurogenic claudication: Secondary | ICD-10-CM

## 2021-08-28 DIAGNOSIS — M5417 Radiculopathy, lumbosacral region: Secondary | ICD-10-CM

## 2021-08-28 DIAGNOSIS — M51369 Other intervertebral disc degeneration, lumbar region without mention of lumbar back pain or lower extremity pain: Secondary | ICD-10-CM

## 2021-08-28 DIAGNOSIS — M79605 Pain in left leg: Secondary | ICD-10-CM

## 2021-08-28 DIAGNOSIS — M5136 Other intervertebral disc degeneration, lumbar region: Secondary | ICD-10-CM | POA: Diagnosis not present

## 2021-08-28 DIAGNOSIS — M48061 Spinal stenosis, lumbar region without neurogenic claudication: Secondary | ICD-10-CM

## 2021-08-28 DIAGNOSIS — M47816 Spondylosis without myelopathy or radiculopathy, lumbar region: Secondary | ICD-10-CM

## 2021-08-28 DIAGNOSIS — M5442 Lumbago with sciatica, left side: Secondary | ICD-10-CM

## 2021-08-28 DIAGNOSIS — M2428 Disorder of ligament, vertebrae: Secondary | ICD-10-CM

## 2021-08-28 DIAGNOSIS — M4316 Spondylolisthesis, lumbar region: Secondary | ICD-10-CM

## 2021-08-28 DIAGNOSIS — G8929 Other chronic pain: Secondary | ICD-10-CM

## 2021-08-28 DIAGNOSIS — R937 Abnormal findings on diagnostic imaging of other parts of musculoskeletal system: Secondary | ICD-10-CM

## 2021-08-28 DIAGNOSIS — Z7901 Long term (current) use of anticoagulants: Secondary | ICD-10-CM

## 2021-08-28 NOTE — Patient Instructions (Signed)
______________________________________________________________________  Preparing for Procedure with Sedation  NOTICE: Due to recent regulatory changes, starting on August 01, 2020, procedures requiring intravenous (IV) sedation will no longer be performed at the [facility].  These types of procedures are required to be performed at [facility] ambulatory surgery facility.  We are very sorry for the inconvenience.  Procedure appointments are limited to planned procedures: No Prescription Refills. No disability issues will be discussed. No medication changes will be discussed.  Instructions: Oral Intake: Do not eat or drink anything for at least 8 hours prior to your procedure. (Exception: Blood Pressure Medication. See below.) Transportation: A driver is required. You may not drive yourself after the procedure. Blood Pressure Medicine: Do not forget to take your blood pressure medicine with a sip of water the morning of the procedure. If your Diastolic (lower reading) is above 100 mmHg, elective cases will be cancelled/rescheduled. Blood thinners: These will need to be stopped for procedures. Notify our staff if you are taking any blood thinners. Depending on which one you take, there will be specific instructions on how and when to stop it. Diabetics on insulin: Notify the staff so that you can be scheduled 1st case in the morning. If your diabetes requires high dose insulin, take only  of your normal insulin dose the morning of the procedure and notify the staff that you have done so. Preventing infections: Shower with an antibacterial soap the morning of your procedure. Build-up your immune system: Take 1000 mg of Vitamin C with every meal (3 times a day) the day prior to your procedure. Antibiotics: Inform the staff if you have a condition or reason that requires you to take antibiotics before dental procedures. Pregnancy: If you are pregnant, call and cancel the procedure. Sickness: If  you have a cold, fever, or any active infections, call and cancel the procedure. Arrival: You must be in the facility at least 30 minutes prior to your scheduled procedure. Children: Do not bring children with you. Dress appropriately: There is always the possibility that your clothing may get soiled. Valuables: Do not bring any jewelry or valuables.  Reasons to call and reschedule or cancel your procedure: (Following these recommendations will minimize the risk of a serious complication.) Surgeries: Avoid having procedures within 2 weeks of any surgery. (Avoid for 2 weeks before or after any surgery). Flu Shots: Avoid having procedures within 2 weeks of a flu shots. (Avoid for 2 weeks before or after immunizations). Barium: Avoid having a procedure within 7-10 days after having had a radiological study involving the use of radiological contrast. (Myelograms, Barium swallow or enema study). Heart attacks: Avoid any elective procedures or surgeries for the initial 6 months after a "Myocardial Infarction" (Heart Attack). Blood thinners: It is imperative that you stop these medications before procedures. Let us know if you if you take any blood thinner.  Infection: Avoid procedures during or within two weeks of an infection (including chest colds or gastrointestinal problems). Symptoms associated with infections include: Localized redness, fever, chills, night sweats or profuse sweating, burning sensation when voiding, cough, congestion, stuffiness, runny nose, sore throat, diarrhea, nausea, vomiting, cold or Flu symptoms, recent or current infections. It is specially important if the infection is over the area that we intend to treat. Heart and lung problems: Symptoms that may suggest an active cardiopulmonary problem include: cough, chest pain, breathing difficulties or shortness of breath, dizziness, ankle swelling, uncontrolled high or unusually low blood pressure, and/or palpitations. If you are    experiencing any of these symptoms, cancel your procedure and contact your primary care physician for an evaluation.  Remember:  Regular Business hours are:  Monday to Thursday 8:00 AM to 4:00 PM  Provider's Schedule: Neylan Koroma, MD:  Procedure days: Tuesday and Thursday 7:30 AM to 4:00 PM  Bilal Lateef, MD:  Procedure days: Monday and Wednesday 7:30 AM to 4:00 PM ______________________________________________________________________  ____________________________________________________________________________________________  General Risks and Possible Complications  Patient Responsibilities: It is important that you read this as it is part of your informed consent. It is our duty to inform you of the risks and possible complications associated with treatments offered to you. It is your responsibility as a patient to read this and to ask questions about anything that is not clear or that you believe was not covered in this document.  Patient's Rights: You have the right to refuse treatment. You also have the right to change your mind, even after initially having agreed to have the treatment done. However, under this last option, if you wait until the last second to change your mind, you may be charged for the materials used up to that point.  Introduction: Medicine is not an exact science. Everything in Medicine, including the lack of treatment(s), carries the potential for danger, harm, or loss (which is by definition: Risk). In Medicine, a complication is a secondary problem, condition, or disease that can aggravate an already existing one. All treatments carry the risk of possible complications. The fact that a side effects or complications occurs, does not imply that the treatment was conducted incorrectly. It must be clearly understood that these can happen even when everything is done following the highest safety standards.  No treatment: You can choose not to proceed with the  proposed treatment alternative. The "PRO(s)" would include: avoiding the risk of complications associated with the therapy. The "CON(s)" would include: not getting any of the treatment benefits. These benefits fall under one of three categories: diagnostic; therapeutic; and/or palliative. Diagnostic benefits include: getting information which can ultimately lead to improvement of the disease or symptom(s). Therapeutic benefits are those associated with the successful treatment of the disease. Finally, palliative benefits are those related to the decrease of the primary symptoms, without necessarily curing the condition (example: decreasing the pain from a flare-up of a chronic condition, such as incurable terminal cancer).  General Risks and Complications: These are associated to most interventional treatments. They can occur alone, or in combination. They fall under one of the following six (6) categories: no benefit or worsening of symptoms; bleeding; infection; nerve damage; allergic reactions; and/or death. No benefits or worsening of symptoms: In Medicine there are no guarantees, only probabilities. No healthcare provider can ever guarantee that a medical treatment will work, they can only state the probability that it may. Furthermore, there is always the possibility that the condition may worsen, either directly, or indirectly, as a consequence of the treatment. Bleeding: This is more common if the patient is taking a blood thinner, either prescription or over the counter (example: Goody Powders, Fish oil, Aspirin, Garlic, etc.), or if suffering a condition associated with impaired coagulation (example: Hemophilia, cirrhosis of the liver, low platelet counts, etc.). However, even if you do not have one on these, it can still happen. If you have any of these conditions, or take one of these drugs, make sure to notify your treating physician. Infection: This is more common in patients with a compromised  immune system, either due to disease (example:   diabetes, cancer, human immunodeficiency virus [HIV], etc.), or due to medications or treatments (example: therapies used to treat cancer and rheumatological diseases). However, even if you do not have one on these, it can still happen. If you have any of these conditions, or take one of these drugs, make sure to notify your treating physician. Nerve Damage: This is more common when the treatment is an invasive one, but it can also happen with the use of medications, such as those used in the treatment of cancer. The damage can occur to small secondary nerves, or to large primary ones, such as those in the spinal cord and brain. This damage may be temporary or permanent and it may lead to impairments that can range from temporary numbness to permanent paralysis and/or brain death. Allergic Reactions: Any time a substance or material comes in contact with our body, there is the possibility of an allergic reaction. These can range from a mild skin rash (contact dermatitis) to a severe systemic reaction (anaphylactic reaction), which can result in death. Death: In general, any medical intervention can result in death, most of the time due to an unforeseen complication. ____________________________________________________________________________________________ ____________________________________________________________________________________________  Blood Thinners  IMPORTANT NOTICE:  If you take any of these, make sure to notify the nursing staff.  Failure to do so may result in injury.  Recommended time intervals to stop and restart blood-thinners, before & after invasive procedures  Generic Name Brand Name Pre-procedure. Stop this long before procedure. Post-procedure. Minimum waiting period before restarting.  Abciximab Reopro 15 days 2 hrs  Alteplase Activase 10 days 10 days  Anagrelide Agrylin    Apixaban Eliquis 3 days 6 hrs  Cilostazol Pletal  3 days 5 hrs  Clopidogrel Plavix 7-10 days 2 hrs  Dabigatran Pradaxa 5 days 6 hrs  Dalteparin Fragmin 24 hours 4 hrs  Dipyridamole Aggrenox 11days 2 hrs  Edoxaban Lixiana; Savaysa 3 days 2 hrs  Enoxaparin  Lovenox 24 hours 4 hrs  Eptifibatide Integrillin 8 hours 2 hrs  Fondaparinux  Arixtra 72 hours 12 hrs  Hydroxychloroquine Plaquenil 11 days   Prasugrel Effient 7-10 days 6 hrs  Reteplase Retavase 10 days 10 days  Rivaroxaban Xarelto 3 days 6 hrs  Ticagrelor Brilinta 5-7 days 6 hrs  Ticlopidine Ticlid 10-14 days 2 hrs  Tinzaparin Innohep 24 hours 4 hrs  Tirofiban Aggrastat 8 hours 2 hrs  Warfarin Coumadin 5 days 2 hrs   Other medications with blood-thinning effects  Product indications Generic (Brand) names Note  Cholesterol Lipitor Stop 4 days before procedure  Blood thinner (injectable) Heparin (LMW or LMWH Heparin) Stop 24 hours before procedure  Cancer Ibrutinib (Imbruvica) Stop 7 days before procedure  Malaria/Rheumatoid Hydroxychloroquine (Plaquenil) Stop 11 days before procedure  Thrombolytics  10 days before or after procedures   Over-the-counter (OTC) Products with blood-thinning effects  Product Common names Stop Time  Aspirin > 325 mg Goody Powders, Excedrin, etc. 11 days  Aspirin ? 81 mg  7 days  Fish oil  4 days  Garlic supplements  7 days  Ginkgo biloba  36 hours  Ginseng  24 hours  NSAIDs Ibuprofen, Naprosyn, etc. 3 days  Vitamin E  4 days   ____________________________________________________________________________________________  

## 2021-09-11 ENCOUNTER — Ambulatory Visit (INDEPENDENT_AMBULATORY_CARE_PROVIDER_SITE_OTHER): Payer: Medicare Other

## 2021-09-11 DIAGNOSIS — M47816 Spondylosis without myelopathy or radiculopathy, lumbar region: Secondary | ICD-10-CM

## 2021-09-11 DIAGNOSIS — M5442 Lumbago with sciatica, left side: Secondary | ICD-10-CM

## 2021-09-11 DIAGNOSIS — R937 Abnormal findings on diagnostic imaging of other parts of musculoskeletal system: Secondary | ICD-10-CM

## 2021-09-11 DIAGNOSIS — M48062 Spinal stenosis, lumbar region with neurogenic claudication: Secondary | ICD-10-CM | POA: Diagnosis not present

## 2021-09-11 DIAGNOSIS — M4316 Spondylolisthesis, lumbar region: Secondary | ICD-10-CM

## 2021-09-11 DIAGNOSIS — Z7901 Long term (current) use of anticoagulants: Secondary | ICD-10-CM

## 2021-09-11 DIAGNOSIS — M79605 Pain in left leg: Secondary | ICD-10-CM

## 2021-09-11 DIAGNOSIS — M2428 Disorder of ligament, vertebrae: Secondary | ICD-10-CM

## 2021-09-11 DIAGNOSIS — M48061 Spinal stenosis, lumbar region without neurogenic claudication: Secondary | ICD-10-CM

## 2021-09-11 DIAGNOSIS — G8929 Other chronic pain: Secondary | ICD-10-CM

## 2021-09-11 DIAGNOSIS — M5136 Other intervertebral disc degeneration, lumbar region: Secondary | ICD-10-CM | POA: Diagnosis not present

## 2021-09-11 DIAGNOSIS — M5417 Radiculopathy, lumbosacral region: Secondary | ICD-10-CM

## 2021-09-18 NOTE — Progress Notes (Unsigned)
The patient called to reschedule since he forgot to stop his Xarelto anticoagulation for the procedure.

## 2021-09-19 ENCOUNTER — Ambulatory Visit (HOSPITAL_BASED_OUTPATIENT_CLINIC_OR_DEPARTMENT_OTHER): Payer: Medicare Other | Admitting: Pain Medicine

## 2021-09-19 DIAGNOSIS — Z539 Procedure and treatment not carried out, unspecified reason: Secondary | ICD-10-CM

## 2021-09-19 NOTE — Patient Instructions (Signed)

## 2021-09-21 ENCOUNTER — Ambulatory Visit: Payer: Medicare Other | Admitting: Pain Medicine

## 2021-09-26 ENCOUNTER — Ambulatory Visit: Payer: Medicare Other | Attending: Pain Medicine | Admitting: Pain Medicine

## 2021-09-26 ENCOUNTER — Ambulatory Visit
Admission: RE | Admit: 2021-09-26 | Discharge: 2021-09-26 | Disposition: A | Discharge status: Home / Self Care | Payer: Medicare Other | Source: Ambulatory Visit | Attending: Pain Medicine | Admitting: Pain Medicine

## 2021-09-26 ENCOUNTER — Encounter: Payer: Self-pay | Admitting: Pain Medicine

## 2021-09-26 VITALS — BP 126/73 | HR 96 | Temp 97.3°F | Resp 18 | Ht 76.0 in | Wt 212.0 lb

## 2021-09-26 DIAGNOSIS — Z7901 Long term (current) use of anticoagulants: Secondary | ICD-10-CM | POA: Insufficient documentation

## 2021-09-26 DIAGNOSIS — M79605 Pain in left leg: Secondary | ICD-10-CM | POA: Diagnosis present

## 2021-09-26 DIAGNOSIS — R937 Abnormal findings on diagnostic imaging of other parts of musculoskeletal system: Secondary | ICD-10-CM | POA: Insufficient documentation

## 2021-09-26 DIAGNOSIS — M48062 Spinal stenosis, lumbar region with neurogenic claudication: Secondary | ICD-10-CM | POA: Insufficient documentation

## 2021-09-26 DIAGNOSIS — M48061 Spinal stenosis, lumbar region without neurogenic claudication: Secondary | ICD-10-CM | POA: Insufficient documentation

## 2021-09-26 DIAGNOSIS — M5136 Other intervertebral disc degeneration, lumbar region: Secondary | ICD-10-CM | POA: Insufficient documentation

## 2021-09-26 DIAGNOSIS — G8929 Other chronic pain: Secondary | ICD-10-CM

## 2021-09-26 DIAGNOSIS — M5442 Lumbago with sciatica, left side: Secondary | ICD-10-CM

## 2021-09-26 MED ORDER — MIDAZOLAM HCL 5 MG/5ML IJ SOLN
0.5000 mg | Freq: Once | INTRAMUSCULAR | Status: AC
  Administered 2021-09-26: 2 mg via INTRAVENOUS
  Filled 2021-09-26: qty 5

## 2021-09-26 MED ORDER — ROPIVACAINE HCL 2 MG/ML IJ SOLN
2.0000 mL | Freq: Once | INTRAMUSCULAR | Status: AC
  Administered 2021-09-26: 2 mL via EPIDURAL
  Filled 2021-09-26: qty 20

## 2021-09-26 MED ORDER — IOHEXOL 180 MG/ML  SOLN
10.0000 mL | Freq: Once | INTRAMUSCULAR | Status: AC
  Administered 2021-09-26: 10 mL via INTRATHECAL
  Filled 2021-09-26: qty 20

## 2021-09-26 MED ORDER — DEXAMETHASONE SODIUM PHOSPHATE 10 MG/ML IJ SOLN
20.0000 mg | Freq: Once | INTRAMUSCULAR | Status: AC
  Administered 2021-09-26: 20 mg
  Filled 2021-09-26: qty 2

## 2021-09-26 MED ORDER — SODIUM CHLORIDE (PF) 0.9 % IJ SOLN
INTRAMUSCULAR | Status: AC
  Filled 2021-09-26: qty 10

## 2021-09-26 MED ORDER — LACTATED RINGERS IV SOLN: Freq: Once | INTRAVENOUS | Status: AC

## 2021-09-26 MED ORDER — SODIUM CHLORIDE 0.9% FLUSH
2.0000 mL | Freq: Once | INTRAVENOUS | Status: AC
  Administered 2021-09-26: 2 mL

## 2021-09-26 MED ORDER — PENTAFLUOROPROP-TETRAFLUOROETH EX AERO
INHALATION_SPRAY | Freq: Once | CUTANEOUS | Status: AC
  Administered 2021-09-26: 30 via TOPICAL

## 2021-09-26 MED ORDER — LIDOCAINE HCL 2 % IJ SOLN
20.0000 mL | Freq: Once | INTRAMUSCULAR | Status: AC
  Administered 2021-09-26: 400 mg
  Filled 2021-09-26: qty 20

## 2021-09-26 NOTE — Patient Instructions (Signed)
____________________________________________________________________________________________  Virtual Visits   What is a "Virtual Visit"? It is a healthcare communication encounter (medical visit) that takes place on real time (NOT TEXT or E-MAIL) over the telephone or computer device (desktop, laptop, tablet, smart phone, etc.). It allows for more location flexibility between the patient and the healthcare provider.  Who decides when these types of visits will be used? The physician.  Who is eligible for these types of visits? Only those patients that can be reliably reached over the telephone.  What do you mean by reliably? We do not have time to call everyone multiple times, therefore those that tend to screen calls and then call back later are not suitable candidates for this system. We understand how people are reluctant to pickup on "unknown" calls, therefore, we suggest adding our telephone numbers to your list of "CONTACT(s)". This way, you should be able to readily identify our calls when you receive one. All of our numbers are available below.   Who is not eligible? This option is not available for medication management encounters, specially for controlled substances. Patients on pain medications that fall under the category of controlled substances have to come in for "Face-to-Face" encounters. This is required for mandatory monitoring of these substances. You may be asked to provide a sample for an unannounced urine drug screening test (UDS), and we will need to count your pain pills. Not bringing your pills to be counted may result in no refill. Obviously, neither one of these can be done over the phone.  When will this type of visits be used? You can request a virtual visit whenever you are physically unable to attend a regular appointment. The decision will be made by the physician (or healthcare provider) on a case by case basis.   At what time will I be called? This is an  excellent question. The providers will try to call you whenever they have time available. Do not expect to be called at any specific time. The secretaries will assign you a time for your virtual visit appointment, but this is done simply to keep a list of those patients that need to be called, but not for the purpose of keeping a time schedule. Be advised that the call may come in anytime during the day, between the hours of 8:00 AM and 8::00 PM, depending on provider availability. We do understand that the system is not perfect. If you are unable to be available that day on a moments notice, then request an "in-person" appointment rather than a "virtual visit".  Can I request my medication visits to be "Virtual"? Yes you may request it, but the decision is entirely up to the healthcare provider. Control substances require specific monitoring that requires Face-to-Face encounters. The number of encounters  and the extent of the monitoring is determined on a case by case basis.  Add a new contact to your smart phone and label it "PAIN CLINIC" Under this contact add the following numbers: Main: [phone] (Official Contact Number) Nurses: [phone] (These are outgoing only calling systems. Do not call this number.) Dr. Robyne Matar: [phone] or [phone] (Outgoing calls only. Do not call this number.)  ____________________________________________________________________________________________    ____________________________________________________________________________________________  Post-Procedure Discharge Instructions  Instructions: Apply ice:  Purpose: This will minimize any swelling and discomfort after procedure.  When: Day of procedure, as soon as you get home. How: Fill a plastic sandwich bag with crushed ice. Cover it with a small towel and apply   to injection site. How long: (15 min on, 15 min off) Apply for 15 minutes then remove x 15 minutes.  Repeat sequence on  day of procedure, until you go to bed. Apply heat:  Purpose: To treat any soreness and discomfort from the procedure. When: Starting the next day after the procedure. How: Apply heat to procedure site starting the day following the procedure. How long: May continue to repeat daily, until discomfort goes away. Food intake: Start with clear liquids (like water) and advance to regular food, as tolerated.  Physical activities: Keep activities to a minimum for the first 8 hours after the procedure. After that, then as tolerated. Driving: If you have received any sedation, be responsible and do not drive. You are not allowed to drive for 24 hours after having sedation. Blood thinner: (Applies only to those taking blood thinners) You may restart your blood thinner 6 hours after your procedure. Insulin: (Applies only to Diabetic patients taking insulin) As soon as you can eat, you may resume your normal dosing schedule. Infection prevention: Keep procedure site clean and dry. Shower daily and clean area with soap and water. Post-procedure Pain Diary: Extremely important that this be done correctly and accurately. Recorded information will be used to determine the next step in treatment. For the purpose of accuracy, follow these rules: Evaluate only the area treated. Do not report or include pain from an untreated area. For the purpose of this evaluation, ignore all other areas of pain, except for the treated area. After your procedure, avoid taking a long nap and attempting to complete the pain diary after you wake up. Instead, set your alarm clock to go off every hour, on the hour, for the initial 8 hours after the procedure. Document the duration of the numbing medicine, and the relief you are getting from it. Do not go to sleep and attempt to complete it later. It will not be accurate. If you received sedation, it is likely that you were given a medication that may cause amnesia. Because of this,  completing the diary at a later time may cause the information to be inaccurate. This information is needed to plan your care. Follow-up appointment: Keep your post-procedure follow-up evaluation appointment after the procedure (usually 2 weeks for most procedures, 6 weeks for radiofrequencies). DO NOT FORGET to bring you pain diary with you.   Expect: (What should I expect to see with my procedure?) From numbing medicine (AKA: Local Anesthetics): Numbness or decrease in pain. You may also experience some weakness, which if present, could last for the duration of the local anesthetic. Onset: Full effect within 15 minutes of injected. Duration: It will depend on the type of local anesthetic used. On the average, 1 to 8 hours.  From steroids (Applies only if steroids were used): Decrease in swelling or inflammation. Once inflammation is improved, relief of the pain will follow. Onset of benefits: Depends on the amount of swelling present. The more swelling, the longer it will take for the benefits to be seen. In some cases, up to 10 days. Duration: Steroids will stay in the system x 2 weeks. Duration of benefits will depend on multiple posibilities including persistent irritating factors. Side-effects: If present, they may typically last 2 weeks (the duration of the steroids). Frequent: Cramps (if they occur, drink Gatorade and take over-the-counter Magnesium 450-500 mg once to twice a day); water retention with temporary weight gain; increases in blood sugar; decreased immune system response; increased appetite. Occasional: Facial flushing (  red, warm cheeks); mood swings; menstrual changes. Uncommon: Long-term decrease or suppression of natural hormones; bone thinning. (These are more common with higher doses or more frequent use. This is why we prefer that our patients avoid having any injection therapies in other practices.)  Very Rare: Severe mood changes; psychosis; aseptic necrosis. From  procedure: Some discomfort is to be expected once the numbing medicine wears off. This should be minimal if ice and heat are applied as instructed.  Call if: (When should I call?) You experience numbness and weakness that gets worse with time, as opposed to wearing off. New onset bowel or bladder incontinence. (Applies only to procedures done in the spine)  Emergency Numbers: Durning business hours (Monday - Thursday, 8:00 AM - 4:00 PM) (Friday, 9:00 AM - 12:00 Noon): [phone] After hours: [phone] NOTE: If you are having a problem and are unable connect with, or to talk to a provider, then go to your nearest urgent care or emergency department. If the problem is serious and urgent, please call 911. ____________________________________________________________________________________________    

## 2021-09-26 NOTE — Progress Notes (Signed)
PROVIDER NOTE: Interpretation of information contained herein should be left to medically-trained personnel. Specific patient instructions are provided elsewhere under "Patient Instructions" section of medical record. This document was created in part using STT-dictation technology, any transcriptional errors that may result from this process are unintentional.  Patient: Todd Bradford Type: Established DOB: 29-Jun-1950 MRN: [id number] PCP: Daun Peacock, NP  Service: Procedure DOS: 09/26/2021 Setting: Ambulatory Location: Ambulatory outpatient facility Delivery: Face-to-face Provider: Gaspar Cola, MD Specialty: Interventional Pain Management Specialty designation: 09 Location: Outpatient facility Ref. Prov.: Daun Peacock, NP    Primary Reason for Visit: Interventional Pain Management Treatment. CC: Back Pain   Procedure:           Type: Lumbar trans-foraminal epidural steroid injection (L-TFESI) #1  Laterality: Left (-LT)  Level: L3 & L4 nerve root(s) Imaging: Fluoroscopy-guided         Anesthesia: Local anesthesia (1-2% Lidocaine) Anxiolysis: IV Versed         Sedation: No Sedation                       DOS: 09/26/2021  Performed by: Gaspar Cola, MD  Purpose: Diagnostic/Therapeutic Indications: Lumbar radicular pain severe enough to impact quality of life or function. 1. 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 Chronic lower extremity pain (2ry area of Pain) (Left)   3. DDD (degenerative disc disease), lumbar   4. 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 Abnormal MRI, lumbar spine (01/16/2020)   7. Chronic anticoagulation (Xarelto)    NAS-11 Pain score:   Pre-procedure: 4 /10   Post-procedure: 0-No pain/10     Position / Prep / Materials:  Position: Prone  Prep solution: DuraPrep (Iodine Povacrylex [0.7% available iodine] and  Isopropyl Alcohol, 74% w/w) Prep Area: Entire Posterior Lumbosacral Area.  From the lower tip of the scapula down to the tailbone and from flank to flank. Materials:  Tray: Block Needle(s):  Type: Spinal  Gauge (G): 22  Length: 5-in  Qty: 2  Pre-op H&P Assessment:  Mr. Pasqual is a 71 y.o. (year old), male patient, seen today for interventional treatment. He  has a past surgical history that includes Neck surgery (2000 approx) and Spine surgery. Mr. Glorioso has a current medication list which includes the following prescription(s): vitamin c, atorvastatin, finasteride, rivaroxaban, tamsulosin, and tramadol. His primarily concern today is the Back Pain  Initial Vital Signs:  Pulse/HCG Rate: 96ECG Heart Rate: 92 Temp: (!) 97.3 F (36.3 C) Resp: 18 BP: 121/77 SpO2: 100 %  BMI: Estimated body mass index is 25.81 kg/m as calculated from the following:   Height as of this encounter: '6\' 4"'$  (1.93 m).   Weight as of this encounter: 212 lb (96.2 kg).  Risk Assessment: Allergies: Reviewed. He has No Known Allergies.  Allergy Precautions: None required Coagulopathies: Reviewed. None identified.  Blood-thinner therapy: None at this time Active Infection(s): Reviewed. None identified. Mr. Lamping is afebrile  Site Confirmation: Mr. Capistran was asked to confirm the procedure and laterality before marking the site Procedure checklist: Completed Consent: Before the procedure and under the influence of no sedative(s), amnesic(s), or anxiolytics, the patient was informed of the treatment options, risks and possible complications. To fulfill our ethical and legal obligations, as recommended by the American Medical Association's Code of Ethics, I have informed the patient of my clinical  impression; the nature and purpose of the treatment or procedure; the risks, benefits, and possible complications of the intervention; the alternatives, including doing nothing; the risk(s) and benefit(s) of the alternative  treatment(s) or procedure(s); and the risk(s) and benefit(s) of doing nothing. The patient was provided information about the general risks and possible complications associated with the procedure. These may include, but are not limited to: failure to achieve desired goals, infection, bleeding, organ or nerve damage, allergic reactions, paralysis, and death. In addition, the patient was informed of those risks and complications associated to Spine-related procedures, such as failure to decrease pain; infection (i.e.: Meningitis, epidural or intraspinal abscess); bleeding (i.e.: epidural hematoma, subarachnoid hemorrhage, or any other type of intraspinal or peri-dural bleeding); organ or nerve damage (i.e.: Any type of peripheral nerve, nerve root, or spinal cord injury) with subsequent damage to sensory, motor, and/or autonomic systems, resulting in permanent pain, numbness, and/or weakness of one or several areas of the body; allergic reactions; (i.e.: anaphylactic reaction); and/or death. Furthermore, the patient was informed of those risks and complications associated with the medications. These include, but are not limited to: allergic reactions (i.e.: anaphylactic or anaphylactoid reaction(s)); adrenal axis suppression; blood sugar elevation that in diabetics may result in ketoacidosis or comma; water retention that in patients with history of congestive heart failure may result in shortness of breath, pulmonary edema, and decompensation with resultant heart failure; weight gain; swelling or edema; medication-induced neural toxicity; particulate matter embolism and blood vessel occlusion with resultant organ, and/or nervous system infarction; and/or aseptic necrosis of one or more joints. Finally, the patient was informed that Medicine is not an exact science; therefore, there is also the possibility of unforeseen or unpredictable risks and/or possible complications that may result in a catastrophic  outcome. The patient indicated having understood very clearly. We have given the patient no guarantees and we have made no promises. Enough time was given to the patient to ask questions, all of which were answered to the patient's satisfaction. Mr. Fason has indicated that he wanted to continue with the procedure. Attestation: I, the ordering provider, attest that I have discussed with the patient the benefits, risks, side-effects, alternatives, likelihood of achieving goals, and potential problems during recovery for the procedure that I have provided informed consent. Date  Time: 09/26/2021 10:30 AM  Pre-Procedure Preparation:  Monitoring: As per clinic protocol. Respiration, ETCO2, SpO2, BP, heart rate and rhythm monitor placed and checked for adequate function Safety Precautions: Patient was assessed for positional comfort and pressure points before starting the procedure. Time-out: I initiated and conducted the "Time-out" before starting the procedure, as per protocol. The patient was asked to participate by confirming the accuracy of the "Time Out" information. Verification of the correct person, site, and procedure were performed and confirmed by me, the nursing staff, and the patient. "Time-out" conducted as per Joint Commission's Universal Protocol (UP.01.01.01). Time: 1212  Description/Narrative of Procedure:          Target: The 6 o'clock position under the pedicle, on the affected side. Region: Posterolateral Lumbosacral Approach: Posterior Percutaneous Paravertebral approach.  Rationale (medical necessity): procedure needed and proper for the diagnosis and/or treatment of the patient's medical symptoms and needs. Procedural Technique Safety Precautions: Aspiration looking for blood return was conducted prior to all injections. At no point did we inject any substances, as a needle was being advanced. No attempts were made at seeking any paresthesias. Safe injection practices and needle  disposal techniques used. Medications properly checked for  expiration dates. SDV (single dose vial) medications used. Description of the Procedure: Protocol guidelines were followed. The patient was placed in position over the procedure table. The target area was identified and the area prepped in the usual manner. Skin & deeper tissues infiltrated with local anesthetic. Appropriate amount of time allowed to pass for local anesthetics to take effect. The procedure needles were then advanced to the target area. Proper needle placement secured. Negative aspiration confirmed. Solution injected in intermittent fashion, asking for systemic symptoms every 0.5cc of injectate. The needles were then removed and the area cleansed, making sure to leave some of the prepping solution back to take advantage of its long term bactericidal properties.  Vitals:   09/26/21 1213 09/26/21 1218 09/26/21 1220 09/26/21 1226  BP: 128/87 127/71 128/87 126/73  Pulse:      Resp: '18 16 18 18  '$ Temp:      SpO2: 100% 100% 100% 100%  Weight:      Height:        Start Time: 1212 hrs. End Time: 1220 hrs.  Imaging Guidance (Spinal):          Type of Imaging Technique: Fluoroscopy Guidance (Spinal) Indication(s): Assistance in needle guidance and placement for procedures requiring needle placement in or near specific anatomical locations not easily accessible without such assistance. Exposure Time: Please see nurses notes. Contrast: Before injecting any contrast, we confirmed that the patient did not have an allergy to iodine, shellfish, or radiological contrast. Once satisfactory needle placement was completed at the desired level, radiological contrast was injected. Contrast injected under live fluoroscopy. No contrast complications. See chart for type and volume of contrast used. Fluoroscopic Guidance: I was personally present during the use of fluoroscopy. "Tunnel Vision Technique" used to obtain the best possible view of the  target area. Parallax error corrected before commencing the procedure. "Direction-depth-direction" technique used to introduce the needle under continuous pulsed fluoroscopy. Once target was reached, antero-posterior, oblique, and lateral fluoroscopic projection used confirm needle placement in all planes. Images permanently stored in EMR. Interpretation: I personally interpreted the imaging intraoperatively. Adequate needle placement confirmed in multiple planes. Appropriate spread of contrast into desired area was observed. No evidence of afferent or efferent intravascular uptake. No intrathecal or subarachnoid spread observed. Permanent images saved into the patient's record.  Antibiotic Prophylaxis:   Anti-infectives (From admission, onward)    None      Indication(s): None identified  Post-operative Assessment:  Post-procedure Vital Signs:  Pulse/HCG Rate: 9696 Temp: (!) 97.3 F (36.3 C) Resp: 18 BP: 126/73 SpO2: 100 %  EBL: None  Complications: No immediate post-treatment complications observed by team, or reported by patient.  Note: The patient tolerated the entire procedure well. A repeat set of vitals were taken after the procedure and the patient was kept under observation following institutional policy, for this type of procedure. Post-procedural neurological assessment was performed, showing return to baseline, prior to discharge. The patient was provided with post-procedure discharge instructions, including a section on how to identify potential problems. Should any problems arise concerning this procedure, the patient was given instructions to immediately contact us, at any time, without hesitation. In any case, we plan to contact the patient by telephone for a follow-up status report regarding this interventional procedure.  Comments:  No additional relevant information.  Plan of Care  Orders:  Orders Placed This Encounter  Procedures   Lumbar Transforaminal Epidural     Scheduling Instructions:     Laterality: Left-sided  Level(s): L2 & L3     Sedation: Patient's choice.     Timeframe: Today    Order Specific Question:   Where will this procedure be performed?    Answer:   [facility]   [facility] C-ARM 1-60 MIN NO REPORT    Intraoperative interpretation by procedural physician at [facility].    Standing Status:   Standing    Number of Occurrences:   1    Order Specific Question:   Reason for exam:    Answer:   Assistance in needle guidance and placement for procedures requiring needle placement in or near specific anatomical locations not easily accessible without such assistance.   Informed Consent Details: Physician/Practitioner Attestation; Transcribe to consent form and obtain patient signature    Provider Attestation: I, Amada Acres Dossie Arbour, MD, (Pain Management Specialist), the physician/practitioner, attest that I have discussed with the patient the benefits, risks, side effects, alternatives, likelihood of achieving goals and potential problems during recovery for the procedure that I have provided informed consent.    Scheduling Instructions:     Note: Always confirm laterality of pain with Mr. Hedgepath, before procedure.     Transcribe to consent form and obtain patient signature.    Order Specific Question:   Physician/Practitioner attestation of informed consent for procedure/surgical case    Answer:   I, the physician/practitioner, attest that I have discussed with the patient the benefits, risks, side effects, alternatives, likelihood of achieving goals and potential problems during recovery for the procedure that I have provided informed consent.    Order Specific Question:   Procedure    Answer:   Diagnostic lumbar transforaminal epidural steroid injection under fluoroscopic guidance. (See notes for level and laterality.)    Order Specific Question:   Physician/Practitioner performing the procedure    Answer:    Candus Braud A. 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    Please confirm that the patient has stopped the Xarelto (Rivaroxaban) x 3 days prior to procedure or surgery.    Standing Status:   Standing    Number of Occurrences:   1   Provide equipment / supplies at bedside    "Block Tray" (Disposable  single use) Needle type: SpinalSpinal Amount/quantity: 2 Size: Medium (5-inch) Gauge: 22G    Standing Status:   Standing    Number of Occurrences:   1    Order Specific Question:   Specify    Answer:   Block Tray   Bleeding precautions    Standing Status:   Standing    Number of Occurrences:   1   Chronic Opioid Analgesic:  No chronic opioid analgesics therapy prescribed by our practice. Tramadol 50 mg, half a tablet p.o. daily MME/day: 2.5 mg/day   Medications ordered for procedure: Meds ordered this encounter  Medications   iohexol (OMNIPAQUE) 180 MG/ML injection 10 mL    Must be Myelogram-compatible. If not available, you may substitute with a water-soluble, non-ionic, hypoallergenic, myelogram-compatible radiological contrast medium.   lidocaine (XYLOCAINE) 2 % (with pres) injection 400 mg   pentafluoroprop-tetrafluoroeth (GEBAUERS) aerosol   lactated ringers infusion   midazolam (VERSED) 5 MG/5ML injection 0.5-2 mg    Make sure Flumazenil is available in the pyxis when using this medication. If  oversedation occurs, administer 0.2 mg IV over 15 sec. If after 45 sec no response, administer 0.2 mg again over 1 min; may repeat at 1 min intervals; not to exceed 4 doses (1 mg)   sodium chloride flush (NS) 0.9 % injection 2 mL    This is for a two (2) level block. Use two (2) syringes and divide content in half.   ropivacaine (PF) 2 mg/mL (0.2%) (NAROPIN)  injection 2 mL    This is for a two (2) level block. Use two (2) syringes and divide content in half.   dexamethasone (DECADRON) injection 20 mg    This is for a two (2) level block. Use two (2) syringes and divide content in half.   Medications administered: We administered iohexol, lidocaine, pentafluoroprop-tetrafluoroeth, lactated ringers, midazolam, sodium chloride flush, ropivacaine (PF) 2 mg/mL (0.2%), and dexamethasone.  See the medical record for exact dosing, route, and time of administration.  Follow-up plan:   Return in about 2 weeks (around 10/10/2021) for Proc-day (T,Th), (F2F), (PPE).       Interventional Therapies  Risk  Complexity Considerations:   Estimated body mass index is 27.02 kg/m as calculated from the following:   Height as of this encounter: '6\' 4"'$  (1.93 m).   Weight as of this encounter: 222 lb (100.7 kg). Xarelto ANTICOAGULATION: (Stop: 3 days  Restart: 6 hours)   Planned  Pending:   Repeat Lumbar MRI due to worsening of symptoms Diagnostic/therapeutic left L3 & L4 TESI #1 (09/26/2021)    Under consideration:   Diagnostic/therapeutic left L3-4 LESI #2  Therapeutic left L5 TFESI #3  Therapeutic right L3-4 LESI #2  Diagnostic left lumbar facet MBB #1    Completed:   Therapeutic left L5 TFESI x2 (04/25/2021) (100/100/100/90-100)  Therapeutic left L3-4 LESI x1 (02/16/2020) (90/90/65/65)  Therapeutic right L3-4 LESI x1 (04/25/2021) (100/100/100/90-100)    Therapeutic  Palliative (PRN) options:   Therapeutic left L5 TFESI + right L3-4 LESI      Recent Visits Date Type Provider Dept  08/28/21 Office Visit Milinda Pointer, MD [facility]  Showing recent visits within past 90 days and meeting all other requirements Today's Visits Date Type Provider Dept  09/26/21 Procedure visit Milinda Pointer, MD [facility]  Showing today's visits and meeting all other requirements Future Appointments Date Type Provider Dept  10/17/21  Appointment Milinda Pointer, MD [facility]  Showing future appointments within next 90 days and meeting all other requirements  Disposition: Discharge home  Discharge (Date  Time): 09/26/2021; 1231 hrs.   Primary Care Physician: Daun Peacock, NP Location: [facility] Note by: Gaspar Cola, MD Date: 09/26/2021; Time: 1:38 PM  Disclaimer:  Medicine is not an Chief Strategy Officer. The only guarantee in medicine is that nothing is guaranteed. It is important to note that the decision to proceed with this intervention was based on the information collected from the patient. The Data and conclusions were drawn from the patient's questionnaire, the interview, and the physical examination. Because the information was provided in large part by the patient, it cannot be guaranteed that it has not been purposely or unconsciously manipulated. Every effort has been made to obtain as much relevant data as possible for this evaluation. It is important to note that the conclusions that lead to this procedure are derived in large part from the available data. Always take into account that the treatment will also be dependent on availability of resources and existing treatment guidelines,  considered by other Pain Management Practitioners as being common knowledge and practice, at the time of the intervention. For Medico-Legal purposes, it is also important to point out that variation in procedural techniques and pharmacological choices are the acceptable norm. The indications, contraindications, technique, and results of the above procedure should only be interpreted and judged by a Board-Certified Interventional Pain Specialist with extensive familiarity and expertise in the same exact procedure and technique.

## 2021-09-27 ENCOUNTER — Telehealth: Payer: Self-pay

## 2021-09-27 NOTE — Telephone Encounter (Signed)
Post procedure phone call.  Voicemail has not been set up yet.

## 2021-10-13 ENCOUNTER — Telehealth: Payer: Self-pay

## 2021-10-13 NOTE — Telephone Encounter (Signed)
He had a procedure on 9/26 and has a follow up on 10/17. Because he lives so far away now, he wants to know if he can just come in and get another procedure without doing a follow up. He would like to get a procedure when he comes in on 10/17. Please call and let him know if this is possible.

## 2021-10-14 NOTE — Progress Notes (Deleted)
PROVIDER NOTE: Information contained herein reflects review and annotations entered in association with encounter. Interpretation of such information and data should be left to medically-trained personnel. Information provided to patient can be located elsewhere in the medical record under "Patient Instructions". Document created using STT-dictation technology, any transcriptional errors that may result from process are unintentional.    Patient: Todd Bradford  Service Category: E/M  Provider: Gaspar Cola, MD  DOB: April 09, 1950  DOS: 10/17/2021  Referring Provider: Daun Peacock, NP  MRN: [id number]  Specialty: Interventional Pain Management  PCP: Daun Peacock, NP  Type: Established Patient  Setting: Ambulatory outpatient    Location: Office  Delivery: Face-to-face     HPI  [name], a 71 y.o. year old male, is here today because of his No primary diagnosis found.. [name]'s primary complain today is No chief complaint on file. Last encounter: My last encounter with him was on 09/26/2021. Pertinent problems: 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 Pain Assessment: Severity of   is reported as a  /10. Location:    / . Onset:  . Quality:  . Timing:  . Modifying factor(s):  .Marland KitchenVitals:  vitals were not taken for this visit.   Reason for encounter: post-procedure evaluation and assessment. ***  Post-procedure evaluation   Type: Lumbar trans-foraminal epidural steroid injection (L-TFESI) #1  Laterality: Left (-LT)  Level: L3 & L4 nerve root(s) Imaging: Fluoroscopy-guided         Anesthesia: Local anesthesia (1-2% Lidocaine) Anxiolysis: IV Versed         Sedation: No Sedation                       DOS: 09/26/2021  Performed by: FGaspar Cola MD  Purpose: Diagnostic/Therapeutic Indications: Lumbar radicular pain severe enough to impact quality of life or function. 1. 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 Chronic lower extremity pain (2ry area of Pain) (Left)   3. DDD (degenerative disc disease), lumbar   4. 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 Abnormal MRI, lumbar spine (01/16/2020)   7. Chronic anticoagulation (Xarelto)    NAS-11 Pain score:   Pre-procedure: 4 /10   Post-procedure: 0-No pain/10      Effectiveness:  Initial hour after procedure:   ***. Subsequent 4-6 hours post-procedure:   ***. Analgesia past initial 6 hours:   ***. Ongoing improvement:  Analgesic:  *** Function:    ***    ROM:    ***     Pharmacotherapy Assessment  Analgesic: No chronic opioid analgesics therapy prescribed by our practice. Tramadol 50 mg, half a  tablet p.o. daily MME/day: 2.5 mg/day   Monitoring: [place] PMP: PDMP reviewed during this encounter.       Pharmacotherapy: No side-effects or adverse reactions reported. Compliance: No problems identified. Effectiveness: Clinically acceptable.  No notes on  file  No results found for: "CBDTHCR" No results found for: "D8THCCBX" No results found for: "D9THCCBX"  UDS:  Summary  Date Value Ref Range Status  02/16/2020 FINAL  Final    Comment:    ==================================================================== [facility] COMP DRUG ANALYSIS,UR ==================================================================== Test                             Result       Flag       Units Drug Present and Declared for Prescription Verification   Tramadol                       5215         EXPECTED   ng/mg creat   O-Desmethyltramadol            6226         EXPECTED   ng/mg creat   N-Desmethyltramadol            1269         EXPECTED   ng/mg creat    Source of tramadol is a prescription medication.    O-desmethyltramadol and N-desmethyltramadol are expected    metabolites of tramadol.    Cyclobenzaprine                PRESENT      EXPECTED   Desmethylcyclobenzaprine       PRESENT      EXPECTED    Desmethylcyclobenzaprine is an expected metabolite of    cyclobenzaprine.  Drug Present not Declared for Prescription Verification   Ephedrine/Pseudoephedrine      PRESENT      UNEXPECTED   Phenylpropanolamine            PRESENT      UNEXPECTED    Source of ephedrine/pseudoephedrine is most commonly    pseudoephedrine in over-the-counter or prescription cold and    allergy medications. Phenylpropanolamine is an expected    metabolite of ephedrine/pseudoephedrine.    Orphenadrine                   PRESENT      UNEXPECTED   Ibuprofen                      PRESENT      UNEXPECTED   Diphenhydramine                PRESENT      UNEXPECTED ==================================================================== Test                      Result    Flag   Units      Ref Range   Creatinine              74               mg/dL      >=20 ==================================================================== Declared Medications:  The flagging and interpretation on this  report are based on the  following declared medications.  Unexpected results may arise from  inaccuracies in the declared medications.   **Note: The testing scope of this panel includes these medications:   Cyclobenzaprine (Flexeril)  Tramadol (  Ultram)   **Note: The testing scope of this panel does not include following  reported medications:   Atorvastatin (Lipitor)  Finasteride (Proscar)  Tamsulosin (Flomax) ==================================================================== For clinical consultation, please call [phone]. ====================================================================       ROS  Constitutional: Denies any fever or chills Gastrointestinal: No reported hemesis, hematochezia, vomiting, or acute GI distress Musculoskeletal: Denies any acute onset joint swelling, redness, loss of ROM, or weakness Neurological: No reported episodes of acute onset apraxia, aphasia, dysarthria, agnosia, amnesia, paralysis, loss of coordination, or loss of consciousness  Medication Review  atorvastatin, finasteride, rivaroxaban, tamsulosin, traMADol, and vitamin C  History Review  Allergy: Mr. Fullen has No Known Allergies. Drug: Mr. Duhe  has no history on file for drug use. Alcohol:  has no history on file for alcohol use. Tobacco:  reports that he quit smoking about 16 years ago. His smoking use included cigarettes. He has a 60.00 pack-year smoking history. He has never used smokeless tobacco. Social: Mr. Omlor  reports that he quit smoking about 16 years ago. His smoking use included cigarettes. He has a 60.00 pack-year smoking history. He has never used smokeless tobacco. Medical:  has a past medical history of Hyperlipidemia. Surgical: Mr. Hollerbach  has a past surgical history that includes Neck surgery (2000 approx) and Spine surgery. Family: family history includes Heart disease in his mother; Stroke in his father.  Laboratory Chemistry Profile   Renal Lab  Results  Component Value Date   BUN 26 (H) 03/01/2020   CREATININE 1.24 03/01/2020   BCR 18 02/15/2020   GFRAA 64 02/15/2020   GFRNONAA >60 03/01/2020    Hepatic Lab Results  Component Value Date   AST 45 (H) 03/01/2020   ALT 77 (H) 03/01/2020   ALBUMIN 3.6 03/01/2020   ALKPHOS 161 (H) 03/01/2020    Electrolytes Lab Results  Component Value Date   NA 136 03/01/2020   K 4.3 03/01/2020   CL 100 03/01/2020   CALCIUM 8.9 03/01/2020   MG 2.3 02/15/2020    Bone Lab Results  Component Value Date   25OHVITD1 37 02/15/2020   25OHVITD2 <1.0 02/15/2020   25OHVITD3 37 02/15/2020    Inflammation (CRP: Acute Phase) (ESR: Chronic Phase) Lab Results  Component Value Date   CRP 9 02/15/2020   ESRSEDRATE 19 02/15/2020         Note: Above Lab results reviewed.  Recent Imaging Review  DG PAIN CLINIC C-ARM 1-60 MIN NO REPORT Fluoro was used, but no Radiologist interpretation will be provided.  Please refer to "NOTES" tab for provider progress note. Note: Reviewed        Physical Exam  General appearance: Well nourished, well developed, and well hydrated. In no apparent acute distress Mental status: Alert, oriented x 3 (person, place, & time)       Respiratory: No evidence of acute respiratory distress Eyes: PERLA Vitals: There were no vitals taken for this visit. BMI: Estimated body mass index is 25.81 kg/m as calculated from the following:   Height as of 09/26/21: '6\' 4"'  (1.93 m).   Weight as of 09/26/21: 212 lb (96.2 kg). Ideal: Patient weight not recorded  Assessment   Diagnosis Status  No diagnosis found. Controlled Controlled Controlled   Updated Problems: No problems updated.   Plan of Care  Problem-specific:  No problem-specific Assessment & Plan notes found for this encounter.  Mr. Mattis Featherly has a current medication list which includes the following long-term medication(s): atorvastatin.  Pharmacotherapy (Medications Ordered): No  orders of the defined  types were placed in this encounter.  Orders:  No orders of the defined types were placed in this encounter.  Follow-up plan:   No follow-ups on file.     Interventional Therapies  Risk  Complexity Considerations:   Estimated body mass index is 27.02 kg/m as calculated from the following:   Height as of this encounter: '6\' 4"'  (1.93 m).   Weight as of this encounter: 222 lb (100.7 kg). Xarelto ANTICOAGULATION: (Stop: 3 days  Restart: 6 hours)   Planned  Pending:   Repeat Lumbar MRI due to worsening of symptoms Diagnostic/therapeutic left L3 & L4 TESI #1 (09/26/2021)    Under consideration:   Diagnostic/therapeutic left L3-4 LESI #2  Therapeutic left L5 TFESI #3  Therapeutic right L3-4 LESI #2  Diagnostic left lumbar facet MBB #1    Completed:   Therapeutic left L5 TFESI x2 (04/25/2021) (100/100/100/90-100)  Therapeutic left L3-4 LESI x1 (02/16/2020) (90/90/65/65)  Therapeutic right L3-4 LESI x1 (04/25/2021) (100/100/100/90-100)    Therapeutic  Palliative (PRN) options:   Therapeutic left L5 TFESI + right L3-4 LESI       Recent Visits Date Type Provider Dept  09/26/21 Procedure visit Milinda Pointer, MD [facility]  08/28/21 Office Visit Milinda Pointer, MD [facility]  Showing recent visits within past 90 days and meeting all other requirements Future Appointments Date Type Provider Dept  10/17/21 Appointment Milinda Pointer, MD [facility]  Showing future appointments within next 90 days and meeting all other requirements  I discussed the assessment and treatment plan with the patient. The patient was provided an opportunity to ask questions and all were answered. The patient agreed with the plan and demonstrated an understanding of the instructions.  Patient advised to call back or seek an in-person evaluation if the symptoms or condition worsens.  Duration of encounter: *** minutes.  Total time on encounter, as per AMA  guidelines included both the face-to-face and non-face-to-face time personally spent by the physician and/or other qualified health care professional(s) on the day of the encounter (includes time in activities that require the physician or other qualified health care professional and does not include time in activities normally performed by clinical staff). Physician's time may include the following activities when performed: preparing to see the patient (eg, review of tests, pre-charting review of records) obtaining and/or reviewing separately obtained history performing a medically appropriate examination and/or evaluation counseling and educating the patient/family/caregiver ordering medications, tests, or procedures referring and communicating with other health care professionals (when not separately reported) documenting clinical information in the electronic or other health record independently interpreting results (not separately reported) and communicating results to the patient/ family/caregiver care coordination (not separately reported)  Note by: Gaspar Cola, MD Date: 10/17/2021; Time: 8:53 AM

## 2021-10-17 ENCOUNTER — Ambulatory Visit: Payer: Medicare Other | Admitting: Pain Medicine

## 2022-01-28 NOTE — Progress Notes (Unsigned)
Patient: Todd Bradford  Service Category: E/M  Provider: Gaspar Cola, MD  DOB: 1950/05/24  DOS: 01/29/2022  Location: Office  MRN: [id number]  Setting: Ambulatory outpatient  Referring Provider: Daun Peacock, NP  Type: Established Patient  Specialty: Interventional Pain Management  PCP: Daun Peacock, NP  Location: Remote location  Delivery: TeleHealth     Virtual Encounter - Pain Management PROVIDER NOTE: Information contained herein reflects review and annotations entered in association with encounter. Interpretation of such information and data should be left to medically-trained personnel. Information provided to patient can be located elsewhere in the medical record under "Patient Instructions". Document created using STT-dictation technology, any transcriptional errors that may result from process are unintentional.    Contact & Pharmacy Preferred: [phone] Home: [phone] (home) Mobile: [phone] (mobile) E-mail: [email or web address]  [place], [place] [address] Phone: [phone] Fax: [place], [place] [address] Phone: [phone] Fax: [phone]   Pre-screening  Mr. Reamer offered "in-person" vs "virtual" encounter. He indicated preferring virtual for this encounter.   Reason COVID-19*  Social distancing based on CDC and AMA recommendations.   I contacted [name] on 01/29/2022 via telephone.      I clearly identified myself as Gaspar Cola, MD. I verified that I was speaking with the correct person using two identifiers (Name: Jaremy Nosal, and date of birth: [birth year]).  Consent I sought verbal advanced consent from [name] for virtual visit interactions. I informed Mr. Eckenrode of possible security and privacy concerns, risks, and limitations associated with providing  "not-in-person" medical evaluation and management services. I also informed Mr. Nordin of the availability of "in-person" appointments. Finally, I informed him that there would be a charge for the virtual visit and that he could be  personally, fully or partially, financially responsible for it. Mr. Grosso expressed understanding and agreed to proceed.   Historic Elements   Mr. Keylen Eckenrode is a 72 y.o. year old, male patient evaluated today after our last contact on 09/26/2021. Mr. Kahre  has a past medical history of Hyperlipidemia. He also  has a past surgical history that includes Neck surgery (2000 approx) and Spine surgery. Mr. Nuttall has a current medication list which includes the following prescription(s): vitamin c, atorvastatin, finasteride, rivaroxaban, tamsulosin, and tramadol. He  reports that he quit smoking about 17 years ago. His smoking use included cigarettes. He has a 60.00 pack-year smoking history. He has never used smokeless tobacco. No history on file for alcohol use and drug use. Mr. Devita has No Known Allergies.  Estimated body mass index is 25.81 kg/m as calculated from the following:   Height as of 09/26/21: '6\' 4"'$  (1.93 m).   Weight as of 09/26/21: 212 lb (96.2 kg).  HPI  Today, he is being contacted for  the patient indicates having a flareup of his feet pain and low back pain.  In the case of the lower back his worst side is the left and he indicates that he has very little pain if anything on the right.  Indicates of his feet this pain seems to be worse than his lower back, it is bilateral, and he indicates that the pain is equally as bad on the right as it is on the left.  This seems to have progressed from before where he  was having primarily left lower extremity pain.  The patient indicates that the pain in his feet is worse at night and he is having pain and numbness that seems to be affecting the bottom of his feet as well as the big toe and what appears to be a  combination of an L5 and an S1 bilateral radiculitis/radiculopathy.  Post-procedure evaluation   Type: Lumbar trans-foraminal epidural steroid injection (L-TFESI) #1  Laterality: Left (-LT)  Level: L3 & L4 nerve root(s) Imaging: Fluoroscopy-guided         Anesthesia: Local anesthesia (1-2% Lidocaine) Anxiolysis: IV Versed         Sedation: No Sedation                       DOS: 09/26/2021  Performed by: Gaspar Cola, MD  Purpose: Diagnostic/Therapeutic Indications: Lumbar radicular pain severe enough to impact quality of life or function. 1. 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 Chronic lower extremity pain (2ry area of Pain) (Left)   3. DDD (degenerative disc disease), lumbar   4. 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 Abnormal MRI, lumbar spine (01/16/2020)   7. Chronic anticoagulation (Xarelto)    NAS-11 Pain score:   Pre-procedure: 4 /10   Post-procedure: 0-No pain/10      Effectiveness:  Initial hour after procedure: 100 %. Subsequent 4-6 hours post-procedure: 100 %. Analgesia past initial 6 hours: 100 % (lasting 2 months). Ongoing improvement:  Analgesic: The patient indicates having attained a 100% relief of the pain the lasted for 2 months.  After that he has slowly began to come back. Function: Mr. Mcfarlane reports improvement in function ROM: Mr. Verdell reports improvement in ROM   Pharmacotherapy Assessment   Opioid Analgesic: No chronic opioid analgesics therapy prescribed by our practice. Tramadol 50 mg, half a tablet p.o. daily MME/day: 2.5 mg/day   Monitoring: [place] PMP: PDMP not reviewed this encounter.       Pharmacotherapy: No side-effects or adverse reactions reported. Compliance: No problems identified. Effectiveness: Clinically acceptable. Plan: Refer to "POC". UDS:  Summary  Date Value Ref Range Status   02/16/2020 FINAL  Final    Comment:    ==================================================================== [facility] COMP DRUG ANALYSIS,UR ==================================================================== Test                             Result       Flag       Units Drug Present and Declared for Prescription Verification   Tramadol                       5215         EXPECTED   ng/mg creat   O-Desmethyltramadol            6226         EXPECTED   ng/mg creat   N-Desmethyltramadol            1269         EXPECTED   ng/mg creat    Source of tramadol is a prescription medication.    O-desmethyltramadol and N-desmethyltramadol are expected    metabolites of tramadol.    Cyclobenzaprine                PRESENT  EXPECTED   Desmethylcyclobenzaprine       PRESENT      EXPECTED    Desmethylcyclobenzaprine is an expected metabolite of    cyclobenzaprine.  Drug Present not Declared for Prescription Verification   Ephedrine/Pseudoephedrine      PRESENT      UNEXPECTED   Phenylpropanolamine            PRESENT      UNEXPECTED    Source of ephedrine/pseudoephedrine is most commonly    pseudoephedrine in over-the-counter or prescription cold and    allergy medications. Phenylpropanolamine is an expected    metabolite of ephedrine/pseudoephedrine.    Orphenadrine                   PRESENT      UNEXPECTED   Ibuprofen                      PRESENT      UNEXPECTED   Diphenhydramine                PRESENT      UNEXPECTED ==================================================================== Test                      Result    Flag   Units      Ref Range   Creatinine              74               mg/dL      >=20 ==================================================================== Declared Medications:  The flagging and interpretation on this report are based on the  following declared medications.  Unexpected results may arise from  inaccuracies in the declared medications.   **Note: The  testing scope of this panel includes these medications:   Cyclobenzaprine (Flexeril)  Tramadol (Ultram)   **Note: The testing scope of this panel does not include following  reported medications:   Atorvastatin (Lipitor)  Finasteride (Proscar)  Tamsulosin (Flomax) ==================================================================== For clinical consultation, please call [phone]. ====================================================================    No results found for: "CBDTHCR", "D8THCCBX", "D9THCCBX"   Laboratory Chemistry Profile   Renal Lab Results  Component Value Date   BUN 26 (H) 03/01/2020   CREATININE 1.24 03/01/2020   BCR 18 02/15/2020   GFRAA 64 02/15/2020   GFRNONAA >60 03/01/2020    Hepatic Lab Results  Component Value Date   AST 45 (H) 03/01/2020   ALT 77 (H) 03/01/2020   ALBUMIN 3.6 03/01/2020   ALKPHOS 161 (H) 03/01/2020    Electrolytes Lab Results  Component Value Date   NA 136 03/01/2020   K 4.3 03/01/2020   CL 100 03/01/2020   CALCIUM 8.9 03/01/2020   MG 2.3 02/15/2020    Bone Lab Results  Component Value Date   25OHVITD1 37 02/15/2020   25OHVITD2 <1.0 02/15/2020   25OHVITD3 37 02/15/2020    Inflammation (CRP: Acute Phase) (ESR: Chronic Phase) Lab Results  Component Value Date   CRP 9 02/15/2020   ESRSEDRATE 19 02/15/2020         Note: Above Lab results reviewed.  Imaging  DG PAIN CLINIC C-ARM 1-60 MIN NO REPORT Fluoro was used, but no Radiologist interpretation will be provided.  Please refer to "NOTES" tab for provider progress note.  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  Plan of Care  Problem-specific:  No problem-specific Assessment & Plan notes found for this encounter.  Mr. Daxon Kyne has a current medication list which includes the following long-term medication(s): atorvastatin.  Pharmacotherapy (Medications Ordered): No orders of the defined types were placed in this encounter.  Orders:  Orders Placed This Encounter  Procedures   Lumbar Epidural Injection    Standing Status:   Future    Standing Expiration Date:   04/30/2022    Scheduling Instructions:     Procedure: Interlaminar Lumbar Epidural Steroid injection (LESI)  L5-S1     Laterality: Left-sided     Sedation: Patient's choice.     Timeframe: ASAA    Order Specific Question:   Where will this procedure be performed?    Answer:   [facility]   Blood Thinner Instructions to Nursing    Always make sure patient has clearance from prescribing physician to stop blood thinners for interventional therapies. If the patient requires a Lovenox-bridge therapy, make sure arrangements are made to institute it with the assistance of the PCP.    Scheduling Instructions:     Have Mr. Vilardi stop the Xarelto (Rivaroxaban) x 3 days prior to procedure or surgery.   Follow-up plan:   Return for [facility]): (L) L5-S1 LESI #1, (Blood Thinner Protocol).     Interventional Therapies  Risk  Complexity Considerations:   Xarelto ANTICOAGULATION: (Stop: 3 days  Restart: 6 hours)   Planned  Pending:   Therapeutic left L5-S1 LESI #1    Under consideration:      Completed:   Diagnostic/therapeutic left L3 & L4 TESI x1 (09/26/2021) (100/100/100/100 x 2 months) Therapeutic left L5 TFESI x2 (04/25/2021) (100/100/100/90-100)  Therapeutic left L3-4 LESI x1 (02/16/2020) (90/90/65/65)  Therapeutic right L3-4 LESI x1 (04/25/2021) (100/100/100/90-100)    Therapeutic  Palliative (PRN) options:          Recent Visits No visits were found meeting these conditions. Showing recent visits within past 90 days and meeting all other requirements Today's Visits Date Type Provider Dept  01/29/22 Office Visit Milinda Pointer, MD [facility]  Showing today's visits and meeting all other requirements Future Appointments No visits were found meeting these conditions. Showing future appointments within next 90 days and meeting all other requirements  I discussed the assessment and treatment plan with the patient. The patient was provided an opportunity to ask questions and all were answered. The patient agreed with the plan and demonstrated an understanding of the instructions.  Patient advised to call back or seek an in-person evaluation if the symptoms or condition worsens.  Duration of encounter: 20 minutes.  Note by: Gaspar Cola, MD Date: 01/29/2022; Time: 10:27 AM

## 2022-01-29 ENCOUNTER — Ambulatory Visit: Payer: Medicare Other | Attending: Pain Medicine | Admitting: Pain Medicine

## 2022-01-29 DIAGNOSIS — M5417 Radiculopathy, lumbosacral region: Secondary | ICD-10-CM | POA: Diagnosis not present

## 2022-01-29 DIAGNOSIS — M5441 Lumbago with sciatica, right side: Secondary | ICD-10-CM

## 2022-01-29 DIAGNOSIS — M48062 Spinal stenosis, lumbar region with neurogenic claudication: Secondary | ICD-10-CM

## 2022-01-29 DIAGNOSIS — M5442 Lumbago with sciatica, left side: Secondary | ICD-10-CM

## 2022-01-29 DIAGNOSIS — R937 Abnormal findings on diagnostic imaging of other parts of musculoskeletal system: Secondary | ICD-10-CM

## 2022-01-29 DIAGNOSIS — Z7901 Long term (current) use of anticoagulants: Secondary | ICD-10-CM

## 2022-01-29 DIAGNOSIS — M5137 Other intervertebral disc degeneration, lumbosacral region: Secondary | ICD-10-CM

## 2022-01-29 DIAGNOSIS — G8929 Other chronic pain: Secondary | ICD-10-CM | POA: Insufficient documentation

## 2022-01-29 DIAGNOSIS — M48061 Spinal stenosis, lumbar region without neurogenic claudication: Secondary | ICD-10-CM

## 2022-01-29 NOTE — Patient Instructions (Signed)
______________________________________________________________________  Procedure instructions  Do not eat or drink fluids (other than water) for 6 hours before your procedure  No water for 2 hours before your procedure  Take your blood pressure medicine with a sip of water  Arrive 30 minutes before your appointment  Carefully read the "Preparing for your procedure" detailed instructions  If you have questions call us at [phone]  _____________________________________________________________________    ______________________________________________________________________  Preparing for your procedure  During your procedure appointment there will be: No Prescription Refills. No disability issues to discussed. No medication changes or discussions.  Instructions: Food intake: Avoid eating anything solid for at least 8 hours prior to your procedure. Clear liquid intake: You may take clear liquids such as water up to 2 hours prior to your procedure. (No carbonated drinks. No soda.) Transportation: Unless otherwise stated by your physician, bring a driver. Morning Medicines: Except for blood thinners, take all of your other morning medications with a sip of water. Make sure to take your heart and blood pressure medicines. If your blood pressure's lower number is above 100, the case will be rescheduled. Blood thinners: Make sure to stop your blood thinners as instructed.  If you take a blood thinner, but were not instructed to stop it, call our office [phone] and ask to talk to a nurse. Not stopping a blood thinner prior to certain procedures could lead to serious complications. Diabetics on insulin: Notify the staff so that you can be scheduled 1st case in the morning. If your diabetes requires high dose insulin, take only  of your normal insulin dose the morning of the procedure and notify the staff that you have done so. Preventing infections: Shower with an  antibacterial soap the morning of your procedure.  Build-up your immune system: Take 1000 mg of Vitamin C with every meal (3 times a day) the day prior to your procedure. Antibiotics: Inform the nursing staff if you are taking any antibiotics or if you have any conditions that may require antibiotics prior to procedures. (Example: recent joint implants)   Pregnancy: If you are pregnant make sure to notify the nursing staff. Not doing so may result in injury to the fetus, including death.  Sickness: If you have a cold, fever, or any active infections, call and cancel or reschedule your procedure. Receiving steroids while having an infection may result in complications. Arrival: You must be in the facility at least 30 minutes prior to your scheduled procedure. Tardiness: Your scheduled time is also the cutoff time. If you do not arrive at least 15 minutes prior to your procedure, you will be rescheduled.  Children: Do not bring any children with you. Make arrangements to keep them home. Dress appropriately: There is always a possibility that your clothing may get soiled. Avoid long dresses. Valuables: Do not bring any jewelry or valuables.  Reasons to call and reschedule or cancel your procedure: (Following these recommendations will minimize the risk of a serious complication.) Surgeries: Avoid having procedures within 2 weeks of any surgery. (Avoid for 2 weeks before or after any surgery). Flu Shots: Avoid having procedures within 2 weeks of a flu shots or . (Avoid for 2 weeks before or after immunizations). Barium: Avoid having a procedure within 7-10 days after having had a radiological study involving the use of radiological contrast. (Myelograms, Barium swallow or enema study). Heart attacks: Avoid any elective procedures or surgeries for the initial 6 months after a "Myocardial Infarction" (Heart Attack). Blood thinners: It  is imperative that you stop these medications before procedures. Let us  know if you if you take any blood thinner.  Infection: Avoid procedures during or within two weeks of an infection (including chest colds or gastrointestinal problems). Symptoms associated with infections include: Localized redness, fever, chills, night sweats or profuse sweating, burning sensation when voiding, cough, congestion, stuffiness, runny nose, sore throat, diarrhea, nausea, vomiting, cold or Flu symptoms, recent or current infections. It is specially important if the infection is over the area that we intend to treat. Heart and lung problems: Symptoms that may suggest an active cardiopulmonary problem include: cough, chest pain, breathing difficulties or shortness of breath, dizziness, ankle swelling, uncontrolled high or unusually low blood pressure, and/or palpitations. If you are experiencing any of these symptoms, cancel your procedure and contact your primary care physician for an evaluation.  Remember:  Regular Business hours are:  Monday to Thursday 8:00 AM to 4:00 PM  Provider's Schedule: Milinda Pointer, MD:  Procedure days: Tuesday and Thursday 7:30 AM to 4:00 PM  Gillis Santa, MD:  Procedure days: Monday and Wednesday 7:30 AM to 4:00 PM  ______________________________________________________________________    ____________________________________________________________________________________________  General Risks and Possible Complications  Patient Responsibilities: It is important that you read this as it is part of your informed consent. It is our duty to inform you of the risks and possible complications associated with treatments offered to you. It is your responsibility as a patient to read this and to ask questions about anything that is not clear or that you believe was not covered in this document.  Patient's Rights: You have the right to refuse treatment. You also have the right to change your mind, even after initially having agreed to have the treatment  done. However, under this last option, if you wait until the last second to change your mind, you may be charged for the materials used up to that point.  Introduction: Medicine is not an Chief Strategy Officer. Everything in Medicine, including the lack of treatment(s), carries the potential for danger, harm, or loss (which is by definition: Risk). In Medicine, a complication is a secondary problem, condition, or disease that can aggravate an already existing one. All treatments carry the risk of possible complications. The fact that a side effects or complications occurs, does not imply that the treatment was conducted incorrectly. It must be clearly understood that these can happen even when everything is done following the highest safety standards.  No treatment: You can choose not to proceed with the proposed treatment alternative. The "PRO(s)" would include: avoiding the risk of complications associated with the therapy. The "CON(s)" would include: not getting any of the treatment benefits. These benefits fall under one of three categories: diagnostic; therapeutic; and/or palliative. Diagnostic benefits include: getting information which can ultimately lead to improvement of the disease or symptom(s). Therapeutic benefits are those associated with the successful treatment of the disease. Finally, palliative benefits are those related to the decrease of the primary symptoms, without necessarily curing the condition (example: decreasing the pain from a flare-up of a chronic condition, such as incurable terminal cancer).  General Risks and Complications: These are associated to most interventional treatments. They can occur alone, or in combination. They fall under one of the following six (6) categories: no benefit or worsening of symptoms; bleeding; infection; nerve damage; allergic reactions; and/or death. No benefits or worsening of symptoms: In Medicine there are no guarantees, only probabilities. No  healthcare provider can ever guarantee that a  medical treatment will work, they can only state the probability that it may. Furthermore, there is always the possibility that the condition may worsen, either directly, or indirectly, as a consequence of the treatment. Bleeding: This is more common if the patient is taking a blood thinner, either prescription or over the counter (example: Goody Powders, Fish oil, Aspirin, Garlic, etc.), or if suffering a condition associated with impaired coagulation (example: Hemophilia, cirrhosis of the liver, low platelet counts, etc.). However, even if you do not have one on these, it can still happen. If you have any of these conditions, or take one of these drugs, make sure to notify your treating physician. Infection: This is more common in patients with a compromised immune system, either due to disease (example: diabetes, cancer, human immunodeficiency virus [HIV], etc.), or due to medications or treatments (example: therapies used to treat cancer and rheumatological diseases). However, even if you do not have one on these, it can still happen. If you have any of these conditions, or take one of these drugs, make sure to notify your treating physician. Nerve Damage: This is more common when the treatment is an invasive one, but it can also happen with the use of medications, such as those used in the treatment of cancer. The damage can occur to small secondary nerves, or to large primary ones, such as those in the spinal cord and brain. This damage may be temporary or permanent and it may lead to impairments that can range from temporary numbness to permanent paralysis and/or brain death. Allergic Reactions: Any time a substance or material comes in contact with our body, there is the possibility of an allergic reaction. These can range from a mild skin rash (contact dermatitis) to a severe systemic reaction (anaphylactic reaction), which can result in death. Death: In  general, any medical intervention can result in death, most of the time due to an unforeseen complication. ____________________________________________________________________________________________    ____________________________________________________________________________________________  Blood Thinners  IMPORTANT NOTICE:  If you take any of these, make sure to notify the nursing staff.  Failure to do so may result in injury.  Recommended time intervals to stop and restart blood-thinners, before & after invasive procedures  Generic Name Brand Name Pre-procedure. Stop this long before procedure. Post-procedure. Minimum waiting period before restarting.  Abciximab Reopro 15 days 2 hrs  Alteplase Activase 10 days 10 days  Anagrelide Agrylin    Apixaban Eliquis 3 days 6 hrs  Cilostazol Pletal 3 days 5 hrs  Clopidogrel Plavix 7-10 days 2 hrs  Dabigatran Pradaxa 5 days 6 hrs  Dalteparin Fragmin 24 hours 4 hrs  Dipyridamole Aggrenox 11days 2 hrs  Edoxaban Lixiana; Savaysa 3 days 2 hrs  Enoxaparin  Lovenox 24 hours 4 hrs  Eptifibatide Integrillin 8 hours 2 hrs  Fondaparinux  Arixtra 72 hours 12 hrs  Hydroxychloroquine Plaquenil 11 days   Prasugrel Effient 7-10 days 6 hrs  Reteplase Retavase 10 days 10 days  Rivaroxaban Xarelto 3 days 6 hrs  Ticagrelor Brilinta 5-7 days 6 hrs  Ticlopidine Ticlid 10-14 days 2 hrs  Tinzaparin Innohep 24 hours 4 hrs  Tirofiban Aggrastat 8 hours 2 hrs  Warfarin Coumadin 5 days 2 hrs   Other medications with blood-thinning effects  Product indications Generic (Brand) names Note  Cholesterol Lipitor Stop 4 days before procedure  Blood thinner (injectable) Heparin (LMW or LMWH Heparin) Stop 24 hours before procedure  Cancer Ibrutinib (Imbruvica) Stop 7 days before procedure  Malaria/Rheumatoid Hydroxychloroquine (Plaquenil) Stop 11 days  before procedure  Thrombolytics  10 days before or after procedures   Over-the-counter (OTC) Products with  blood-thinning effects  Product Common names Stop Time  Aspirin > 325 mg Goody Powders, Excedrin, etc. 11 days  Aspirin ? 81 mg  7 days  Fish oil  4 days  Garlic supplements  7 days  Ginkgo biloba  36 hours  Ginseng  24 hours  NSAIDs Ibuprofen, Naprosyn, etc. 3 days  Vitamin E  4 days   ____________________________________________________________________________________________

## 2022-02-04 NOTE — Progress Notes (Unsigned)
PROVIDER NOTE: Interpretation of information contained herein should be left to medically-trained personnel. Specific patient instructions are provided elsewhere under "Patient Instructions" section of medical record. This document was created in part using STT-dictation technology, any transcriptional errors that may result from this process are unintentional.  Patient: Todd Bradford Type: Established DOB: 1950-01-21 MRN: [id number] PCP: Daun Peacock, NP   Service: Procedure DOS: 02/06/2022 Setting: Ambulatory Location: Ambulatory outpatient facility Delivery: Face-to-face Provider: Gaspar Cola, MD Specialty: Interventional Pain Management Specialty designation: 09 Location: Outpatient facility Ref. Prov.: Daun Peacock, NP   Interventional Therapy     Type: Lumbar epidural steroid injection (LESI) (interlaminar) #1    Laterality: Left   Level:  L5-S1 Level.  Imaging: Fluoroscopic guidance         Anesthesia: Local anesthesia (1-2% Lidocaine) Anxiolysis: None                 Sedation: No Sedation                       DOS: 02/06/2022  Performed by: Gaspar Cola, MD  Purpose: Diagnostic/Therapeutic Indications: Lumbar radicular pain of intraspinal etiology of more than 4 weeks that has failed to respond to conservative therapy and is severe enough to impact quality of life or function. 1. 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 Chronic lower extremity pain (2ry area of Pain) (Left)   3. DDD (degenerative disc disease), lumbosacral   4. 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 Lumbosacral radiculopathy at L5 (Bilateral)   7. Lumbosacral radiculopathy at S1 (Bilateral)   8. Numbness and tingling of lower extremity (Left)   9. Anterolisthesis of lumbar spine L4/L5 (4m)   10. Abnormal MRI, lumbar spine (01/16/2020)   11. Chronic  anticoagulation (Xarelto)    NAS-11 Pain score:   Pre-procedure: 2 /10   Post-procedure: 0-No pain/10      Position / Prep / Materials:  Position: Prone w/ head of the table raised (slight reverse trendelenburg) to facilitate breathing.  Prep solution: DuraPrep (Iodine Povacrylex [0.7% available iodine] and Isopropyl Alcohol, 74% w/w) Prep Area: Entire Posterior Lumbar Region from lower scapular tip down to mid buttocks area and from flank to flank. Materials:  Tray: Epidural tray Needle(s):  Type: Epidural needle (Tuohy) Gauge (G):  17 Length: Regular (3.5-in) Qty: 1   Pre-op H&P Assessment:  [name] a 72y.o. (year old), male patient, seen today for interventional treatment. He  has a past surgical history that includes Neck surgery (2000 approx) and Spine surgery. [name] a current medication list which includes the following prescription(s): vitamin c, atorvastatin, finasteride, tamsulosin, tramadol, and rivaroxaban. His primarily concern today is the Back Pain  Initial Vital Signs:  Pulse/HCG Rate: 92  Temp: (!) 97.5 F (36.4 C) Resp: 18 BP: (!) 157/87 SpO2: 100 %  BMI: Estimated body mass index is 26.05 kg/m as calculated from the following:   Height as of this encounter: '6\' 4"'$  (1.93 m).   Weight as of this encounter: 214 lb (97.1 kg).  Risk Assessment: Allergies: Reviewed. He has No Known Allergies.  Allergy Precautions: None required Coagulopathies: Reviewed. None identified.  Blood-thinner therapy: None at this time Active Infection(s): Reviewed. None identified. [name] afebrile  Site Confirmation: [name] asked to confirm the procedure and laterality before marking  the site Procedure checklist: Completed Consent: Before the procedure and under the influence of no sedative(s), amnesic(s), or anxiolytics, the patient was informed of the treatment options, risks and possible complications. To fulfill our ethical and legal obligations, as  recommended by the American Medical Association's Code of Ethics, I have informed the patient of my clinical impression; the nature and purpose of the treatment or procedure; the risks, benefits, and possible complications of the intervention; the alternatives, including doing nothing; the risk(s) and benefit(s) of the alternative treatment(s) or procedure(s); and the risk(s) and benefit(s) of doing nothing. The patient was provided information about the general risks and possible complications associated with the procedure. These may include, but are not limited to: failure to achieve desired goals, infection, bleeding, organ or nerve damage, allergic reactions, paralysis, and death. In addition, the patient was informed of those risks and complications associated to Spine-related procedures, such as failure to decrease pain; infection (i.e.: Meningitis, epidural or intraspinal abscess); bleeding (i.e.: epidural hematoma, subarachnoid hemorrhage, or any other type of intraspinal or peri-dural bleeding); organ or nerve damage (i.e.: Any type of peripheral nerve, nerve root, or spinal cord injury) with subsequent damage to sensory, motor, and/or autonomic systems, resulting in permanent pain, numbness, and/or weakness of one or several areas of the body; allergic reactions; (i.e.: anaphylactic reaction); and/or death. Furthermore, the patient was informed of those risks and complications associated with the medications. These include, but are not limited to: allergic reactions (i.e.: anaphylactic or anaphylactoid reaction(s)); adrenal axis suppression; blood sugar elevation that in diabetics may result in ketoacidosis or comma; water retention that in patients with history of congestive heart failure may result in shortness of breath, pulmonary edema, and decompensation with resultant heart failure; weight gain; swelling or edema; medication-induced neural toxicity; particulate matter embolism and blood vessel  occlusion with resultant organ, and/or nervous system infarction; and/or aseptic necrosis of one or more joints. Finally, the patient was informed that Medicine is not an exact science; therefore, there is also the possibility of unforeseen or unpredictable risks and/or possible complications that may result in a catastrophic outcome. The patient indicated having understood very clearly. We have given the patient no guarantees and we have made no promises. Enough time was given to the patient to ask questions, all of which were answered to the patient's satisfaction. Mr. Maziarz has indicated that he wanted to continue with the procedure. Attestation: I, the ordering provider, attest that I have discussed with the patient the benefits, risks, side-effects, alternatives, likelihood of achieving goals, and potential problems during recovery for the procedure that I have provided informed consent. Date  Time: 02/06/2022  9:54 AM   Pre-Procedure Preparation:  Monitoring: As per clinic protocol. Respiration, ETCO2, SpO2, BP, heart rate and rhythm monitor placed and checked for adequate function Safety Precautions: Patient was assessed for positional comfort and pressure points before starting the procedure. Time-out: I initiated and conducted the "Time-out" before starting the procedure, as per protocol. The patient was asked to participate by confirming the accuracy of the "Time Out" information. Verification of the correct person, site, and procedure were performed and confirmed by me, the nursing staff, and the patient. "Time-out" conducted as per Joint Commission's Universal Protocol (UP.01.01.01). Time: 1051  Description/Narrative of Procedure:          Target: Epidural space via interlaminar opening, initially targeting the lower laminar border of the superior vertebral body. Region: Lumbar Approach: Percutaneous paravertebral  Rationale (medical necessity): procedure needed and proper for the  diagnosis and/or treatment of the patient's medical symptoms and needs. Procedural Technique Safety Precautions: Aspiration looking for blood return was conducted prior to all injections. At no point did we inject any substances, as a needle was being advanced. No attempts were made at seeking any paresthesias. Safe injection practices and needle disposal techniques used. Medications properly checked for expiration dates. SDV (single dose vial) medications used. Description of the Procedure: Protocol guidelines were followed. The procedure needle was introduced through the skin, ipsilateral to the reported pain, and advanced to the target area. Bone was contacted and the needle walked caudad, until the lamina was cleared. The epidural space was identified using "loss-of-resistance technique" with 2-3 ml of PF-NaCl (0.9% NSS), in a 5cc LOR glass syringe.  Vitals:   02/06/22 0949 02/06/22 1050 02/06/22 1055 02/06/22 1100  BP: (!) 157/87 (!) 163/100 (!) 159/99 (!) 151/90  Pulse: 92 96 98 99  Resp: '18 16 15 16  '$ Temp: (!) 97.5 F (36.4 C)     TempSrc: Temporal     SpO2: 100% 100% 100% 100%  Weight: 214 lb (97.1 kg)     Height: '6\' 4"'$  (1.93 m)       Start Time: 1051 hrs. End Time: 1057 hrs.  Imaging Guidance (Spinal):          Type of Imaging Technique: Fluoroscopy Guidance (Spinal) Indication(s): Assistance in needle guidance and placement for procedures requiring needle placement in or near specific anatomical locations not easily accessible without such assistance. Exposure Time: Please see nurses notes. Contrast: Before injecting any contrast, we confirmed that the patient did not have an allergy to iodine, shellfish, or radiological contrast. Once satisfactory needle placement was completed at the desired level, radiological contrast was injected. Contrast injected under live fluoroscopy. No contrast complications. See chart for type and volume of contrast used. Fluoroscopic Guidance: I was  personally present during the use of fluoroscopy. "Tunnel Vision Technique" used to obtain the best possible view of the target area. Parallax error corrected before commencing the procedure. "Direction-depth-direction" technique used to introduce the needle under continuous pulsed fluoroscopy. Once target was reached, antero-posterior, oblique, and lateral fluoroscopic projection used confirm needle placement in all planes. Images permanently stored in EMR. Interpretation: I personally interpreted the imaging intraoperatively. Adequate needle placement confirmed in multiple planes. Appropriate spread of contrast into desired area was observed. No evidence of afferent or efferent intravascular uptake. No intrathecal or subarachnoid spread observed. Permanent images saved into the patient's record.  Antibiotic Prophylaxis:   Anti-infectives (From admission, onward)    None      Indication(s): None identified  Post-operative Assessment:  Post-procedure Vital Signs:  Pulse/HCG Rate: 99  Temp: (!) 97.5 F (36.4 C) Resp: 16 BP: (!) 151/90 SpO2: 100 %  EBL: None  Complications: No immediate post-treatment complications observed by team, or reported by patient.  Note: The patient tolerated the entire procedure well. A repeat set of vitals were taken after the procedure and the patient was kept under observation following institutional policy, for this type of procedure. Post-procedural neurological assessment was performed, showing return to baseline, prior to discharge. The patient was provided with post-procedure discharge instructions, including a section on how to identify potential problems. Should any problems arise concerning this procedure, the patient was given instructions to immediately contact us, at any time, without hesitation. In any case, we plan to contact the patient by telephone for a follow-up status report regarding this interventional procedure.  Comments:  No additional  relevant information.  Plan of Care  Orders:  Orders Placed This Encounter  Procedures   Lumbar Epidural Injection    Scheduling Instructions:     Procedure: Interlaminar LESI L5-S1     Laterality: Left     Sedation: Patient's choice     Timeframe: Today    Order Specific Question:   Where will this procedure be performed?    Answer:   [facility]   DG [facility] C-ARM 1-60 MIN NO REPORT    Intraoperative interpretation by procedural physician at [place].    Standing Status:   Standing    Number of Occurrences:   1    Order Specific Question:   Reason for exam:    Answer:   Assistance in needle guidance and placement for procedures requiring needle placement in or near specific anatomical locations not easily accessible without such assistance.   Informed Consent Details: Physician/Practitioner Attestation; Transcribe to consent form and obtain patient signature    Note: Always confirm laterality of pain with Mr. Norkus, before procedure. Transcribe to consent form and obtain patient signature.    Order Specific Question:   Physician/Practitioner attestation of informed consent for procedure/surgical case    Answer:   I, the physician/practitioner, attest that I have discussed with the patient the benefits, risks, side effects, alternatives, likelihood of achieving goals and potential problems during recovery for the procedure that I have provided informed consent.    Order Specific Question:   Procedure    Answer:   Lumbar epidural steroid injection under fluoroscopic guidance    Order Specific Question:   Physician/Practitioner performing the procedure    Answer:   Amyiah Gaba A. Dossie Arbour, MD    Order Specific Question:   Indication/Reason    Answer:   Low back and/or lower extremity pain secondary to lumbar radiculitis   Care order/instruction: Please confirm that the patient has stopped the Xarelto (Rivaroxaban) x 3 days prior to procedure or surgery.     Please confirm that the patient has stopped the Xarelto (Rivaroxaban) x 3 days prior to procedure or surgery.    Standing Status:   Standing    Number of Occurrences:   1   Provide equipment / supplies at bedside    Procedural tray: Epidural Tray (Disposable  single use) Skin infiltration needle: Regular 1.5-in, 25-G, (x1) Block needle size: Regular standard Catheter: No catheter required    Standing Status:   Standing    Number of Occurrences:   1    Order Specific Question:   Specify    Answer:   Epidural Tray   Bleeding precautions    Standing Status:   Standing    Number of Occurrences:   1   Chronic Opioid Analgesic:  No chronic opioid analgesics therapy prescribed by our practice. Tramadol 50 mg, half a tablet p.o. daily MME/day: 2.5 mg/day   Medications ordered for procedure: Meds ordered this encounter  Medications   iohexol (OMNIPAQUE) 180 MG/ML injection 10 mL    Must be Myelogram-compatible. If not available, you may substitute with a water-soluble, non-ionic, hypoallergenic, myelogram-compatible radiological contrast medium.   lidocaine (XYLOCAINE) 2 % (with pres) injection 400 mg   pentafluoroprop-tetrafluoroeth (GEBAUERS) aerosol   sodium chloride flush (NS) 0.9 % injection 2 mL   ropivacaine (PF) 2 mg/mL (0.2%) (NAROPIN) injection 2 mL   triamcinolone acetonide (KENALOG-40) injection 40 mg   Medications administered: We administered iohexol, lidocaine, pentafluoroprop-tetrafluoroeth, sodium chloride flush, ropivacaine (PF) 2 mg/mL (0.2%), and triamcinolone acetonide.  See the medical record for exact dosing, route, and time of administration.  Follow-up plan:   Return in about 2 weeks (around 02/20/2022) for Proc-day (T,Th), (Face2F), (PPE).       Interventional Therapies  Risk  Complexity Considerations:   Xarelto ANTICOAGULATION: (Stop: 3 days  Restart: 6 hours)   Planned  Pending:   Therapeutic left L5-S1 LESI #1    Under consideration:       Completed:   Diagnostic/therapeutic left L3 & L4 TESI x1 (09/26/2021) (100/100/100/100 x 2 months) Therapeutic left L5 TFESI x2 (04/25/2021) (100/100/100/90-100)  Therapeutic left L3-4 LESI x1 (02/16/2020) (90/90/65/65)  Therapeutic right L3-4 LESI x1 (04/25/2021) (100/100/100/90-100)    Therapeutic  Palliative (PRN) options:          Recent Visits Date Type Provider Dept  01/29/22 Office Visit Milinda Pointer, MD [facility]  Showing recent visits within past 90 days and meeting all other requirements Today's Visits Date Type Provider Dept  02/06/22 Procedure visit Milinda Pointer, MD [facility]  Showing today's visits and meeting all other requirements Future Appointments Date Type Provider Dept  02/20/22 Appointment Milinda Pointer, MD [facility]  Showing future appointments within next 90 days and meeting all other requirements  Disposition: Discharge home  Discharge (Date  Time): 02/06/2022; 1101 hrs.   Primary Care Physician: Daun Peacock, NP Location: [facility] Note by: Gaspar Cola, MD Date: 02/06/2022; Time: 11:17 AM  Disclaimer:  Medicine is not an Chief Strategy Officer. The only guarantee in medicine is that nothing is guaranteed. It is important to note that the decision to proceed with this intervention was based on the information collected from the patient. The Data and conclusions were drawn from the patient's questionnaire, the interview, and the physical examination. Because the information was provided in large part by the patient, it cannot be guaranteed that it has not been purposely or unconsciously manipulated. Every effort has been made to obtain as much relevant data as possible for this evaluation. It is important to note that the conclusions that lead to this procedure are derived in large part from the available data. Always take into account that the treatment will also be dependent  on availability of resources and existing treatment guidelines, considered by other Pain Management Practitioners as being common knowledge and practice, at the time of the intervention. For Medico-Legal purposes, it is also important to point out that variation in procedural techniques and pharmacological choices are the acceptable norm. The indications, contraindications, technique, and results of the above procedure should only be interpreted and judged by a Board-Certified Interventional Pain Specialist with extensive familiarity and expertise in the same exact procedure and technique.

## 2022-02-06 ENCOUNTER — Encounter: Payer: Self-pay | Admitting: Pain Medicine

## 2022-02-06 ENCOUNTER — Ambulatory Visit
Admission: RE | Admit: 2022-02-06 | Discharge: 2022-02-06 | Disposition: A | Discharge status: Home / Self Care | Payer: Medicare Other | Source: Ambulatory Visit | Attending: Pain Medicine | Admitting: Pain Medicine

## 2022-02-06 ENCOUNTER — Ambulatory Visit: Payer: Medicare Other | Attending: Pain Medicine | Admitting: Pain Medicine

## 2022-02-06 VITALS — BP 151/90 | HR 99 | Temp 97.5°F | Resp 16 | Ht 76.0 in | Wt 214.0 lb

## 2022-02-06 DIAGNOSIS — R200 Anesthesia of skin: Secondary | ICD-10-CM

## 2022-02-06 DIAGNOSIS — M4316 Spondylolisthesis, lumbar region: Secondary | ICD-10-CM | POA: Diagnosis present

## 2022-02-06 DIAGNOSIS — M5137 Other intervertebral disc degeneration, lumbosacral region: Secondary | ICD-10-CM | POA: Diagnosis present

## 2022-02-06 DIAGNOSIS — M5442 Lumbago with sciatica, left side: Secondary | ICD-10-CM | POA: Diagnosis not present

## 2022-02-06 DIAGNOSIS — R937 Abnormal findings on diagnostic imaging of other parts of musculoskeletal system: Secondary | ICD-10-CM

## 2022-02-06 DIAGNOSIS — M48061 Spinal stenosis, lumbar region without neurogenic claudication: Secondary | ICD-10-CM

## 2022-02-06 DIAGNOSIS — R202 Paresthesia of skin: Secondary | ICD-10-CM | POA: Diagnosis present

## 2022-02-06 DIAGNOSIS — M79605 Pain in left leg: Secondary | ICD-10-CM | POA: Diagnosis present

## 2022-02-06 DIAGNOSIS — M51379 Other intervertebral disc degeneration, lumbosacral region without mention of lumbar back pain or lower extremity pain: Secondary | ICD-10-CM

## 2022-02-06 DIAGNOSIS — M48062 Spinal stenosis, lumbar region with neurogenic claudication: Secondary | ICD-10-CM | POA: Diagnosis present

## 2022-02-06 DIAGNOSIS — Z7901 Long term (current) use of anticoagulants: Secondary | ICD-10-CM | POA: Diagnosis present

## 2022-02-06 DIAGNOSIS — G8929 Other chronic pain: Secondary | ICD-10-CM | POA: Diagnosis present

## 2022-02-06 DIAGNOSIS — M5417 Radiculopathy, lumbosacral region: Secondary | ICD-10-CM

## 2022-02-06 MED ORDER — ROPIVACAINE HCL 2 MG/ML IJ SOLN
2.0000 mL | Freq: Once | INTRAMUSCULAR | Status: AC
  Administered 2022-02-06: 2 mL via EPIDURAL
  Filled 2022-02-06: qty 20

## 2022-02-06 MED ORDER — SODIUM CHLORIDE 0.9% FLUSH
2.0000 mL | Freq: Once | INTRAVENOUS | Status: AC
  Administered 2022-02-06: 2 mL

## 2022-02-06 MED ORDER — LIDOCAINE HCL 2 % IJ SOLN
20.0000 mL | Freq: Once | INTRAMUSCULAR | Status: AC
  Administered 2022-02-06: 400 mg
  Filled 2022-02-06: qty 20

## 2022-02-06 MED ORDER — TRIAMCINOLONE ACETONIDE 40 MG/ML IJ SUSP
40.0000 mg | Freq: Once | INTRAMUSCULAR | Status: AC
  Administered 2022-02-06: 40 mg
  Filled 2022-02-06: qty 1

## 2022-02-06 MED ORDER — PENTAFLUOROPROP-TETRAFLUOROETH EX AERO
INHALATION_SPRAY | Freq: Once | CUTANEOUS | Status: AC
  Administered 2022-02-06: 30 via TOPICAL

## 2022-02-06 MED ORDER — IOHEXOL 180 MG/ML  SOLN
10.0000 mL | Freq: Once | INTRAMUSCULAR | Status: AC
  Administered 2022-02-06: 10 mL via EPIDURAL
  Filled 2022-02-06: qty 20

## 2022-02-06 NOTE — Patient Instructions (Signed)

## 2022-02-06 NOTE — Progress Notes (Signed)
Safety precautions to be maintained throughout the outpatient stay will include: orient to surroundings, keep bed in low position, maintain call bell within reach at all times, provide assistance with transfer out of bed and ambulation.  

## 2022-02-07 ENCOUNTER — Telehealth: Payer: Self-pay

## 2022-02-07 NOTE — Telephone Encounter (Signed)
Post procedure follow up.  Patient states he is doing ok but is still having the numbness and tingling during night, but it has gotten better this morning.

## 2022-02-19 NOTE — Progress Notes (Unsigned)
PROVIDER NOTE: Information contained herein reflects review and annotations entered in association with encounter. Interpretation of such information and data should be left to medically-trained personnel. Information provided to patient can be located elsewhere in the medical record under "Patient Instructions". Document created using STT-dictation technology, any transcriptional errors that may result from process are unintentional.    Patient: Todd Bradford  Service Category: E/M  Provider: Gaspar Cola, MD  DOB: 1950-04-19  DOS: 02/20/2022  Referring Provider: Daun Peacock, NP  MRN: [id number]  Specialty: Interventional Pain Management  PCP: Daun Peacock, NP  Type: Established Patient  Setting: Ambulatory outpatient    Location: Office  Delivery: Face-to-face     HPI  [name], a 72 y.o. year old male, is here today because of his Chronic left-sided low back pain with left-sided sciatica [M54.42, G89.29]. [name] primary complain today is No chief complaint on file. Last encounter: My last encounter with him was on 02/06/2022. Pertinent problems: 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 Pain Assessment: Severity of   is reported as a  /10. Location:    / . Onset:  . Quality:  . Timing:  . Modifying factor(s):  .Marland KitchenVitals:  vitals were not taken for this visit.  BMI: Estimated body mass index is 26.05 kg/m as calculated from the following:   Height as of 02/06/22: 6' 4"$  (1.93 m).   Weight as of 02/06/22: 214 lb (97.1 kg).  Reason for encounter: post-procedure evaluation and assessment. ***  Post-procedure evaluation   Type: Lumbar epidural steroid injection (LESI) (interlaminar) #1    Laterality: Left   Level:  L5-S1 Level.  Imaging: Fluoroscopic guidance         Anesthesia: Local anesthesia (1-2% Lidocaine) Anxiolysis: None                 Sedation: No Sedation                       DOS: 02/06/2022  Performed by: FGaspar Cola MD  Purpose: Diagnostic/Therapeutic Indications: Lumbar radicular pain of intraspinal etiology of more than 4 weeks that has failed to respond to conservative therapy and is severe enough to impact quality of life or function. 1. 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 Chronic lower extremity pain (2ry area of Pain) (Left)   3. DDD (degenerative disc disease), lumbosacral   4. 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 Lumbosacral radiculopathy at L5 (Bilateral)   7. Lumbosacral radiculopathy at S1 (Bilateral)   8. Numbness and tingling of lower extremity (Left)  9. Anterolisthesis of lumbar spine L4/L5 (82m)   10. Abnormal MRI, lumbar spine (01/16/2020)   11. Chronic anticoagulation (Xarelto)    NAS-11 Pain score:   Pre-procedure: 2 /10   Post-procedure: 0-No pain/10      Effectiveness:  Initial hour after  procedure:   ***. Subsequent 4-6 hours post-procedure:   ***. Analgesia past initial 6 hours:   ***. Ongoing improvement:  Analgesic:  *** Function:    ***    ROM:    ***     Pharmacotherapy Assessment  Analgesic: No chronic opioid analgesics therapy prescribed by our practice. Tramadol 50 mg, half a tablet p.o. daily MME/day: 2.5 mg/day   Monitoring: [place] PMP: PDMP reviewed during this encounter.       Pharmacotherapy: No side-effects or adverse reactions reported. Compliance: No problems identified. Effectiveness: Clinically acceptable.  No notes on file  No results found for: "CBDTHCR" No results found for: "D8THCCBX" No results found for: "D9THCCBX"  UDS:  Summary  Date Value Ref Range Status  02/16/2020 FINAL  Final    Comment:    ==================================================================== [facility] COMP DRUG ANALYSIS,UR ==================================================================== Test                             Result       Flag       Units Drug Present and Declared for Prescription Verification   Tramadol                       5215         EXPECTED   ng/mg creat   O-Desmethyltramadol            6226         EXPECTED   ng/mg creat   N-Desmethyltramadol            1269         EXPECTED   ng/mg creat    Source of tramadol is a prescription medication.    O-desmethyltramadol and N-desmethyltramadol are expected    metabolites of tramadol.    Cyclobenzaprine                PRESENT      EXPECTED   Desmethylcyclobenzaprine       PRESENT      EXPECTED    Desmethylcyclobenzaprine is an expected metabolite of    cyclobenzaprine.  Drug Present not Declared for Prescription Verification   Ephedrine/Pseudoephedrine      PRESENT      UNEXPECTED   Phenylpropanolamine            PRESENT      UNEXPECTED    Source of ephedrine/pseudoephedrine is most commonly    pseudoephedrine in over-the-counter or prescription cold and    allergy medications.  Phenylpropanolamine is an expected    metabolite of ephedrine/pseudoephedrine.    Orphenadrine                   PRESENT      UNEXPECTED   Ibuprofen                      PRESENT      UNEXPECTED   Diphenhydramine                PRESENT      UNEXPECTED ==================================================================== Test  Result    Flag   Units      Ref Range   Creatinine              74               mg/dL      >=20 ==================================================================== Declared Medications:  The flagging and interpretation on this report are based on the  following declared medications.  Unexpected results may arise from  inaccuracies in the declared medications.   **Note: The testing scope of this panel includes these medications:   Cyclobenzaprine (Flexeril)  Tramadol (Ultram)   **Note: The testing scope of this panel does not include following  reported medications:   Atorvastatin (Lipitor)  Finasteride (Proscar)  Tamsulosin (Flomax) ==================================================================== For clinical consultation, please call [phone]. ====================================================================       ROS  Constitutional: Denies any fever or chills Gastrointestinal: No reported hemesis, hematochezia, vomiting, or acute GI distress Musculoskeletal: Denies any acute onset joint swelling, redness, loss of ROM, or weakness Neurological: No reported episodes of acute onset apraxia, aphasia, dysarthria, agnosia, amnesia, paralysis, loss of coordination, or loss of consciousness  Medication Review  atorvastatin, finasteride, rivaroxaban, tamsulosin, traMADol, and vitamin C  History Review  Allergy: Mr. Teitel has No Known Allergies. Drug: Mr. Welden  has no history on file for drug use. Alcohol:  has no history on file for alcohol use. Tobacco:  reports that he quit smoking about 17 years ago. His smoking use  included cigarettes. He has a 60.00 pack-year smoking history. He has never used smokeless tobacco. Social: Mr. Macvicar  reports that he quit smoking about 17 years ago. His smoking use included cigarettes. He has a 60.00 pack-year smoking history. He has never used smokeless tobacco. Medical:  has a past medical history of Hyperlipidemia. Surgical: Mr. Velador  has a past surgical history that includes Neck surgery (2000 approx) and Spine surgery. Family: family history includes Heart disease in his mother; Stroke in his father.  Laboratory Chemistry Profile   Renal Lab Results  Component Value Date   BUN 26 (H) 03/01/2020   CREATININE 1.24 03/01/2020   BCR 18 02/15/2020   GFRAA 64 02/15/2020   GFRNONAA >60 03/01/2020    Hepatic Lab Results  Component Value Date   AST 45 (H) 03/01/2020   ALT 77 (H) 03/01/2020   ALBUMIN 3.6 03/01/2020   ALKPHOS 161 (H) 03/01/2020    Electrolytes Lab Results  Component Value Date   NA 136 03/01/2020   K 4.3 03/01/2020   CL 100 03/01/2020   CALCIUM 8.9 03/01/2020   MG 2.3 02/15/2020    Bone Lab Results  Component Value Date   25OHVITD1 37 02/15/2020   25OHVITD2 <1.0 02/15/2020   25OHVITD3 37 02/15/2020    Inflammation (CRP: Acute Phase) (ESR: Chronic Phase) Lab Results  Component Value Date   CRP 9 02/15/2020   ESRSEDRATE 19 02/15/2020         Note: Above Lab results reviewed.  Recent Imaging Review  DG PAIN CLINIC C-ARM 1-60 MIN NO REPORT Fluoro was used, but no Radiologist interpretation will be provided.  Please refer to "NOTES" tab for provider progress note. Note: Reviewed        Physical Exam  General appearance: Well nourished, well developed, and well hydrated. In no apparent acute distress Mental status: Alert, oriented x 3 (person, place, & time)       Respiratory: No evidence of acute respiratory distress Eyes: PERLA Vitals: There were no  vitals taken for this visit. BMI: Estimated body mass index is 26.05 kg/m as  calculated from the following:   Height as of 02/06/22: 6' 4"$  (1.93 m).   Weight as of 02/06/22: 214 lb (97.1 kg). Ideal: Ideal body weight: 86.8 kg (191 lb 5.7 oz) Adjusted ideal body weight: 90.9 kg (200 lb 6.7 oz)  Assessment   Diagnosis Status  1. 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 Chronic lower extremity pain (2ry area of Pain) (Left)   3. Lumbosacral radiculopathy at L5 (Bilateral)   4. Lumbosacral radiculopathy at S1 (Bilateral)   5. Numbness and tingling of lower extremity (Left)   6. 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 DDD (degenerative disc disease), lumbosacral    Controlled Controlled Controlled   Updated Problems: No problems updated.  Plan of Care  Problem-specific:  No problem-specific Assessment & Plan notes found for this encounter.  Mr. Lazavion Fana has a current medication list which includes the following long-term medication(s): atorvastatin.  Pharmacotherapy (Medications Ordered): No orders of the defined types were placed in this encounter.  Orders:  No orders of the defined types were placed in this encounter.  Follow-up plan:   No follow-ups on file.      Interventional Therapies  Risk  Complexity Considerations:   Xarelto ANTICOAGULATION: (Stop: 3 days  Restart: 6 hours)   Planned  Pending:   Therapeutic left L5-S1 LESI #1    Under consideration:      Completed:   Diagnostic/therapeutic left L3 & L4 TESI x1 (09/26/2021) (100/100/100/100 x 2 months) Therapeutic left L5 TFESI x2 (04/25/2021) (100/100/100/90-100)  Therapeutic left L3-4 LESI x1 (02/16/2020) (90/90/65/65)  Therapeutic right L3-4 LESI x1 (04/25/2021) (100/100/100/90-100)    Therapeutic  Palliative (PRN) options:            Recent Visits Date Type Provider Dept  02/06/22 Procedure visit Milinda Pointer, MD [facility]   01/29/22 Office Visit Milinda Pointer, MD [facility]  Showing recent visits within past 90 days and meeting all other requirements Future Appointments Date Type Provider Dept  02/20/22 Appointment Milinda Pointer, MD [facility]  Showing future appointments within next 90 days and meeting all other requirements  I discussed the assessment and treatment plan with the patient. The patient was provided an opportunity to ask questions and all were answered. The patient agreed with the plan and demonstrated an understanding of the instructions.  Patient advised to call back or seek an in-person evaluation if the symptoms or condition worsens.  Duration of encounter: *** minutes.  Total time on encounter, as per AMA guidelines included both the face-to-face and non-face-to-face time personally spent by the physician and/or other qualified health care professional(s) on the day of the encounter (includes time in activities that require the physician or other qualified health care professional and does not include time in activities normally performed by clinical staff). Physician's time may include the following activities when performed: Preparing to see the patient (e.g., pre-charting review of records, searching for previously ordered imaging, lab work, and nerve conduction tests) Review of prior analgesic pharmacotherapies. Reviewing PMP Interpreting ordered tests (e.g., lab work, imaging, nerve conduction tests) Performing post-procedure evaluations, including interpretation of diagnostic procedures Obtaining and/or reviewing separately obtained history Performing a medically appropriate examination and/or evaluation Counseling and educating the patient/family/caregiver Ordering medications, tests, or procedures Referring and  communicating with other health care professionals (when not separately reported) Documenting clinical information in the electronic or other  health record Independently interpreting results (not separately reported) and communicating results to the patient/ family/caregiver Care coordination (not separately reported)  Note by: Gaspar Cola, MD Date: 02/20/2022; Time: 10:00 AM

## 2022-02-20 ENCOUNTER — Ambulatory Visit: Payer: Medicare Other | Attending: Pain Medicine | Admitting: Pain Medicine

## 2022-02-20 ENCOUNTER — Encounter: Payer: Self-pay | Admitting: Pain Medicine

## 2022-02-20 VITALS — BP 133/107 | HR 98 | Temp 98.4°F | Ht 76.0 in | Wt 214.0 lb

## 2022-02-20 DIAGNOSIS — M5417 Radiculopathy, lumbosacral region: Secondary | ICD-10-CM | POA: Diagnosis not present

## 2022-02-20 DIAGNOSIS — M48062 Spinal stenosis, lumbar region with neurogenic claudication: Secondary | ICD-10-CM

## 2022-02-20 DIAGNOSIS — G8929 Other chronic pain: Secondary | ICD-10-CM | POA: Diagnosis not present

## 2022-02-20 DIAGNOSIS — M5137 Other intervertebral disc degeneration, lumbosacral region: Secondary | ICD-10-CM | POA: Diagnosis present

## 2022-02-20 DIAGNOSIS — R937 Abnormal findings on diagnostic imaging of other parts of musculoskeletal system: Secondary | ICD-10-CM

## 2022-02-20 DIAGNOSIS — R200 Anesthesia of skin: Secondary | ICD-10-CM

## 2022-02-20 DIAGNOSIS — M79605 Pain in left leg: Secondary | ICD-10-CM | POA: Insufficient documentation

## 2022-02-20 DIAGNOSIS — M5442 Lumbago with sciatica, left side: Secondary | ICD-10-CM | POA: Diagnosis not present

## 2022-02-20 DIAGNOSIS — R202 Paresthesia of skin: Secondary | ICD-10-CM

## 2022-02-20 DIAGNOSIS — M48061 Spinal stenosis, lumbar region without neurogenic claudication: Secondary | ICD-10-CM | POA: Diagnosis present

## 2022-02-20 DIAGNOSIS — M51379 Other intervertebral disc degeneration, lumbosacral region without mention of lumbar back pain or lower extremity pain: Secondary | ICD-10-CM

## 2022-02-20 NOTE — Patient Instructions (Signed)
For a more information on how diet can influence inflammation, please visit: [email or web address] NOTE: We are not associated or profit from this web site.

## 2022-04-02 HISTORY — PX: PROSTATE SURGERY: SHX751

## 2022-08-15 ENCOUNTER — Ambulatory Visit: Payer: Medicare Other | Attending: Pain Medicine | Admitting: Pain Medicine

## 2022-08-15 DIAGNOSIS — M2428 Disorder of ligament, vertebrae: Secondary | ICD-10-CM | POA: Diagnosis not present

## 2022-08-15 DIAGNOSIS — M5137 Other intervertebral disc degeneration, lumbosacral region: Secondary | ICD-10-CM

## 2022-08-15 DIAGNOSIS — M48062 Spinal stenosis, lumbar region with neurogenic claudication: Secondary | ICD-10-CM

## 2022-08-15 DIAGNOSIS — M79605 Pain in left leg: Secondary | ICD-10-CM

## 2022-08-15 DIAGNOSIS — M4316 Spondylolisthesis, lumbar region: Secondary | ICD-10-CM

## 2022-08-15 DIAGNOSIS — M48061 Spinal stenosis, lumbar region without neurogenic claudication: Secondary | ICD-10-CM

## 2022-08-15 DIAGNOSIS — R937 Abnormal findings on diagnostic imaging of other parts of musculoskeletal system: Secondary | ICD-10-CM

## 2022-08-15 DIAGNOSIS — M5442 Lumbago with sciatica, left side: Secondary | ICD-10-CM

## 2022-08-15 DIAGNOSIS — R202 Paresthesia of skin: Secondary | ICD-10-CM

## 2022-08-15 DIAGNOSIS — R200 Anesthesia of skin: Secondary | ICD-10-CM

## 2022-08-15 DIAGNOSIS — G8929 Other chronic pain: Secondary | ICD-10-CM

## 2022-08-15 DIAGNOSIS — M5417 Radiculopathy, lumbosacral region: Secondary | ICD-10-CM

## 2022-08-15 DIAGNOSIS — Z7901 Long term (current) use of anticoagulants: Secondary | ICD-10-CM

## 2022-08-15 NOTE — Patient Instructions (Signed)
______________________________________________________________________  Procedure instructions  Do not eat or drink fluids (other than water) for 6 hours before your procedure  No water for 2 hours before your procedure  Take your blood pressure medicine with a sip of water  Arrive 30 minutes before your appointment  Carefully read the "Preparing for your procedure" detailed instructions  If you have questions call us at [phone]  _____________________________________________________________________    ______________________________________________________________________  Preparing for your procedure  Appointments: If you think you may not be able to keep your appointment, call 24-48 hours in advance to cancel. We need time to make it available to others.  During your procedure appointment there will be: No Prescription Refills. No disability issues to discussed. No medication changes or discussions.  Instructions: Food intake: Avoid eating anything solid for at least 8 hours prior to your procedure. Clear liquid intake: You may take clear liquids such as water up to 2 hours prior to your procedure. (No carbonated drinks. No soda.) Transportation: Unless otherwise stated by your physician, bring a driver. Morning Medicines: Except for blood thinners, take all of your other morning medications with a sip of water. Make sure to take your heart and blood pressure medicines. If your blood pressure's lower number is above 100, the case will be rescheduled. Blood thinners: Make sure to stop your blood thinners as instructed.  If you take a blood thinner, but were not instructed to stop it, call our office [phone] and ask to talk to a nurse. Not stopping a blood thinner prior to certain procedures could lead to serious complications. Diabetics on insulin: Notify the staff so that you can be scheduled 1st case in the morning. If your diabetes requires high dose insulin,  take only  of your normal insulin dose the morning of the procedure and notify the staff that you have done so. Preventing infections: Shower with an antibacterial soap the morning of your procedure.  Build-up your immune system: Take 1000 mg of Vitamin C with every meal (3 times a day) the day prior to your procedure. Antibiotics: Inform the nursing staff if you are taking any antibiotics or if you have any conditions that may require antibiotics prior to procedures. (Example: recent joint implants)   Pregnancy: If you are pregnant make sure to notify the nursing staff. Not doing so may result in injury to the fetus, including death.  Sickness: If you have a cold, fever, or any active infections, call and cancel or reschedule your procedure. Receiving steroids while having an infection may result in complications. Arrival: You must be in the facility at least 30 minutes prior to your scheduled procedure. Tardiness: Your scheduled time is also the cutoff time. If you do not arrive at least 15 minutes prior to your procedure, you will be rescheduled.  Children: Do not bring any children with you. Make arrangements to keep them home. Dress appropriately: There is always a possibility that your clothing may get soiled. Avoid long dresses. Valuables: Do not bring any jewelry or valuables.  Reasons to call and reschedule or cancel your procedure: (Following these recommendations will minimize the risk of a serious complication.) Surgeries: Avoid having procedures within 2 weeks of any surgery. (Avoid for 2 weeks before or after any surgery). Flu Shots: Avoid having procedures within 2 weeks of a flu shots or . (Avoid for 2 weeks before or after immunizations). Barium: Avoid having a procedure within 7-10 days after having had a radiological study involving the use of   radiological contrast. (Myelograms, Barium swallow or enema study). Heart attacks: Avoid any elective procedures or surgeries for the  initial 6 months after a "Myocardial Infarction" (Heart Attack). Blood thinners: It is imperative that you stop these medications before procedures. Let us know if you if you take any blood thinner.  Infection: Avoid procedures during or within two weeks of an infection (including chest colds or gastrointestinal problems). Symptoms associated with infections include: Localized redness, fever, chills, night sweats or profuse sweating, burning sensation when voiding, cough, congestion, stuffiness, runny nose, sore throat, diarrhea, nausea, vomiting, cold or Flu symptoms, recent or current infections. It is specially important if the infection is over the area that we intend to treat. Heart and lung problems: Symptoms that may suggest an active cardiopulmonary problem include: cough, chest pain, breathing difficulties or shortness of breath, dizziness, ankle swelling, uncontrolled high or unusually low blood pressure, and/or palpitations. If you are experiencing any of these symptoms, cancel your procedure and contact your primary care physician for an evaluation.  Remember:  Regular Business hours are:  Monday to Thursday 8:00 AM to 4:00 PM  Provider's Schedule: Francisco Naveira, MD:  Procedure days: Tuesday and Thursday 7:30 AM to 4:00 PM  Bilal Lateef, MD:  Procedure days: Monday and Wednesday 7:30 AM to 4:00 PM  ______________________________________________________________________    ____________________________________________________________________________________________  General Risks and Possible Complications  Patient Responsibilities: It is important that you read this as it is part of your informed consent. It is our duty to inform you of the risks and possible complications associated with treatments offered to you. It is your responsibility as a patient to read this and to ask questions about anything that is not clear or that you believe was not covered in this  document.  Patient's Rights: You have the right to refuse treatment. You also have the right to change your mind, even after initially having agreed to have the treatment done. However, under this last option, if you wait until the last second to change your mind, you may be charged for the materials used up to that point.  Introduction: Medicine is not an exact science. Everything in Medicine, including the lack of treatment(s), carries the potential for danger, harm, or loss (which is by definition: Risk). In Medicine, a complication is a secondary problem, condition, or disease that can aggravate an already existing one. All treatments carry the risk of possible complications. The fact that a side effects or complications occurs, does not imply that the treatment was conducted incorrectly. It must be clearly understood that these can happen even when everything is done following the highest safety standards.  No treatment: You can choose not to proceed with the proposed treatment alternative. The "PRO(s)" would include: avoiding the risk of complications associated with the therapy. The "CON(s)" would include: not getting any of the treatment benefits. These benefits fall under one of three categories: diagnostic; therapeutic; and/or palliative. Diagnostic benefits include: getting information which can ultimately lead to improvement of the disease or symptom(s). Therapeutic benefits are those associated with the successful treatment of the disease. Finally, palliative benefits are those related to the decrease of the primary symptoms, without necessarily curing the condition (example: decreasing the pain from a flare-up of a chronic condition, such as incurable terminal cancer).  General Risks and Complications: These are associated to most interventional treatments. They can occur alone, or in combination. They fall under one of the following six (6) categories: no benefit or worsening of symptoms;    bleeding; infection; nerve damage; allergic reactions; and/or death. No benefits or worsening of symptoms: In Medicine there are no guarantees, only probabilities. No healthcare provider can ever guarantee that a medical treatment will work, they can only state the probability that it may. Furthermore, there is always the possibility that the condition may worsen, either directly, or indirectly, as a consequence of the treatment. Bleeding: This is more common if the patient is taking a blood thinner, either prescription or over the counter (example: Goody Powders, Fish oil, Aspirin, Garlic, etc.), or if suffering a condition associated with impaired coagulation (example: Hemophilia, cirrhosis of the liver, low platelet counts, etc.). However, even if you do not have one on these, it can still happen. If you have any of these conditions, or take one of these drugs, make sure to notify your treating physician. Infection: This is more common in patients with a compromised immune system, either due to disease (example: diabetes, cancer, human immunodeficiency virus [HIV], etc.), or due to medications or treatments (example: therapies used to treat cancer and rheumatological diseases). However, even if you do not have one on these, it can still happen. If you have any of these conditions, or take one of these drugs, make sure to notify your treating physician. Nerve Damage: This is more common when the treatment is an invasive one, but it can also happen with the use of medications, such as those used in the treatment of cancer. The damage can occur to small secondary nerves, or to large primary ones, such as those in the spinal cord and brain. This damage may be temporary or permanent and it may lead to impairments that can range from temporary numbness to permanent paralysis and/or brain death. Allergic Reactions: Any time a substance or material comes in contact with our body, there is the possibility of an  allergic reaction. These can range from a mild skin rash (contact dermatitis) to a severe systemic reaction (anaphylactic reaction), which can result in death. Death: In general, any medical intervention can result in death, most of the time due to an unforeseen complication. ____________________________________________________________________________________________    ____________________________________________________________________________________________  Blood Thinners  IMPORTANT NOTICE:  If you take any of these, make sure to notify the nursing staff.  Failure to do so may result in injury.  Recommended time intervals to stop and restart blood-thinners, before & after invasive procedures  Generic Name Brand Name Pre-procedure. Stop this long before procedure. Post-procedure. Minimum waiting period before restarting.  Abciximab Reopro 15 days 2 hrs  Alteplase Activase 10 days 10 days  Anagrelide Agrylin    Apixaban Eliquis 3 days 6 hrs  Cilostazol Pletal 3 days 5 hrs  Clopidogrel Plavix 7-10 days 2 hrs  Dabigatran Pradaxa 5 days 6 hrs  Dalteparin Fragmin 24 hours 4 hrs  Dipyridamole Aggrenox 11days 2 hrs  Edoxaban Lixiana; Savaysa 3 days 2 hrs  Enoxaparin  Lovenox 24 hours 4 hrs  Eptifibatide Integrillin 8 hours 2 hrs  Fondaparinux  Arixtra 72 hours 12 hrs  Hydroxychloroquine Plaquenil 11 days   Prasugrel Effient 7-10 days 6 hrs  Reteplase Retavase 10 days 10 days  Rivaroxaban Xarelto 3 days 6 hrs  Ticagrelor Brilinta 5-7 days 6 hrs  Ticlopidine Ticlid 10-14 days 2 hrs  Tinzaparin Innohep 24 hours 4 hrs  Tirofiban Aggrastat 8 hours 2 hrs  Warfarin Coumadin 5 days 2 hrs   Other medications with blood-thinning effects  Product indications Generic (Brand) names Note  Cholesterol Lipitor Stop 4 days before procedure    Blood thinner (injectable) Heparin (LMW or LMWH Heparin) Stop 24 hours before procedure  Cancer Ibrutinib (Imbruvica) Stop 7 days before procedure   Malaria/Rheumatoid Hydroxychloroquine (Plaquenil) Stop 11 days before procedure  Thrombolytics  10 days before or after procedures   Over-the-counter (OTC) Products with blood-thinning effects  Product Common names Stop Time  Aspirin > 325 mg Goody Powders, Excedrin, etc. 11 days  Aspirin ? 81 mg  7 days  Fish oil  4 days  Garlic supplements  7 days  Ginkgo biloba  36 hours  Ginseng  24 hours  NSAIDs Ibuprofen, Naprosyn, etc. 3 days  Vitamin E  4 days   ____________________________________________________________________________________________    

## 2022-08-15 NOTE — Progress Notes (Signed)
Patient: Todd Bradford  Service Category: E/M  Provider: Oswaldo Done, MD  DOB: 1950-01-03  DOS: 08/15/2022  Location: Office  MRN: [id number]  Setting: Ambulatory outpatient  Referring Provider: Rueben Bash, NP  Type: Established Patient  Specialty: Interventional Pain Management  PCP: Rueben Bash, NP  Location: Remote location  Delivery: TeleHealth     Virtual Encounter - Pain Management PROVIDER NOTE: Information contained herein reflects review and annotations entered in association with encounter. Interpretation of such information and data should be left to medically-trained personnel. Information provided to patient can be located elsewhere in the medical record under "Patient Instructions". Document created using STT-dictation technology, any transcriptional errors that may result from process are unintentional.    Contact & Pharmacy Preferred: [phone] Home: [phone] (home) Mobile: [phone] (mobile) E-mail: [email or web address]  [facility] - [place], [place] - [address] [place] [address] Phone: [phone] Fax: [phone]  [id number] - [address], [place] - [address] AT [facility] [address] [place] [address] Phone: [phone] Fax: [phone]   Pre-screening  Mr. Henschen offered "in-person" vs "virtual" encounter. He indicated preferring virtual for this encounter.   Reason COVID-19*  Social distancing based on CDC and AMA recommendations.   I contacted Darius Bump on 08/15/2022 via telephone.      I clearly identified myself as Oswaldo Done, MD. I verified that I was speaking with the correct person using two identifiers (Name: Zigmunt Caughell, and date of birth: Aug 28, 1950).  Consent I sought verbal advanced consent from Darius Bump for virtual visit interactions. I informed Mr. Dasari of possible security and privacy concerns, risks, and limitations associated with providing  "not-in-person" medical evaluation and management services. I also informed Mr. Wittbrodt of the availability of "in-person" appointments. Finally, I informed him that there would be a charge for the virtual visit and that he could be  personally, fully or partially, financially responsible for it. Mr. Pawelski expressed understanding and agreed to proceed.   Historic Elements   Mr. Kensen Tierrablanca is a 72 y.o. year old, male patient evaluated today after our last contact on 02/20/2022. Mr. Marinacci  has a past medical history of Hyperlipidemia. He also  has a past surgical history that includes Neck surgery (2000 approx) and Spine surgery. Mr. Ming has a current medication list which includes the following prescription(s): vitamin c, aspirin ec, finasteride, tamsulosin, and tramadol. He  reports that he quit smoking about 17 years ago. His smoking use included cigarettes. He started smoking about 47 years ago. He has a 60 pack-year smoking history. He has never used smokeless tobacco. No history on file for alcohol use and drug use. Mr. Difranco has No Known Allergies.  BMI: Estimated body mass index is 26.05 kg/m as calculated from the following:   Height as of 02/20/22: 6\' 4"  (1.93 m).   Weight as of 02/20/22: 214 lb (97.1 kg). Last encounter: 02/20/2022. Last procedure: 02/06/2022.  HPI  Today, he is being contacted for worsening of previously known (established) problem.  The patient indicates that his back pain is coming back and he is beginning to experience bilateral feet numbness.  He would like to have the epidural steroid injection repeated.  He states that he is not on the Xarelto, for the time being.  Apparently he had a TURP and was having some postoperative bleeding.  He was instructed to stop that Xarelto and has not gone back  on it.  He is on 81 mg ASA daily, which I have instructed him not to stop for the procedure.  He understood and accepted.  Pharmacotherapy Assessment   Opioid Analgesic: No  chronic opioid analgesics therapy prescribed by our practice. Tramadol 50 mg, half a tablet p.o. daily MME/day: 2.5 mg/day   Monitoring: [place] PMP: PDMP reviewed during this encounter.       Pharmacotherapy: No side-effects or adverse reactions reported. Compliance: No problems identified. Effectiveness: Clinically acceptable. Plan: Refer to "POC". UDS:  Summary  Date Value Ref Range Status  02/16/2020 FINAL  Final    Comment:    ==================================================================== TOXASSURE COMP DRUG ANALYSIS,UR ==================================================================== Test                             Result       Flag       Units Drug Present and Declared for Prescription Verification   Tramadol                       5215         EXPECTED   ng/mg creat   O-Desmethyltramadol            6226         EXPECTED   ng/mg creat   N-Desmethyltramadol            1269         EXPECTED   ng/mg creat    Source of tramadol is a prescription medication.    O-desmethyltramadol and N-desmethyltramadol are expected    metabolites of tramadol.    Cyclobenzaprine                PRESENT      EXPECTED   Desmethylcyclobenzaprine       PRESENT      EXPECTED    Desmethylcyclobenzaprine is an expected metabolite of    cyclobenzaprine.  Drug Present not Declared for Prescription Verification   Ephedrine/Pseudoephedrine      PRESENT      UNEXPECTED   Phenylpropanolamine            PRESENT      UNEXPECTED    Source of ephedrine/pseudoephedrine is most commonly    pseudoephedrine in over-the-counter or prescription cold and    allergy medications. Phenylpropanolamine is an expected    metabolite of ephedrine/pseudoephedrine.    Orphenadrine                   PRESENT      UNEXPECTED   Ibuprofen                      PRESENT      UNEXPECTED   Diphenhydramine                PRESENT      UNEXPECTED ==================================================================== Test                       Result    Flag   Units      Ref Range   Creatinine              74               mg/dL      >=25 ==================================================================== Declared Medications:  The flagging and interpretation on this report are based on the  following declared medications.  Unexpected results may arise from  inaccuracies in the declared medications.   **Note: The testing scope of this panel includes these medications:   Cyclobenzaprine (Flexeril)  Tramadol (Ultram)   **Note: The testing scope of this panel does not include following  reported medications:   Atorvastatin (Lipitor)  Finasteride (Proscar)  Tamsulosin (Flomax) ==================================================================== For clinical consultation, please call [phone]. ====================================================================    No results found for: "CBDTHCR", "D8THCCBX", "D9THCCBX"   Laboratory Chemistry Profile   Renal Lab Results  Component Value Date   BUN 26 (H) 03/01/2020   CREATININE 1.24 03/01/2020   BCR 18 02/15/2020   GFRAA 64 02/15/2020   GFRNONAA >60 03/01/2020    Hepatic Lab Results  Component Value Date   AST 45 (H) 03/01/2020   ALT 77 (H) 03/01/2020   ALBUMIN 3.6 03/01/2020   ALKPHOS 161 (H) 03/01/2020    Electrolytes Lab Results  Component Value Date   NA 136 03/01/2020   K 4.3 03/01/2020   CL 100 03/01/2020   CALCIUM 8.9 03/01/2020   MG 2.3 02/15/2020    Bone Lab Results  Component Value Date   25OHVITD1 37 02/15/2020   25OHVITD2 <1.0 02/15/2020   25OHVITD3 37 02/15/2020    Inflammation (CRP: Acute Phase) (ESR: Chronic Phase) Lab Results  Component Value Date   CRP 9 02/15/2020   ESRSEDRATE 19 02/15/2020         Note: Above Lab results reviewed.  Imaging  DG PAIN CLINIC C-ARM 1-60 MIN NO REPORT Fluoro was used, but no Radiologist interpretation will be provided.  Please refer to "NOTES" tab for provider progress  note.  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  Plan of Care  Problem-specific:  No problem-specific Assessment & Plan notes found for this encounter.  Mr. Adoni Heinig is not on any long-term medications.  Pharmacotherapy (Medications Ordered): No orders of the defined types were placed in this encounter.  Orders:  Orders Placed This Encounter  Procedures   Lumbar Epidural Injection    Standing Status:   Future    Standing Expiration Date:   11/15/2022    Scheduling Instructions:     Procedure: Interlaminar Lumbar Epidural Steroid injection (LESI)  L5-S1     Laterality: Left-sided     Sedation: Patient's choice.     Timeframe: ASAA    Order Specific Question:   Where will this procedure be performed?    Answer:   [facility]   Blood Thinner Instructions to Nursing    Always make sure patient has clearance from prescribing physician to stop blood thinners for interventional therapies. If the patient requires a Lovenox-bridge therapy, make sure arrangements are made to institute it with the assistance of the PCP.    Scheduling Instructions:     Have Mr. Yaccarino stop the Xarelto (Rivaroxaban) x 3 days prior to procedure or surgery.   Follow-up plan:   Return for [facility]): (L) L5-S1 LESI #2, (Blood Thinner Protocol).  Interventional Therapies  Risk  Complexity Considerations:   Xarelto ANTICOAGULATION: (Stop: 3 days  Restart: 6 hours)   Planned  Pending:   Therapeutic left L5-S1 LESI #2    Under consideration:   Diagnostic bilateral (L2-S1) lumbar facet MBB #1  Therapeutic midline L2-3 LESI #1  Therapeutic L2-3, L3-4, and L4-5 MILD procedure   Completed:   Diagnostic/therapeutic left L3 & L4 TESI x1 (09/26/2021) (100/100/100/100 x 2 months) Therapeutic left L5 TFESI x2 (04/25/2021) (100/100/100/90-100)  Therapeutic left L5-S1 LESI x1 (02/06/2022) (100/100/70/ LBP:70  LLEP:100)  Therapeutic left L3-4 LESI x1 (02/16/2020) (90/90/65/65)  Therapeutic right L3-4 LESI x1 (04/25/2021) (100/100/100/90-100)    Therapeutic  Palliative (PRN) options:         Recent Visits No visits were found meeting these conditions. Showing recent visits within past 90 days and meeting all other requirements Today's Visits Date Type Provider Dept  08/15/22 Office Visit Delano Metz, MD [facility]  Showing today's visits and meeting all other requirements Future Appointments No visits were found meeting these conditions. Showing future appointments within next 90 days and meeting all other requirements  I discussed the assessment and treatment plan with the patient. The patient was provided an opportunity to ask questions and all were answered. The patient agreed with the plan and demonstrated an understanding of the instructions.  Patient advised to call back or seek an in-person evaluation if the symptoms or condition worsens.  Duration of encounter: 16 minutes.  Note by: Oswaldo Done, MD Date: 08/15/2022; Time: 2:06 PM

## 2022-08-16 NOTE — Progress Notes (Signed)
08/16/2022 Called patient to go over blood thinner instructions for LESI. No answer. LVM for him to return call for instructions.

## 2022-08-30 ENCOUNTER — Ambulatory Visit
Admission: RE | Admit: 2022-08-30 | Discharge: 2022-08-30 | Disposition: A | Discharge status: Home / Self Care | Payer: Medicare Other | Source: Ambulatory Visit | Attending: Pain Medicine | Admitting: Pain Medicine

## 2022-08-30 ENCOUNTER — Encounter: Payer: Self-pay | Admitting: Pain Medicine

## 2022-08-30 ENCOUNTER — Ambulatory Visit: Payer: Medicare Other | Attending: Pain Medicine | Admitting: Pain Medicine

## 2022-08-30 VITALS — BP 141/85 | HR 106 | Temp 97.6°F | Resp 16 | Ht 76.0 in | Wt 213.0 lb

## 2022-08-30 DIAGNOSIS — M5442 Lumbago with sciatica, left side: Secondary | ICD-10-CM

## 2022-08-30 DIAGNOSIS — Z7901 Long term (current) use of anticoagulants: Secondary | ICD-10-CM

## 2022-08-30 DIAGNOSIS — M51379 Other intervertebral disc degeneration, lumbosacral region without mention of lumbar back pain or lower extremity pain: Secondary | ICD-10-CM

## 2022-08-30 DIAGNOSIS — R200 Anesthesia of skin: Secondary | ICD-10-CM | POA: Diagnosis present

## 2022-08-30 DIAGNOSIS — M5417 Radiculopathy, lumbosacral region: Secondary | ICD-10-CM | POA: Diagnosis present

## 2022-08-30 DIAGNOSIS — M48061 Spinal stenosis, lumbar region without neurogenic claudication: Secondary | ICD-10-CM | POA: Diagnosis present

## 2022-08-30 DIAGNOSIS — M2428 Disorder of ligament, vertebrae: Secondary | ICD-10-CM | POA: Diagnosis present

## 2022-08-30 DIAGNOSIS — G8929 Other chronic pain: Secondary | ICD-10-CM | POA: Diagnosis present

## 2022-08-30 DIAGNOSIS — M48062 Spinal stenosis, lumbar region with neurogenic claudication: Secondary | ICD-10-CM

## 2022-08-30 DIAGNOSIS — M5137 Other intervertebral disc degeneration, lumbosacral region: Secondary | ICD-10-CM | POA: Insufficient documentation

## 2022-08-30 DIAGNOSIS — M79605 Pain in left leg: Secondary | ICD-10-CM | POA: Diagnosis present

## 2022-08-30 DIAGNOSIS — R202 Paresthesia of skin: Secondary | ICD-10-CM | POA: Diagnosis present

## 2022-08-30 DIAGNOSIS — M4316 Spondylolisthesis, lumbar region: Secondary | ICD-10-CM | POA: Diagnosis present

## 2022-08-30 MED ORDER — SODIUM CHLORIDE (PF) 0.9 % IJ SOLN
INTRAMUSCULAR | Status: AC
  Filled 2022-08-30: qty 10

## 2022-08-30 MED ORDER — ROPIVACAINE HCL 2 MG/ML IJ SOLN
2.0000 mL | Freq: Once | INTRAMUSCULAR | Status: AC
  Administered 2022-08-30: 2 mL via EPIDURAL
  Filled 2022-08-30: qty 20

## 2022-08-30 MED ORDER — SODIUM CHLORIDE 0.9% FLUSH
2.0000 mL | Freq: Once | INTRAVENOUS | Status: AC
  Administered 2022-08-30: 2 mL

## 2022-08-30 MED ORDER — LIDOCAINE HCL 2 % IJ SOLN
20.0000 mL | Freq: Once | INTRAMUSCULAR | Status: AC
  Administered 2022-08-30: 400 mg
  Filled 2022-08-30: qty 20

## 2022-08-30 MED ORDER — IOHEXOL 180 MG/ML  SOLN
10.0000 mL | Freq: Once | INTRAMUSCULAR | Status: AC
  Administered 2022-08-30: 10 mL via EPIDURAL
  Filled 2022-08-30: qty 20

## 2022-08-30 MED ORDER — MIDAZOLAM HCL 2 MG/2ML IJ SOLN: 0.5000 mg | Freq: Once | INTRAMUSCULAR | Status: DC

## 2022-08-30 MED ORDER — LACTATED RINGERS IV SOLN: Freq: Once | INTRAVENOUS | Status: DC

## 2022-08-30 MED ORDER — TRIAMCINOLONE ACETONIDE 40 MG/ML IJ SUSP
40.0000 mg | Freq: Once | INTRAMUSCULAR | Status: DC
  Filled 2022-08-30: qty 1

## 2022-08-30 MED ORDER — PENTAFLUOROPROP-TETRAFLUOROETH EX AERO
INHALATION_SPRAY | Freq: Once | CUTANEOUS | Status: AC
  Administered 2022-08-30: 30 via TOPICAL
  Filled 2022-08-30: qty 30

## 2022-08-30 NOTE — Patient Instructions (Signed)

## 2022-08-30 NOTE — Progress Notes (Signed)
Safety precautions to be maintained throughout the outpatient stay will include: orient to surroundings, keep bed in low position, maintain call bell within reach at all times, provide assistance with transfer out of bed and ambulation.  

## 2022-08-30 NOTE — Progress Notes (Signed)
PROVIDER NOTE: Interpretation of information contained herein should be left to medically-trained personnel. Specific patient instructions are provided elsewhere under "Patient Instructions" section of medical record. This document was created in part using STT-dictation technology, any transcriptional errors that may result from this process are unintentional.  Patient: Todd Bradford Type: Established DOB: 04/17/1950 MRN: [id number] PCP: Rueben Bash, NP  Service: Procedure DOS: 08/30/2022 Setting: Ambulatory Location: Ambulatory outpatient facility Delivery: Face-to-face Provider: Oswaldo Done, MD Specialty: Interventional Pain Management Specialty designation: 09 Location: Outpatient facility Ref. Prov.: Delano Metz, MD       Interventional Therapy   Procedure: Lumbar epidural steroid injection (LESI) (interlaminar) #2    Laterality: Left   Level:  L5-S1 Level.  Imaging: Fluoroscopic guidance Spinal ([id number]) Anesthesia: Local anesthesia (1-2% Lidocaine) Anxiolysis: None  the patient did not keep his n.p.o. order.          Sedation: No Sedation                       DOS: 08/30/2022  Performed by: Oswaldo Done, MD  Purpose: Diagnostic/Therapeutic Indications: Lumbar radicular pain of intraspinal etiology of more than 4 weeks that has failed to respond to conservative therapy and is severe enough to impact quality of life or function. 1. 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 Chronic lower extremity pain (2ry area of Pain) (Left)   3. DDD (degenerative disc disease), lumbosacral   4. Anterolisthesis of lumbar spine L4/L5 (3mm)   5. Lumbosacral radiculopathy at L5 (Bilateral)   6. Lumbosacral radiculopathy at S1 (Bilateral)   7. 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 Numbness and tingling of lower extremity (Left)   10. Chronic  anticoagulation (Xarelto)    NAS-11 Pain score:   Pre-procedure: 6 /10   Post-procedure: 0-No pain/10      Position / Prep / Materials:  Position: Prone w/ head of the table raised (slight reverse trendelenburg) to facilitate breathing.  Prep solution: DuraPrep (Iodine Povacrylex [0.7% available iodine] and Isopropyl Alcohol, 74% w/w) Prep Area: Entire Posterior Lumbar Region from lower scapular tip down to mid buttocks area and from flank to flank. Materials:  Tray: Epidural tray Needle(s):  Type: Epidural needle (Tuohy) Gauge (G):  17 Length: Regular (3.5-in) Qty: 1   H&P (Pre-op Assessment):  Mr. Macgillivray is a 72 y.o. (year old), male patient, seen today for interventional treatment. He  has a past surgical history that includes Neck surgery (2000 approx); Spine surgery; and Prostate surgery (04/2022). Mr. Aldred has a current medication list which includes the following prescription(s): vitamin c, aspirin ec, cyclobenzaprine, finasteride, tamsulosin, and tramadol, and the following Facility-Administered Medications: triamcinolone acetonide. His primarily concern today is the Back Pain (lower)  Initial Vital Signs:  Pulse/HCG Rate: (!) 108  Temp: 97.6 F (36.4 C) Resp: 16 BP: 137/81 SpO2: 100 %  BMI: Estimated body mass index is 25.93 kg/m as calculated from the following:   Height as of this encounter: 6\' 4"  (1.93 m).   Weight as of this encounter: 213 lb (96.6 kg).  Risk Assessment: Allergies: Reviewed. He has No Known Allergies.  Allergy Precautions: None required Coagulopathies: Reviewed. None identified.  Blood-thinner therapy: None at this time Active Infection(s): Reviewed. None identified. Mr. Klaus is afebrile  Site Confirmation: Mr. Douyon was asked to confirm the procedure and laterality before marking the  site Procedure checklist: Completed Consent: Before the procedure and under the influence of no sedative(s), amnesic(s), or anxiolytics, the patient was  informed of the treatment options, risks and possible complications. To fulfill our ethical and legal obligations, as recommended by the American Medical Association's Code of Ethics, I have informed the patient of my clinical impression; the nature and purpose of the treatment or procedure; the risks, benefits, and possible complications of the intervention; the alternatives, including doing nothing; the risk(s) and benefit(s) of the alternative treatment(s) or procedure(s); and the risk(s) and benefit(s) of doing nothing. The patient was provided information about the general risks and possible complications associated with the procedure. These may include, but are not limited to: failure to achieve desired goals, infection, bleeding, organ or nerve damage, allergic reactions, paralysis, and death. In addition, the patient was informed of those risks and complications associated to Spine-related procedures, such as failure to decrease pain; infection (i.e.: Meningitis, epidural or intraspinal abscess); bleeding (i.e.: epidural hematoma, subarachnoid hemorrhage, or any other type of intraspinal or peri-dural bleeding); organ or nerve damage (i.e.: Any type of peripheral nerve, nerve root, or spinal cord injury) with subsequent damage to sensory, motor, and/or autonomic systems, resulting in permanent pain, numbness, and/or weakness of one or several areas of the body; allergic reactions; (i.e.: anaphylactic reaction); and/or death. Furthermore, the patient was informed of those risks and complications associated with the medications. These include, but are not limited to: allergic reactions (i.e.: anaphylactic or anaphylactoid reaction(s)); adrenal axis suppression; blood sugar elevation that in diabetics may result in ketoacidosis or comma; water retention that in patients with history of congestive heart failure may result in shortness of breath, pulmonary edema, and decompensation with resultant heart  failure; weight gain; swelling or edema; medication-induced neural toxicity; particulate matter embolism and blood vessel occlusion with resultant organ, and/or nervous system infarction; and/or aseptic necrosis of one or more joints. Finally, the patient was informed that Medicine is not an exact science; therefore, there is also the possibility of unforeseen or unpredictable risks and/or possible complications that may result in a catastrophic outcome. The patient indicated having understood very clearly. We have given the patient no guarantees and we have made no promises. Enough time was given to the patient to ask questions, all of which were answered to the patient's satisfaction. Mr. Sloane has indicated that he wanted to continue with the procedure. Attestation: I, the ordering provider, attest that I have discussed with the patient the benefits, risks, side-effects, alternatives, likelihood of achieving goals, and potential problems during recovery for the procedure that I have provided informed consent. Date  Time: 08/30/2022 10:47 AM   Pre-Procedure Preparation:  Monitoring: As per clinic protocol. Respiration, ETCO2, SpO2, BP, heart rate and rhythm monitor placed and checked for adequate function Safety Precautions: Patient was assessed for positional comfort and pressure points before starting the procedure. Time-out: I initiated and conducted the "Time-out" before starting the procedure, as per protocol. The patient was asked to participate by confirming the accuracy of the "Time Out" information. Verification of the correct person, site, and procedure were performed and confirmed by me, the nursing staff, and the patient. "Time-out" conducted as per Joint Commission's Universal Protocol (UP.01.01.01). Time: 1124 Start Time: 1124 hrs.  Description/Narrative of Procedure:          Target: Epidural space via interlaminar opening, initially targeting the lower laminar border of the superior  vertebral body. Region: Lumbar Approach: Percutaneous paravertebral  Rationale (medical necessity): procedure needed and  proper for the diagnosis and/or treatment of the patient's medical symptoms and needs. Procedural Technique Safety Precautions: Aspiration looking for blood return was conducted prior to all injections. At no point did we inject any substances, as a needle was being advanced. No attempts were made at seeking any paresthesias. Safe injection practices and needle disposal techniques used. Medications properly checked for expiration dates. SDV (single dose vial) medications used. Description of the Procedure: Protocol guidelines were followed. The procedure needle was introduced through the skin, ipsilateral to the reported pain, and advanced to the target area. Bone was contacted and the needle walked caudad, until the lamina was cleared. The epidural space was identified using "loss-of-resistance technique" with 2-3 ml of PF-NaCl (0.9% NSS), in a 5cc LOR glass syringe.  Vitals:   08/30/22 1046 08/30/22 1119 08/30/22 1129  BP: 137/81 (!) 158/93 (!) 141/85  Pulse: (!) 108 (!) 107 (!) 106  Resp:  16 16  Temp: 97.6 F (36.4 C)    TempSrc: Temporal    SpO2: 100% 99% 99%  Weight: 213 lb (96.6 kg)    Height: 6\' 4"  (1.93 m)      Start Time: 1124 hrs. End Time: 1129 hrs.  Imaging Guidance (Spinal):          Type of Imaging Technique: Fluoroscopy Guidance (Spinal) Indication(s): Assistance in needle guidance and placement for procedures requiring needle placement in or near specific anatomical locations not easily accessible without such assistance. Exposure Time: Please see nurses notes. Contrast: Before injecting any contrast, we confirmed that the patient did not have an allergy to iodine, shellfish, or radiological contrast. Once satisfactory needle placement was completed at the desired level, radiological contrast was injected. Contrast injected under live fluoroscopy. No  contrast complications. See chart for type and volume of contrast used. Fluoroscopic Guidance: I was personally present during the use of fluoroscopy. "Tunnel Vision Technique" used to obtain the best possible view of the target area. Parallax error corrected before commencing the procedure. "Direction-depth-direction" technique used to introduce the needle under continuous pulsed fluoroscopy. Once target was reached, antero-posterior, oblique, and lateral fluoroscopic projection used confirm needle placement in all planes. Images permanently stored in EMR. Interpretation: I personally interpreted the imaging intraoperatively. Adequate needle placement confirmed in multiple planes. Appropriate spread of contrast into desired area was observed. No evidence of afferent or efferent intravascular uptake. No intrathecal or subarachnoid spread observed. Permanent images saved into the patient's record.  Antibiotic Prophylaxis:   Anti-infectives (From admission, onward)    None      Indication(s): None identified  Post-operative Assessment:  Post-procedure Vital Signs:  Pulse/HCG Rate: (!) 106  Temp: 97.6 F (36.4 C) Resp: 16 BP: (!) 141/85 SpO2: 99 %  EBL: None  Complications: No immediate post-treatment complications observed by team, or reported by patient.  Note: The patient tolerated the entire procedure well. A repeat set of vitals were taken after the procedure and the patient was kept under observation following institutional policy, for this type of procedure. Post-procedural neurological assessment was performed, showing return to baseline, prior to discharge. The patient was provided with post-procedure discharge instructions, including a section on how to identify potential problems. Should any problems arise concerning this procedure, the patient was given instructions to immediately contact us, at any time, without hesitation. In any case, we plan to contact the patient by telephone  for a follow-up status report regarding this interventional procedure.  Comments:  No additional relevant information.  Plan of Care (POC)  Orders:  Orders Placed This Encounter  Procedures   Lumbar Epidural Injection    Scheduling Instructions:     Procedure: Interlaminar LESI L5-S1     Laterality: Left     Sedation: Patient's choice     Timeframe: Today    Order Specific Question:   Where will this procedure be performed?    Answer:   [facility]   DG [facility] C-ARM 1-60 MIN NO REPORT    Intraoperative interpretation by procedural physician at [facility].    Standing Status:   Standing    Number of Occurrences:   1    Order Specific Question:   Reason for exam:    Answer:   Assistance in needle guidance and placement for procedures requiring needle placement in or near specific anatomical locations not easily accessible without such assistance.   Informed Consent Details: Physician/Practitioner Attestation; Transcribe to consent form and obtain patient signature    Note: Always confirm laterality of pain with Mr. Balash, before procedure. Transcribe to consent form and obtain patient signature.    Order Specific Question:   Physician/Practitioner attestation of informed consent for procedure/surgical case    Answer:   I, the physician/practitioner, attest that I have discussed with the patient the benefits, risks, side effects, alternatives, likelihood of achieving goals and potential problems during recovery for the procedure that I have provided informed consent.    Order Specific Question:   Procedure    Answer:   Lumbar epidural steroid injection under fluoroscopic guidance    Order Specific Question:   Physician/Practitioner performing the procedure    Answer:   Laquana Villari A. Laban Emperor, MD    Order Specific Question:   Indication/Reason    Answer:   Low back and/or lower extremity pain secondary to lumbar radiculitis   Care order/instruction: Please  confirm that the patient has stopped the Xarelto (Rivaroxaban) x 3 days prior to procedure or surgery.    Please confirm that the patient has stopped the Xarelto (Rivaroxaban) x 3 days prior to procedure or surgery.    Standing Status:   Standing    Number of Occurrences:   1   Provide equipment / supplies at bedside    Procedural tray: Epidural Tray (Disposable  single use) Skin infiltration needle: Regular 1.5-in, 25-G, (x1) Block needle size: Regular standard Catheter: No catheter required    Standing Status:   Standing    Number of Occurrences:   1    Order Specific Question:   Specify    Answer:   Epidural Tray   Bleeding precautions    Standing Status:   Standing    Number of Occurrences:   1   Chronic Opioid Analgesic:  No chronic opioid analgesics therapy prescribed by our practice. Tramadol 50 mg, half a tablet p.o. daily MME/day: 2.5 mg/day   Medications ordered for procedure: Meds ordered this encounter  Medications   iohexol (OMNIPAQUE) 180 MG/ML injection 10 mL    Must be Myelogram-compatible. If not available, you may substitute with a water-soluble, non-ionic, hypoallergenic, myelogram-compatible radiological contrast medium.   lidocaine (XYLOCAINE) 2 % (with pres) injection 400 mg   pentafluoroprop-tetrafluoroeth (GEBAUERS) aerosol   DISCONTD: lactated ringers infusion   DISCONTD: midazolam (VERSED) injection 0.5-2 mg    Make sure Flumazenil is available in the pyxis when using this medication. If oversedation occurs, administer 0.2 mg IV over 15 sec. If after 45 sec no response, administer 0.2 mg again over 1 min; may repeat at 1 min  intervals; not to exceed 4 doses (1 mg)   sodium chloride flush (NS) 0.9 % injection 2 mL   ropivacaine (PF) 2 mg/mL (0.2%) (NAROPIN) injection 2 mL   triamcinolone acetonide (KENALOG-40) injection 40 mg   Medications administered: We administered iohexol, lidocaine, pentafluoroprop-tetrafluoroeth, sodium chloride flush, and  ropivacaine (PF) 2 mg/mL (0.2%).  See the medical record for exact dosing, route, and time of administration.  Follow-up plan:   Return in about 2 weeks (around 09/13/2022) for (VV), (PPE).       Interventional Therapies  Risk  Complexity Considerations:   Xarelto ANTICOAGULATION: (Stop: 3 days  Restart: 6 hours)   Planned  Pending:   Therapeutic left L5-S1 LESI #2    Under consideration:   Diagnostic bilateral (L2-S1) lumbar facet MBB #1  Therapeutic midline L2-3 LESI #1  Therapeutic L2-3, L3-4, and L4-5 MILD procedure   Completed:   Diagnostic/therapeutic left L3 & L4 TESI x1 (09/26/2021) (100/100/100/100 x 2 months) Therapeutic left L5 TFESI x2 (04/25/2021) (100/100/100/90-100)  Therapeutic left L5-S1 LESI x1 (02/06/2022) (100/100/70/ LBP:70  LLEP:100)  Therapeutic left L3-4 LESI x1 (02/16/2020) (90/90/65/65)  Therapeutic right L3-4 LESI x1 (04/25/2021) (100/100/100/90-100)    Therapeutic  Palliative (PRN) options:         Recent Visits Date Type Provider Dept  08/15/22 Office Visit Delano Metz, MD [facility]  Showing recent visits within past 90 days and meeting all other requirements Today's Visits Date Type Provider Dept  08/30/22 Procedure visit Delano Metz, MD [facility]  Showing today's visits and meeting all other requirements Future Appointments No visits were found meeting these conditions. Showing future appointments within next 90 days and meeting all other requirements  Disposition: Discharge home  Discharge (Date  Time): 08/30/2022; 1136 hrs.   Primary Care Physician: Rueben Bash, NP Location: [facility] Note by: Oswaldo Done, MD (TTS technology used. I apologize for any typographical errors that were not detected and corrected.) Date: 08/30/2022; Time: 11:39 AM  Disclaimer:  Medicine is not an Visual merchandiser. The only guarantee in medicine is that nothing is guaranteed. It  is important to note that the decision to proceed with this intervention was based on the information collected from the patient. The Data and conclusions were drawn from the patient's questionnaire, the interview, and the physical examination. Because the information was provided in large part by the patient, it cannot be guaranteed that it has not been purposely or unconsciously manipulated. Every effort has been made to obtain as much relevant data as possible for this evaluation. It is important to note that the conclusions that lead to this procedure are derived in large part from the available data. Always take into account that the treatment will also be dependent on availability of resources and existing treatment guidelines, considered by other Pain Management Practitioners as being common knowledge and practice, at the time of the intervention. For Medico-Legal purposes, it is also important to point out that variation in procedural techniques and pharmacological choices are the acceptable norm. The indications, contraindications, technique, and results of the above procedure should only be interpreted and judged by a Board-Certified Interventional Pain Specialist with extensive familiarity and expertise in the same exact procedure and technique.

## 2022-08-31 ENCOUNTER — Telehealth: Payer: Self-pay

## 2022-08-31 NOTE — Telephone Encounter (Signed)
Called PP, no answer. Left message to call if needed.

## 2022-09-17 ENCOUNTER — Telehealth: Payer: Self-pay

## 2022-09-17 NOTE — Progress Notes (Unsigned)
Patient: Todd Bradford  Service Category: E/M  Provider: Oswaldo Done, MD  DOB: [birth year]  DOS: 09/18/2022  Location: Office  MRN: [id number]  Setting: Ambulatory outpatient  Referring Provider: Rueben Bash, NP  Type: Established Patient  Specialty: Interventional Pain Management  PCP: Rueben Bash, NP  Location: Remote location  Delivery: TeleHealth     Virtual Encounter - Pain Management PROVIDER NOTE: Information contained herein reflects review and annotations entered in association with encounter. Interpretation of such information and data should be left to medically-trained personnel. Information provided to patient can be located elsewhere in the medical record under "Patient Instructions". Document created using STT-dictation technology, any transcriptional errors that may result from process are unintentional.    Contact & Pharmacy Preferred: [phone] Home: [phone] (home) Mobile: [phone] (mobile) E-mail: [email or web address]  [facility] - [place], [place] - [address] [place] [address] Phone: [phone] Fax: [phone]  [id number] - [address],  - [address] AT [facility] [address] [place] [address] Phone: [phone] Fax: [phone]   Pre-screening  [name] offered "in-person" vs "virtual" encounter. He indicated preferring virtual for this encounter.   Reason COVID-19*  Social distancing based on CDC and AMA recommendations.   I contacted Darius Bump on 09/18/2022 via telephone.      I clearly identified myself as Oswaldo Done, MD. I verified that I was speaking with the correct person using two identifiers (Name: Todd Bradford, and date of birth: 04/01/1950).  Consent I sought verbal advanced consent from Darius Bump for virtual visit interactions. I informed Mr. Bernhard of possible security and privacy concerns, risks, and limitations associated with providing  "not-in-person" medical evaluation and management services. I also informed Mr. Burczyk of the availability of "in-person" appointments. Finally, I informed him that there would be a charge for the virtual visit and that he could be  personally, fully or partially, financially responsible for it. Mr. Beh expressed understanding and agreed to proceed.   Historic Elements   [name] is a 72 y.o. year old, male patient evaluated today after our last contact on 08/30/2022. [name]  has a past medical history of Hyperlipidemia. He also  has a past surgical history that includes Neck surgery (2000 approx); Spine surgery; and Prostate surgery (04/2022). [name] has a current medication list which includes the following prescription(s): vitamin c, aspirin ec, cyclobenzaprine, finasteride, tamsulosin, and tramadol. He  reports that he quit smoking about 17 years ago. His smoking use included cigarettes. He started smoking about 47 years ago. He has a 60 pack-year smoking history. He has never used smokeless tobacco. No history on file for alcohol use and drug use. [name] has No Known Allergies.  BMI: Estimated body mass index is 25.93 kg/m as calculated from the following:   Height as of 08/30/22: 6\' 4"  (1.93 m).   Weight as of 08/30/22: 213 lb (96.6 kg). Last encounter: 08/15/2022. Last procedure: 08/30/2022.  HPI  Today, he is being contacted for a post-procedure assessment.  Post-procedure evaluation   Procedure: Lumbar epidural steroid injection (LESI) (interlaminar) #2    Laterality: Left   Level:  L5-S1 Level.  Imaging: Fluoroscopic guidance Spinal ([id number]) Anesthesia: Local anesthesia (1-2% Lidocaine) Anxiolysis: None  the patient did not keep his n.p.o. order.          Sedation: No Sedation  DOS: 08/30/2022  Performed by: Oswaldo Done, MD  Purpose: Diagnostic/Therapeutic Indications: Lumbar radicular pain of intraspinal etiology of more than 4  weeks that has failed to respond to conservative therapy and is severe enough to impact quality of life or function. 1. 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 Chronic lower extremity pain (2ry area of Pain) (Left)   3. DDD (degenerative disc disease), lumbosacral   4. Anterolisthesis of lumbar spine L4/L5 (3mm)   5. Lumbosacral radiculopathy at L5 (Bilateral)   6. Lumbosacral radiculopathy at S1 (Bilateral)   7. 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 Numbness and tingling of lower extremity (Left)   10. Chronic anticoagulation (Xarelto)    NAS-11 Pain score:   Pre-procedure: 6 /10   Post-procedure: 0-No pain/10      Effectiveness:  Initial hour after procedure:   ***. Subsequent 4-6 hours post-procedure:   ***. Analgesia past initial 6 hours:   ***. Ongoing improvement:  Analgesic:  *** Function:    ***    ROM:    ***     Pharmacotherapy Assessment   Opioid Analgesic: No chronic opioid analgesics therapy prescribed by our practice. Tramadol 50 mg, half a tablet p.o. daily MME/day: 2.5 mg/day   Monitoring: [place] PMP: PDMP reviewed during this encounter.       Pharmacotherapy: No side-effects or adverse reactions reported. Compliance: No problems identified. Effectiveness: Clinically acceptable. Plan: Refer to "POC". UDS:  Summary  Date Value Ref Range Status  02/16/2020 FINAL  Final    Comment:    ==================================================================== [facility] COMP DRUG ANALYSIS,UR ==================================================================== Test                             Result       Flag       Units Drug Present and Declared for Prescription Verification   Tramadol                       5215         EXPECTED   ng/mg creat   O-Desmethyltramadol            6226         EXPECTED   ng/mg creat   N-Desmethyltramadol             1269         EXPECTED   ng/mg creat    Source of tramadol is a prescription medication.    O-desmethyltramadol and N-desmethyltramadol are expected    metabolites of tramadol.    Cyclobenzaprine                PRESENT      EXPECTED   Desmethylcyclobenzaprine       PRESENT      EXPECTED    Desmethylcyclobenzaprine is an expected metabolite of    cyclobenzaprine.  Drug Present not Declared for Prescription Verification   Ephedrine/Pseudoephedrine      PRESENT      UNEXPECTED   Phenylpropanolamine            PRESENT      UNEXPECTED    Source of ephedrine/pseudoephedrine is most commonly    pseudoephedrine in over-the-counter or prescription cold and    allergy medications. Phenylpropanolamine is an expected    metabolite of ephedrine/pseudoephedrine.  Orphenadrine                   PRESENT      UNEXPECTED   Ibuprofen                      PRESENT      UNEXPECTED   Diphenhydramine                PRESENT      UNEXPECTED ==================================================================== Test                      Result    Flag   Units      Ref Range   Creatinine              74               mg/dL      >=16 ==================================================================== Declared Medications:  The flagging and interpretation on this report are based on the  following declared medications.  Unexpected results may arise from  inaccuracies in the declared medications.   **Note: The testing scope of this panel includes these medications:   Cyclobenzaprine (Flexeril)  Tramadol (Ultram)   **Note: The testing scope of this panel does not include following  reported medications:   Atorvastatin (Lipitor)  Finasteride (Proscar)  Tamsulosin (Flomax) ==================================================================== For clinical consultation, please call [phone]. ====================================================================    No results found for:  "CBDTHCR", "D8THCCBX", "D9THCCBX"   Laboratory Chemistry Profile   Renal Lab Results  Component Value Date   BUN 26 (H) 03/01/2020   CREATININE 1.24 03/01/2020   BCR 18 02/15/2020   GFRAA 64 02/15/2020   GFRNONAA >60 03/01/2020    Hepatic Lab Results  Component Value Date   AST 45 (H) 03/01/2020   ALT 77 (H) 03/01/2020   ALBUMIN 3.6 03/01/2020   ALKPHOS 161 (H) 03/01/2020    Electrolytes Lab Results  Component Value Date   NA 136 03/01/2020   K 4.3 03/01/2020   CL 100 03/01/2020   CALCIUM 8.9 03/01/2020   MG 2.3 02/15/2020    Bone Lab Results  Component Value Date   25OHVITD1 37 02/15/2020   25OHVITD2 <1.0 02/15/2020   25OHVITD3 37 02/15/2020    Inflammation (CRP: Acute Phase) (ESR: Chronic Phase) Lab Results  Component Value Date   CRP 9 02/15/2020   ESRSEDRATE 19 02/15/2020         Note: Above Lab results reviewed.  Imaging  DG PAIN CLINIC C-ARM 1-60 MIN NO REPORT Fluoro was used, but no Radiologist interpretation will be provided.  Please refer to "NOTES" tab for provider progress note.  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 Diagnoses of Chronic lower extremity pain (2ry area of Pain) (Left), Lumbosacral radiculopathy at S1 (Bilateral), Numbness and tingling of lower extremity (Left), and Postop check were also pertinent to this visit.  Plan of Care  Problem-specific:  No problem-specific Assessment & Plan notes found for this encounter.  Mr. Eathyn Brees is not on any long-term medications.  Pharmacotherapy (Medications Ordered): No orders of the defined types were placed in this encounter.  Orders:  No orders of the defined types were placed in this encounter.  Follow-up plan:   No follow-ups on file.      Interventional Therapies  Risk  Complexity Considerations:   Xarelto ANTICOAGULATION: (Stop: 3 days  Restart: 6 hours)   Planned  Pending:   Therapeutic  left L5-S1 LESI #2    Under consideration:   Diagnostic bilateral (L2-S1) lumbar facet MBB #1  Therapeutic midline L2-3 LESI #1  Therapeutic L2-3, L3-4, and L4-5 MILD procedure   Completed:   Diagnostic/therapeutic left L3 & L4 TESI x1 (09/26/2021) (100/100/100/100 x 2 months) Therapeutic left L5 TFESI x2 (04/25/2021) (100/100/100/90-100)  Therapeutic left L5-S1 LESI x1 (02/06/2022) (100/100/70/ LBP:70  LLEP:100)  Therapeutic left L3-4 LESI x1 (02/16/2020) (90/90/65/65)  Therapeutic right L3-4 LESI x1 (04/25/2021) (100/100/100/90-100)    Therapeutic  Palliative (PRN) options:          Recent Visits Date Type Provider Dept  08/30/22 Procedure visit Delano Metz, MD [facility]  08/15/22 Office Visit Delano Metz, MD [facility]  Showing recent visits within past 90 days and meeting all other requirements Future Appointments Date Type Provider Dept  09/18/22 Appointment Delano Metz, MD [facility]  Showing future appointments within next 90 days and meeting all other requirements  I discussed the assessment and treatment plan with the patient. The patient was provided an opportunity to ask questions and all were answered. The patient agreed with the plan and demonstrated an understanding of the instructions.  Patient advised to call back or seek an in-person evaluation if the symptoms or condition worsens.  Duration of encounter: *** minutes.  Note by: Oswaldo Done, MD Date: 09/18/2022; Time: 6:54 AM

## 2022-09-17 NOTE — Telephone Encounter (Signed)
Attempted to call patient, no answer, Left message to call our office  before VV on 09/18/22

## 2022-09-18 ENCOUNTER — Ambulatory Visit: Payer: Medicare Other | Attending: Pain Medicine | Admitting: Pain Medicine

## 2022-09-18 DIAGNOSIS — M5442 Lumbago with sciatica, left side: Secondary | ICD-10-CM

## 2022-09-18 DIAGNOSIS — M5417 Radiculopathy, lumbosacral region: Secondary | ICD-10-CM

## 2022-09-18 DIAGNOSIS — G8929 Other chronic pain: Secondary | ICD-10-CM

## 2022-09-18 DIAGNOSIS — R202 Paresthesia of skin: Secondary | ICD-10-CM | POA: Diagnosis not present

## 2022-09-18 DIAGNOSIS — Z09 Encounter for follow-up examination after completed treatment for conditions other than malignant neoplasm: Secondary | ICD-10-CM

## 2022-09-18 DIAGNOSIS — M79605 Pain in left leg: Secondary | ICD-10-CM | POA: Diagnosis not present

## 2022-09-18 DIAGNOSIS — R200 Anesthesia of skin: Secondary | ICD-10-CM | POA: Diagnosis not present

## 2022-11-10 IMAGING — CR DG LUMBAR SPINE COMPLETE W/ BEND
1 series · 7 of 7 positions shown · non-contrast
Comparison: None.

CLINICAL DATA: Chronic lumbosacral back pain. Possible radicular
component.

EXAM:
LUMBAR SPINE - COMPLETE WITH BENDING VIEWS

[Series 1: dg lumbar spine complete w/bend 6+v · 0.14mm/px · 7 of 7 slices shown]
[im 1/7]
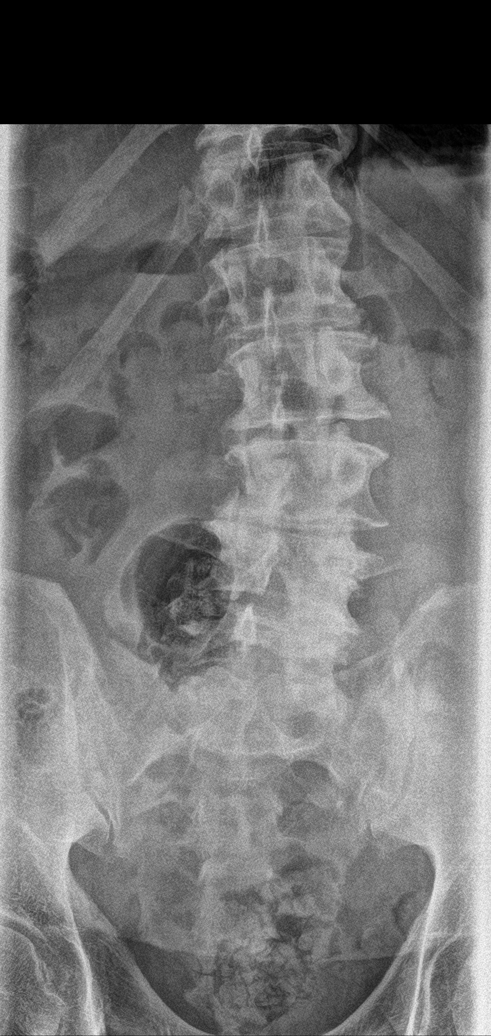
[im 2/7]
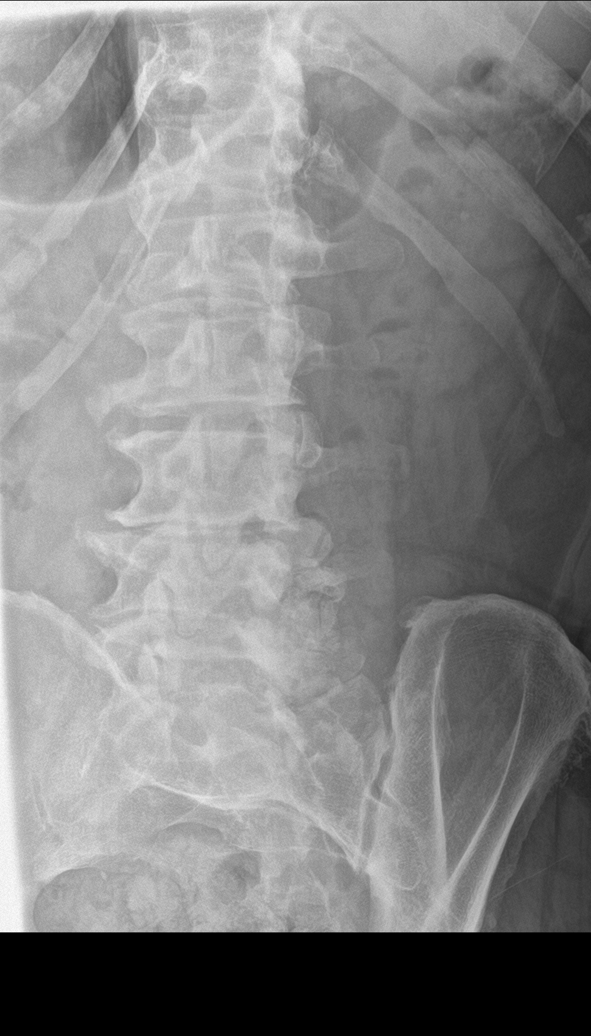
[im 3/7]
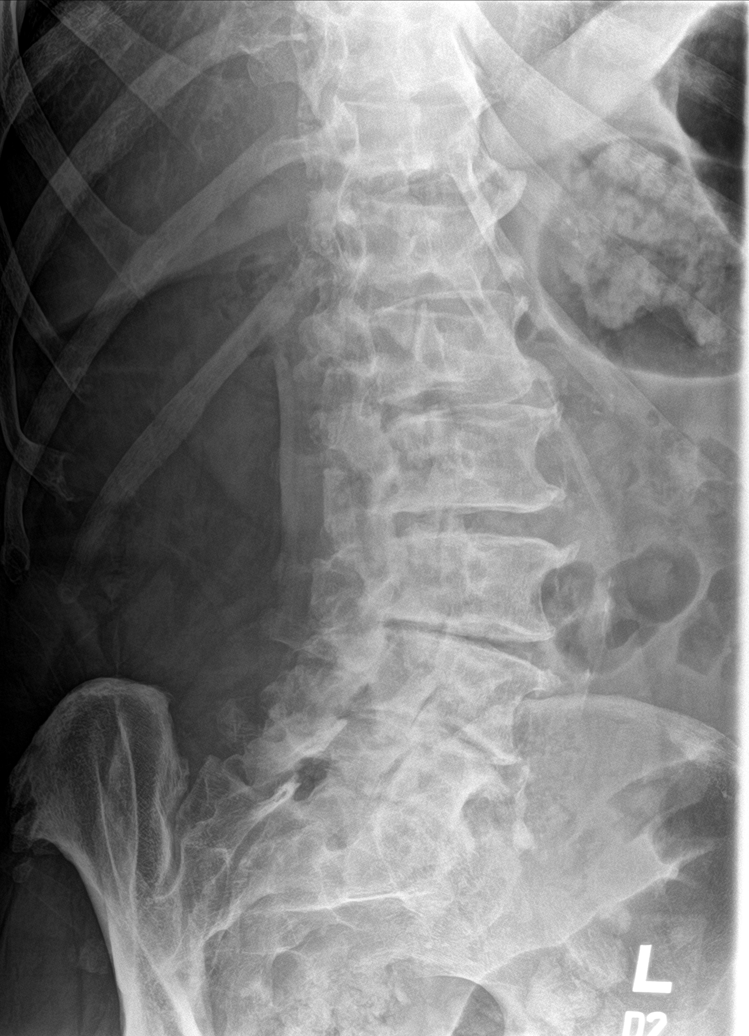
[im 4/7]
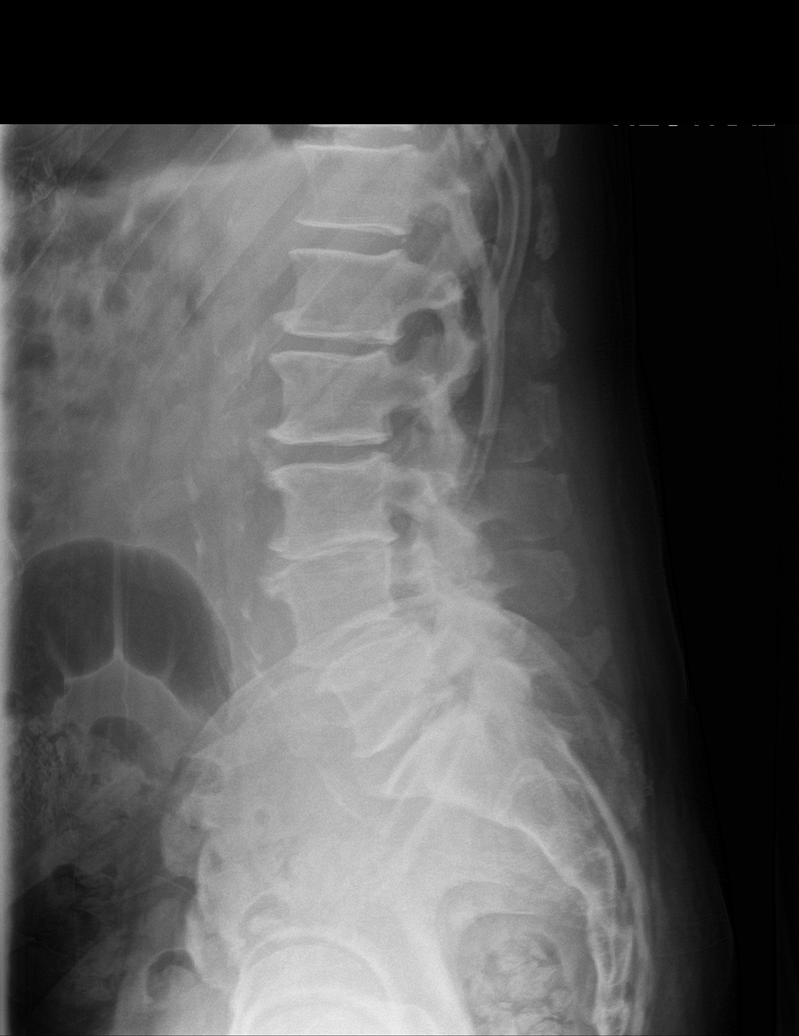
[im 5/7]
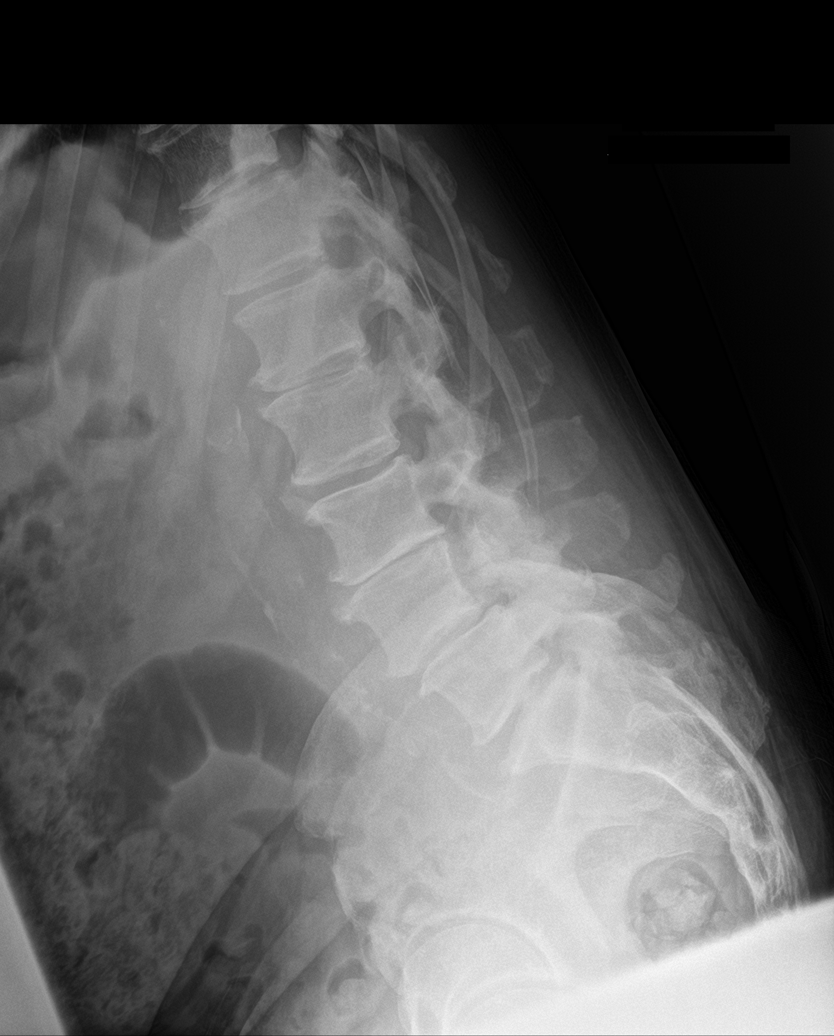
[im 6/7]
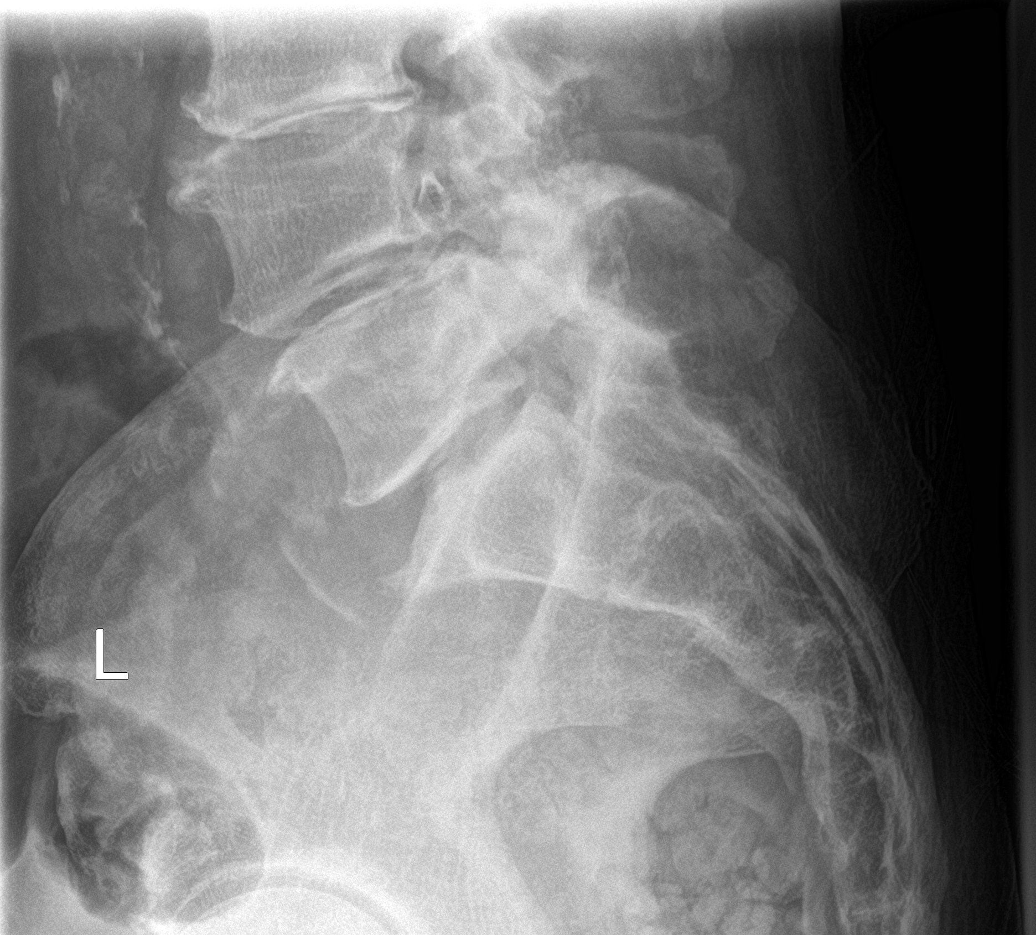
[im 7/7]
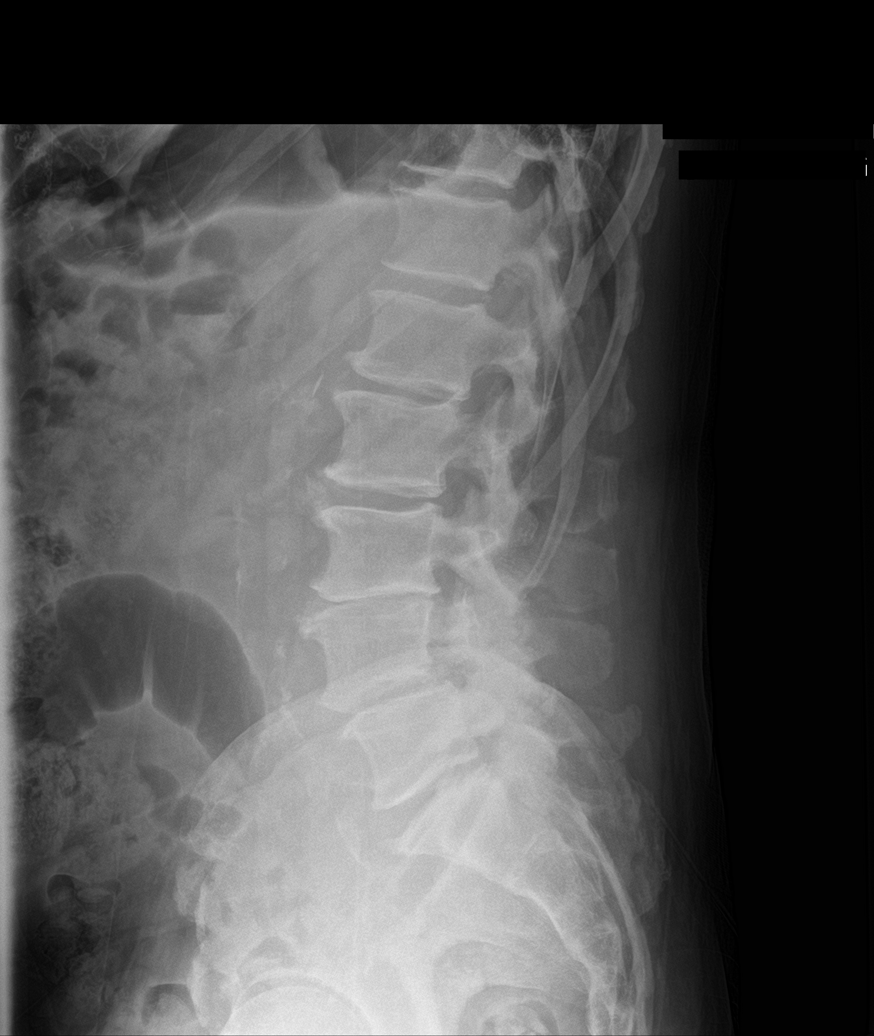

[7 of 7 positions shown; findings below may reference images not displayed]

FINDINGS: Weightbearing AP, lateral, lateral flexion, lateral extension, and
bilateral oblique views obtained. Broad-based levo scoliotic
curvature of 22 degrees when measured from superior endplate of all
L1 to inferior endplate of L4. 5 mm anterolisthesis of L4 on L5 that
is unchanged on flexion and extension. Endplate spurring at all
levels, with disc space narrowing at L1-L2, L3-L4, L4-L5, and L5-S1.
There is facet hypertrophy at L3-L4 and L4-L5. Vertebral body
heights are normal. No evidence of acute or prior fracture. No focal
lesion or bony destruction. The sacroiliac joints are congruent.
IMPRESSION: 1. Levoconvex scoliotic curvature of the lumbar spine.
2. Diffuse endplate spurring with disc space narrowing at multiple
levels, most prominently L3-L4.
3. Facet hypertrophy at L3-L4 and L4-L5.
4. 5 mm anterolisthesis of L4 on L5. No evidence of instability with
flexion and extension.

## 2022-11-19 ENCOUNTER — Telehealth: Payer: Self-pay | Admitting: Pain Medicine

## 2022-11-19 NOTE — Telephone Encounter (Signed)
PT called stated that he wanted to schedule vv, appt to discuss lower back pain. PT stated that he normal have vv, appt because he lives so far.Can you let me know what Laban Emperor says than I will schedule appt. TY

## 2022-11-20 NOTE — Progress Notes (Signed)
Patient: Todd Bradford  Service Category: E/M  Provider: Oswaldo Done, MD  DOB: 1950/07/04  DOS: 11/21/2022  Location: Office  MRN: [id number]  Setting: Ambulatory outpatient  Referring Provider: Rueben Bash, NP  Type: Established Patient  Specialty: Interventional Pain Management  PCP: Rueben Bash, NP  Location: Remote location  Delivery: TeleHealth     Virtual Encounter - Pain Management PROVIDER NOTE: Information contained herein reflects review and annotations entered in association with encounter. Interpretation of such information and data should be left to medically-trained personnel. Information provided to patient can be located elsewhere in the medical record under "Patient Instructions". Document created using STT-dictation technology, any transcriptional errors that may result from process are unintentional.    Contact & Pharmacy Preferred: [phone] Home: [phone] (home) Mobile: [phone] (mobile) E-mail: [email or web address]  [facility] - [place], [place] - [address] [place] [address] Phone: [phone] Fax: [phone]  [id number] - [address], [place] - [address] AT [facility] [address] [place] [address] Phone: [phone] Fax: [phone]   Pre-screening  Todd Bradford offered "in-person" vs "virtual" encounter. He indicated preferring virtual for this encounter.   Reason COVID-19*  Social distancing based on CDC and AMA recommendations.   I contacted Todd Bradford on 11/21/2022 via telephone.      I clearly identified myself as Oswaldo Done, MD. I verified that I was speaking with the correct person using two identifiers (Name: Todd Bradford, and date of birth: 1950-03-14).  Consent I sought verbal advanced consent from Todd Bradford for virtual visit interactions. I informed Todd Bradford of possible security and privacy concerns, risks, and limitations associated with providing  "not-in-person" medical evaluation and management services. I also informed Todd Bradford of the availability of "in-person" appointments. Finally, I informed him that there would be a charge for the virtual visit and that he could be  personally, fully or partially, financially responsible for it. Todd Bradford expressed understanding and agreed to proceed.   Historic Elements   Todd Bradford is a 72 y.o. year old, male patient evaluated today after our last contact on 11/19/2022. Todd Bradford  has a past medical history of Hyperlipidemia. He also  has a past surgical history that includes Neck surgery (2000 approx); Spine surgery; and Prostate surgery (04/2022). Todd Bradford has a current medication list which includes the following prescription(s): vitamin c, cyclobenzaprine, finasteride, rivaroxaban, tramadol, and aspirin ec. He  reports that he quit smoking about 17 years ago. His smoking use included cigarettes. He started smoking about 47 years ago. He has a 60 pack-year smoking history. He has never used smokeless tobacco. No history on file for alcohol use and drug use. Todd Bradford has No Known Allergies.  BMI: Estimated body mass index is 25.93 kg/m as calculated from the following:   Height as of 08/30/22: 6\' 4"  (1.93 m).   Weight as of 08/30/22: 213 lb (96.6 kg). Last encounter: 09/18/2022. Last procedure: 08/30/2022.  HPI  Today, he is being contacted for worsening of previously known (established) problem.  Discussed the use of AI scribe software for clinical note transcription with the patient, who gave verbal consent to proceed.  History of Present Illness   The patient, with a history of chronic lower back pain, reports a recent exacerbation of symptoms. The pain, previously localized to the left lower back and hip, has now shifted to the right side. The patient denies any  associated numbness or weakness in the right leg. The pain is most severe during standing and walking, limiting these  activities to five to ten minutes at a time before needing to sit down due to discomfort. The patient denies any recent imaging studies since the last MRI in 2023, which revealed multilevel lumbar spondylosis, most pronounced at the L3-4 level, with neuroforaminal narrowing more severe on the right side.  Interestingly, the patient reports an improvement in the previously experienced numbness in the feet and toes. He also mentions a need to lean forward while walking to alleviate the pain. The patient is currently on Xarelto, which he has been compliant with. The patient has previously undergone an epidural at the L5-S1 level for pain management.      Pharmacotherapy Assessment   Opioid Analgesic: No chronic opioid analgesics therapy prescribed by our practice. Tramadol 50 mg, half a tablet p.o. daily MME/day: 2.5 mg/day   Monitoring: [place] PMP: PDMP reviewed during this encounter.       Pharmacotherapy: No side-effects or adverse reactions reported. Compliance: No problems identified. Effectiveness: Clinically acceptable. Plan: Refer to "POC". UDS:  Summary  Date Value Ref Range Status  02/16/2020 FINAL  Final    Comment:    ==================================================================== [facility] COMP DRUG ANALYSIS,UR ==================================================================== Test                             Result       Flag       Units Drug Present and Declared for Prescription Verification   Tramadol                       5215         EXPECTED   ng/mg creat   O-Desmethyltramadol            6226         EXPECTED   ng/mg creat   N-Desmethyltramadol            1269         EXPECTED   ng/mg creat    Source of tramadol is a prescription medication.    O-desmethyltramadol and N-desmethyltramadol are expected    metabolites of tramadol.    Cyclobenzaprine                PRESENT      EXPECTED   Desmethylcyclobenzaprine       PRESENT      EXPECTED    Desmethylcyclobenzaprine  is an expected metabolite of    cyclobenzaprine.  Drug Present not Declared for Prescription Verification   Ephedrine/Pseudoephedrine      PRESENT      UNEXPECTED   Phenylpropanolamine            PRESENT      UNEXPECTED    Source of ephedrine/pseudoephedrine is most commonly    pseudoephedrine in over-the-counter or prescription cold and    allergy medications. Phenylpropanolamine is an expected    metabolite of ephedrine/pseudoephedrine.    Orphenadrine                   PRESENT      UNEXPECTED   Ibuprofen                      PRESENT      UNEXPECTED   Diphenhydramine  PRESENT      UNEXPECTED ==================================================================== Test                      Result    Flag   Units      Ref Range   Creatinine              74               mg/dL      >=42 ==================================================================== Declared Medications:  The flagging and interpretation on this report are based on the  following declared medications.  Unexpected results may arise from  inaccuracies in the declared medications.   **Note: The testing scope of this panel includes these medications:   Cyclobenzaprine (Flexeril)  Tramadol (Ultram)   **Note: The testing scope of this panel does not include following  reported medications:   Atorvastatin (Lipitor)  Finasteride (Proscar)  Tamsulosin (Flomax) ==================================================================== For clinical consultation, please call [phone]. ====================================================================    No results found for: "CBDTHCR", "D8THCCBX", "D9THCCBX"   Laboratory Chemistry Profile   Renal Lab Results  Component Value Date   BUN 26 (H) 03/01/2020   CREATININE 1.24 03/01/2020   BCR 18 02/15/2020   GFRAA 64 02/15/2020   GFRNONAA >60 03/01/2020    Hepatic Lab Results  Component Value Date   AST 45 (H) 03/01/2020   ALT 77 (H) 03/01/2020    ALBUMIN 3.6 03/01/2020   ALKPHOS 161 (H) 03/01/2020    Electrolytes Lab Results  Component Value Date   NA 136 03/01/2020   K 4.3 03/01/2020   CL 100 03/01/2020   CALCIUM 8.9 03/01/2020   MG 2.3 02/15/2020    Bone Lab Results  Component Value Date   25OHVITD1 37 02/15/2020   25OHVITD2 <1.0 02/15/2020   25OHVITD3 37 02/15/2020    Inflammation (CRP: Acute Phase) (ESR: Chronic Phase) Lab Results  Component Value Date   CRP 9 02/15/2020   ESRSEDRATE 19 02/15/2020         Note: Above Lab results reviewed.  Imaging  DG PAIN CLINIC C-ARM 1-60 MIN NO REPORT Fluoro was used, but no Radiologist interpretation will be provided.  Please refer to "NOTES" tab for provider progress note.  Assessment  The primary encounter diagnosis was Acute exacerbation of chronic low back pain. Diagnoses of Acute low back pain (Right) w/o sciatica, Chronic low back pain (1ry area of Pain) (Left) w/ sciatica (Left), Abnormal MRI, lumbar spine (09/12/2021 & 01/16/2020), and Chronic anticoagulation (Xarelto) were also pertinent to this visit.  Plan of Care  Problem-specific:  Assessment and Plan    Lumbar Spondylosis   He has chronic lumbar spondylosis with multilevel involvement, most pronounced at L3-4, presenting with right lower back pain radiating to the right hip, exacerbated by standing and walking. The last MRI on 09/12/2021 revealed significant neuroforaminal narrowing at L3-4 on the right side. He reports no numbness or weakness in the right leg, and no numbness in the feet or toes. We discussed an epidural steroid injection at L3-4, including the necessity to stop Xarelto three days prior, fast for six hours before the procedure, and arrange for a driver. He prefers to undergo the procedure without sedation, as previously tolerated. We will schedule the epidural steroid injection at L3-4, instruct him to stop Xarelto three days prior to the procedure, advise fasting for six hours before the  procedure, ensure he has a driver for the procedure, and perform the procedure without sedation as previously tolerated.  Anticoagulation Management   He is on ongoing anticoagulation therapy with Xarelto and is aware of the need to stop Xarelto three days before procedures. We will continue Xarelto as prescribed and stop Xarelto three days before the epidural injection.      Todd Bradford is not on any long-term medications.  Pharmacotherapy (Medications Ordered): No orders of the defined types were placed in this encounter.  Orders:  Orders Placed This Encounter  Procedures   Lumbar Epidural Injection    Standing Status:   Future    Standing Expiration Date:   02/21/2023    Scheduling Instructions:     Procedure: Interlaminar Lumbar Epidural Steroid injection (LESI)  L3-4     Laterality: Right-sided     Sedation: None required.     Timeframe: ASAA    Order Specific Question:   Where will this procedure be performed?    Answer:   [facility]   Blood Thinner Instructions to Nursing    Always make sure patient has clearance from prescribing physician to stop blood thinners for interventional therapies. If the patient requires a Lovenox-bridge therapy, make sure arrangements are made to institute it with the assistance of the PCP.    Scheduling Instructions:     Have Todd Bradford stop the Xarelto (Rivaroxaban) x 3 days prior to procedure or surgery.   Follow-up plan:   Return for [facility]): (NS) (R) L3-4 LESI #1, (Blood Thinner Protocol).      Interventional Therapies  Risk  Complexity Considerations:   Xarelto ANTICOAGULATION: (Stop: 3 days  Restart: 6 hours)   Planned  Pending:   Therapeutic right L3-4 LESI #1    Under consideration:   Diagnostic bilateral (L2-S1) lumbar facet MBB #1  Therapeutic midline L2-3 LESI #1  Therapeutic L2-3, L3-4, and L4-5 MILD procedure   Completed:   Diagnostic/therapeutic left L3 & L4 TESI x1 (09/26/2021) (100/100/100/100 x 2  months) Therapeutic left L5 TFESI x2 (04/25/2021) (100/100/100/90-100)  Therapeutic left L5-S1 LESI x2 (08/30/2022) (100/100/70/ LBP:90  LLEP:100)  Therapeutic left L3-4 LESI x1 (02/16/2020) (90/90/65/65)  Therapeutic right L3-4 LESI x1 (04/25/2021) (100/100/100/90-100)    Therapeutic  Palliative (PRN) options:         Recent Visits Date Type Provider Dept  09/18/22 Office Visit Delano Metz, MD [facility]  08/30/22 Procedure visit Delano Metz, MD [facility]  Showing recent visits within past 90 days and meeting all other requirements Today's Visits Date Type Provider Dept  11/21/22 Office Visit Delano Metz, MD [facility]  Showing today's visits and meeting all other requirements Future Appointments No visits were found meeting these conditions. Showing future appointments within next 90 days and meeting all other requirements  I discussed the assessment and treatment plan with the patient. The patient was provided an opportunity to ask questions and all were answered. The patient agreed with the plan and demonstrated an understanding of the instructions.  Patient advised to call back or seek an in-person evaluation if the symptoms or condition worsens.  Duration of encounter: 15 minutes.  Note by: Oswaldo Done, MD Date: 11/21/2022; Time: 12:56 PM

## 2022-11-21 ENCOUNTER — Encounter: Payer: Self-pay | Admitting: Pain Medicine

## 2022-11-21 ENCOUNTER — Ambulatory Visit: Payer: Medicare Other | Attending: Pain Medicine | Admitting: Pain Medicine

## 2022-11-21 DIAGNOSIS — M5442 Lumbago with sciatica, left side: Secondary | ICD-10-CM | POA: Diagnosis not present

## 2022-11-21 DIAGNOSIS — G8929 Other chronic pain: Secondary | ICD-10-CM | POA: Diagnosis not present

## 2022-11-21 DIAGNOSIS — Z7901 Long term (current) use of anticoagulants: Secondary | ICD-10-CM | POA: Diagnosis not present

## 2022-11-21 DIAGNOSIS — R937 Abnormal findings on diagnostic imaging of other parts of musculoskeletal system: Secondary | ICD-10-CM | POA: Diagnosis not present

## 2022-11-21 DIAGNOSIS — M545 Low back pain, unspecified: Secondary | ICD-10-CM | POA: Insufficient documentation

## 2022-11-21 NOTE — Patient Instructions (Signed)
______________________________________________________________________    Blood Thinners  IMPORTANT NOTICE:  If you take any of these, make sure to notify the nursing staff.  Failure to do so may result in serious injury.  Recommended time intervals to stop and restart blood-thinners, before & after invasive procedures  Generic Name Brand Name Pre-procedure: Stop medication for this amount of time before your procedure: Post-procedure: Wait this amount of time after the procedure before restarting your medication:  Abciximab Reopro 15 days 2 hrs  Alteplase Activase 10 days 10 days  Anagrelide Agrylin    Apixaban Eliquis 3 days 6 hrs  Cilostazol Pletal 3 days 5 hrs  Clopidogrel Plavix 7-10 days 2 hrs  Dabigatran Pradaxa 5 days 6 hrs  Dalteparin Fragmin 24 hours 4 hrs  Dipyridamole Aggrenox 11days 2 hrs  Edoxaban Lixiana; Savaysa 3 days 2 hrs  Enoxaparin  Lovenox 24 hours 4 hrs  Eptifibatide Integrillin 8 hours 2 hrs  Fondaparinux  Arixtra 72 hours 12 hrs  Hydroxychloroquine Plaquenil 11 days   Prasugrel Effient 7-10 days 6 hrs  Reteplase Retavase 10 days 10 days  Rivaroxaban Xarelto 3 days 6 hrs  Ticagrelor Brilinta 5-7 days 6 hrs  Ticlopidine Ticlid 10-14 days 2 hrs  Tinzaparin Innohep 24 hours 4 hrs  Tirofiban Aggrastat 8 hours 2 hrs  Warfarin Coumadin 5 days 2 hrs   Other medications with blood-thinning effects  Product indications Generic (Brand) names Note  Cholesterol Lipitor Stop 4 days before procedure  Blood thinner (injectable) Heparin (LMW or LMWH Heparin) Stop 24 hours before procedure  Cancer Ibrutinib (Imbruvica) Stop 7 days before procedure  Malaria/Rheumatoid Hydroxychloroquine (Plaquenil) Stop 11 days before procedure  Thrombolytics  10 days before or after procedures   Over-the-counter (OTC) Products with blood-thinning effects  Product Common names Stop Time  Aspirin > 325 mg Goody Powders, Excedrin, etc. 11 days  Aspirin <= 81 mg  7 days  Fish oil   4 days  Garlic supplements  7 days  Ginkgo biloba  36 hours  Ginseng  24 hours  NSAIDs Ibuprofen, Naprosyn, etc. 3 days  Vitamin E  4 days   ______________________________________________________________________     ______________________________________________________________________    Procedure instructions  Stop blood-thinners  Do not eat or drink fluids (other than water) for 6 hours before your procedure  No water for 2 hours before your procedure  Take your blood pressure medicine with a sip of water  Arrive 30 minutes before your appointment  If sedation is planned, bring suitable driver. Pennie Banter, Benedetto Goad, & public transportation are NOT APPROVED)  Carefully read the "Preparing for your procedure" detailed instructions  If you have questions call us at [phone]  ______________________________________________________________________      ______________________________________________________________________    Preparing for your procedure  Appointments: If you think you may not be able to keep your appointment, call 24-48 hours in advance to cancel. We need time to make it available to others.  During your procedure appointment there will be: No Prescription Refills. No disability issues to discussed. No medication changes or discussions.  Instructions: Food intake: Avoid eating anything solid for at least 8 hours prior to your procedure. Clear liquid intake: You may take clear liquids such as water up to 2 hours prior to your procedure. (No carbonated drinks. No soda.) Transportation: Unless otherwise stated by your physician, bring a driver. (Driver cannot be a Market researcher, Pharmacist, community, or any other form of public transportation.) Morning Medicines: Except for blood thinners, take all of your other morning medications  with a sip of water. Make sure to take your heart and blood pressure medicines. If your blood pressure's lower number is above 100, the case will be  rescheduled. Blood thinners: Make sure to stop your blood thinners as instructed.  If you take a blood thinner, but were not instructed to stop it, call our office [phone] and ask to talk to a nurse. Not stopping a blood thinner prior to certain procedures could lead to serious complications. Diabetics on insulin: Notify the staff so that you can be scheduled 1st case in the morning. If your diabetes requires high dose insulin, take only  of your normal insulin dose the morning of the procedure and notify the staff that you have done so. Preventing infections: Shower with an antibacterial soap the morning of your procedure.  Build-up your immune system: Take 1000 mg of Vitamin C with every meal (3 times a day) the day prior to your procedure. Antibiotics: Inform the nursing staff if you are taking any antibiotics or if you have any conditions that may require antibiotics prior to procedures. (Example: recent joint implants)   Pregnancy: If you are pregnant make sure to notify the nursing staff. Not doing so may result in injury to the fetus, including death.  Sickness: If you have a cold, fever, or any active infections, call and cancel or reschedule your procedure. Receiving steroids while having an infection may result in complications. Arrival: You must be in the facility at least 30 minutes prior to your scheduled procedure. Tardiness: Your scheduled time is also the cutoff time. If you do not arrive at least 15 minutes prior to your procedure, you will be rescheduled.  Children: Do not bring any children with you. Make arrangements to keep them home. Dress appropriately: There is always a possibility that your clothing may get soiled. Avoid long dresses. Valuables: Do not bring any jewelry or valuables.  Reasons to call and reschedule or cancel your procedure: (Following these recommendations will minimize the risk of a serious complication.) Surgeries: Avoid having procedures within 2  weeks of any surgery. (Avoid for 2 weeks before or after any surgery). Flu Shots: Avoid having procedures within 2 weeks of a flu shots or . (Avoid for 2 weeks before or after immunizations). Barium: Avoid having a procedure within 7-10 days after having had a radiological study involving the use of radiological contrast. (Myelograms, Barium swallow or enema study). Heart attacks: Avoid any elective procedures or surgeries for the initial 6 months after a "Myocardial Infarction" (Heart Attack). Blood thinners: It is imperative that you stop these medications before procedures. Let us know if you if you take any blood thinner.  Infection: Avoid procedures during or within two weeks of an infection (including chest colds or gastrointestinal problems). Symptoms associated with infections include: Localized redness, fever, chills, night sweats or profuse sweating, burning sensation when voiding, cough, congestion, stuffiness, runny nose, sore throat, diarrhea, nausea, vomiting, cold or Flu symptoms, recent or current infections. It is specially important if the infection is over the area that we intend to treat. Heart and lung problems: Symptoms that may suggest an active cardiopulmonary problem include: cough, chest pain, breathing difficulties or shortness of breath, dizziness, ankle swelling, uncontrolled high or unusually low blood pressure, and/or palpitations. If you are experiencing any of these symptoms, cancel your procedure and contact your primary care physician for an evaluation.  Remember:  Regular Business hours are:  Monday to Thursday 8:00 AM to 4:00 PM  Provider's  Schedule: Delano Metz, MD:  Procedure days: Tuesday and Thursday 7:30 AM to 4:00 PM  Edward Jolly, MD:  Procedure days: Monday and Wednesday 7:30 AM to 4:00 PM Last  Updated: 08/21/2022 ______________________________________________________________________       ______________________________________________________________________    General Risks and Possible Complications  Patient Responsibilities: It is important that you read this as it is part of your informed consent. It is our duty to inform you of the risks and possible complications associated with treatments offered to you. It is your responsibility as a patient to read this and to ask questions about anything that is not clear or that you believe was not covered in this document.  Patient's Rights: You have the right to refuse treatment. You also have the right to change your mind, even after initially having agreed to have the treatment done. However, under this last option, if you wait until the last second to change your mind, you may be charged for the materials used up to that point.  Introduction: Medicine is not an Visual merchandiser. Everything in Medicine, including the lack of treatment(s), carries the potential for danger, harm, or loss (which is by definition: Risk). In Medicine, a complication is a secondary problem, condition, or disease that can aggravate an already existing one. All treatments carry the risk of possible complications. The fact that a side effects or complications occurs, does not imply that the treatment was conducted incorrectly. It must be clearly understood that these can happen even when everything is done following the highest safety standards.  No treatment: You can choose not to proceed with the proposed treatment alternative. The "PRO(s)" would include: avoiding the risk of complications associated with the therapy. The "CON(s)" would include: not getting any of the treatment benefits. These benefits fall under one of three categories: diagnostic; therapeutic; and/or palliative. Diagnostic benefits include: getting information which can ultimately lead to improvement of the disease or symptom(s). Therapeutic benefits are those associated with the successful  treatment of the disease. Finally, palliative benefits are those related to the decrease of the primary symptoms, without necessarily curing the condition (example: decreasing the pain from a flare-up of a chronic condition, such as incurable terminal cancer).  General Risks and Complications: These are associated to most interventional treatments. They can occur alone, or in combination. They fall under one of the following six (6) categories: no benefit or worsening of symptoms; bleeding; infection; nerve damage; allergic reactions; and/or death. No benefits or worsening of symptoms: In Medicine there are no guarantees, only probabilities. No healthcare provider can ever guarantee that a medical treatment will work, they can only state the probability that it may. Furthermore, there is always the possibility that the condition may worsen, either directly, or indirectly, as a consequence of the treatment. Bleeding: This is more common if the patient is taking a blood thinner, either prescription or over the counter (example: Goody Powders, Fish oil, Aspirin, Garlic, etc.), or if suffering a condition associated with impaired coagulation (example: Hemophilia, cirrhosis of the liver, low platelet counts, etc.). However, even if you do not have one on these, it can still happen. If you have any of these conditions, or take one of these drugs, make sure to notify your treating physician. Infection: This is more common in patients with a compromised immune system, either due to disease (example: diabetes, cancer, human immunodeficiency virus [HIV], etc.), or due to medications or treatments (example: therapies used to treat cancer and rheumatological diseases). However, even if  you do not have one on these, it can still happen. If you have any of these conditions, or take one of these drugs, make sure to notify your treating physician. Nerve Damage: This is more common when the treatment is an invasive one, but it  can also happen with the use of medications, such as those used in the treatment of cancer. The damage can occur to small secondary nerves, or to large primary ones, such as those in the spinal cord and brain. This damage may be temporary or permanent and it may lead to impairments that can range from temporary numbness to permanent paralysis and/or brain death. Allergic Reactions: Any time a substance or material comes in contact with our body, there is the possibility of an allergic reaction. These can range from a mild skin rash (contact dermatitis) to a severe systemic reaction (anaphylactic reaction), which can result in death. Death: In general, any medical intervention can result in death, most of the time due to an unforeseen complication. ______________________________________________________________________

## 2022-11-25 IMAGING — US US EXTREM LOW VENOUS*R*
1 series · 13 of 24 positions shown · non-contrast
Comparison: None.

CLINICAL DATA: 70-year-old male with right leg pain and swelling
after recent fall.

EXAM:
RIGHT LOWER EXTREMITY VENOUS DOPPLER ULTRASOUND
TECHNIQUE: Gray-scale sonography with graded compression, as well as color
Doppler and duplex ultrasound were performed to evaluate the right
lower extremity deep venous systems from the level of the common
femoral vein and including the common femoral, femoral, profunda
femoral, popliteal and calf veins including the posterior tibial,
peroneal and gastrocnemius veins when visible. Spectral Doppler was
utilized to evaluate flow at rest and with distal augmentation
maneuvers in the common femoral, femoral and popliteal veins. The
contralateral common femoral vein was also evaluated for comparison.

[Series 1: us venous img lower uni right (dvt) · portal-venous · 13 of 31 slices shown]
[im 1/31]
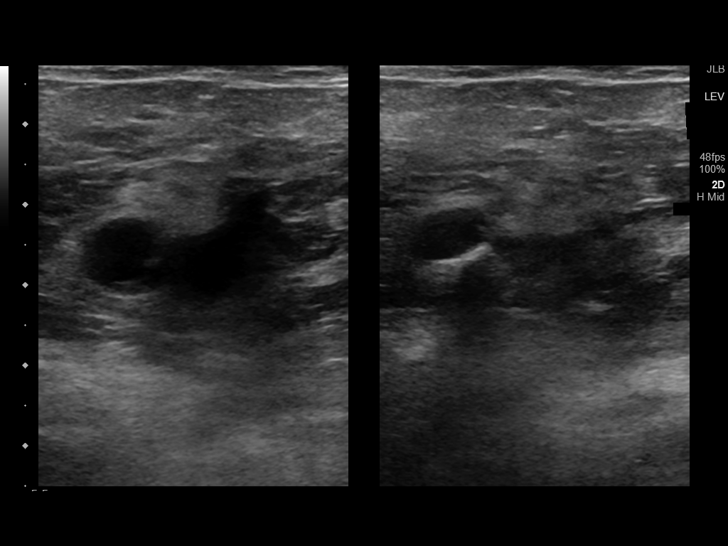
[im 3/31]
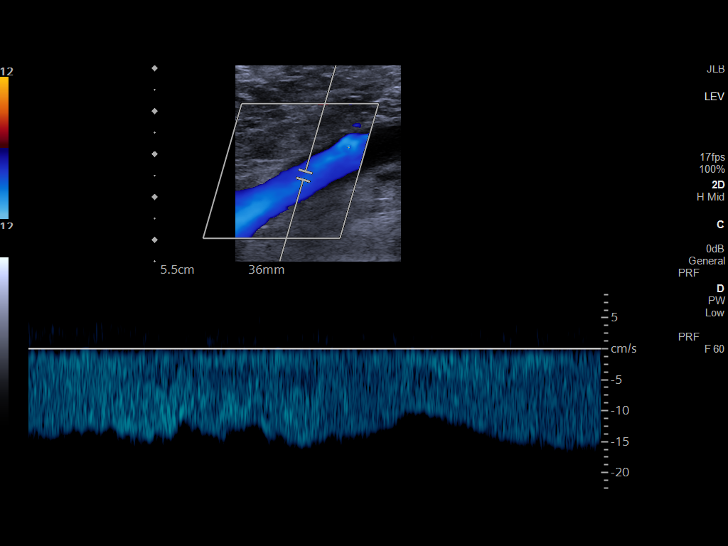
[im 6/31]
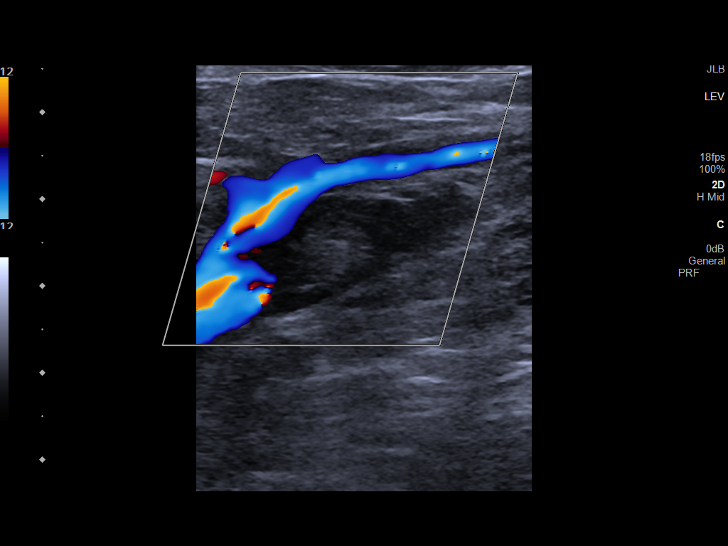
[im 8/31]
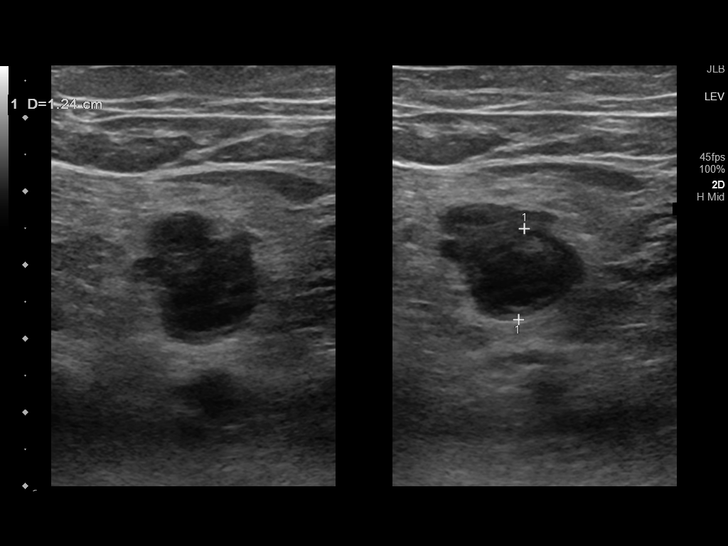
[im 11/31]
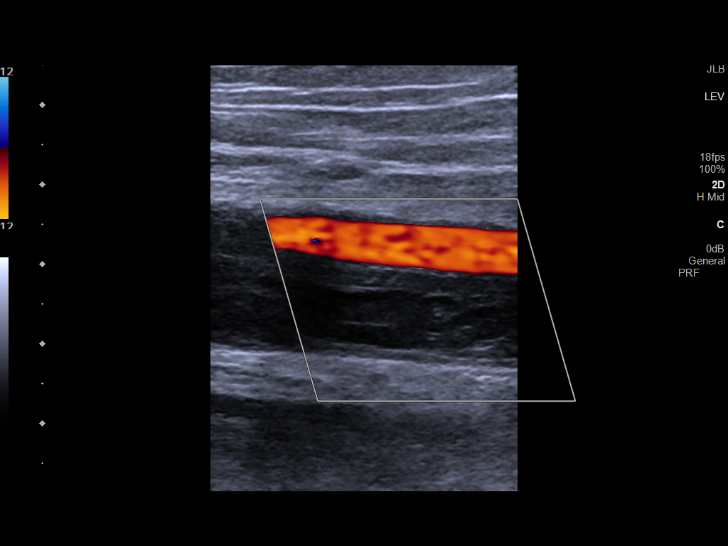
[im 14/31]
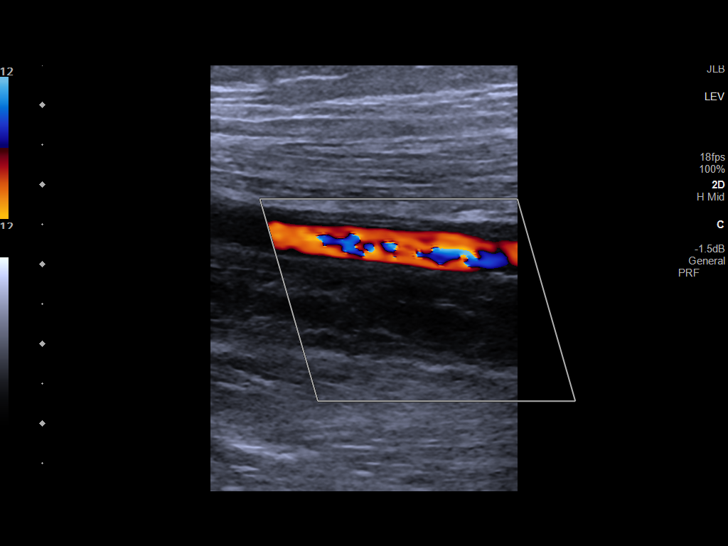
[im 16/31]
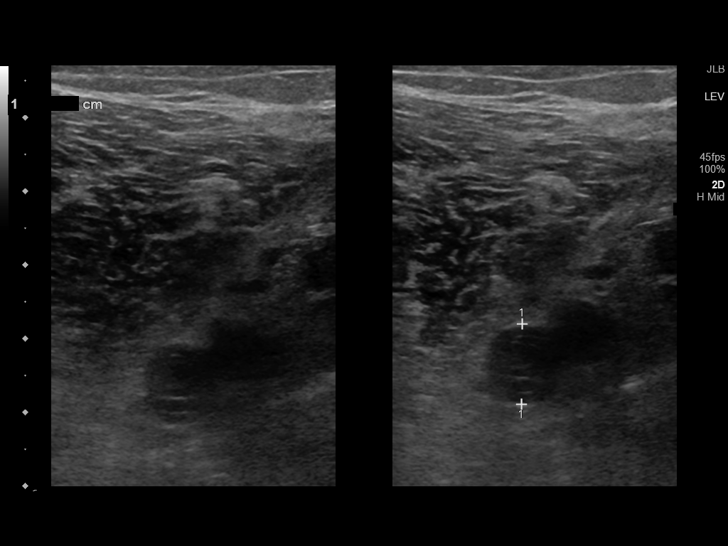
[im 17/31]
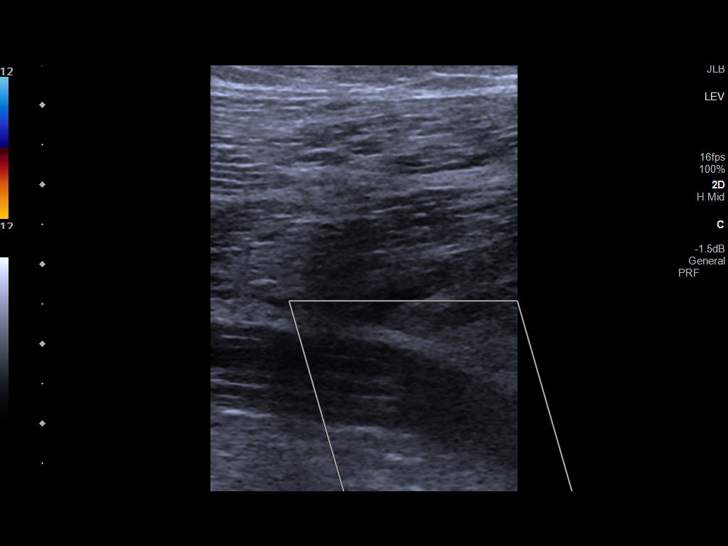
[im 20/31]
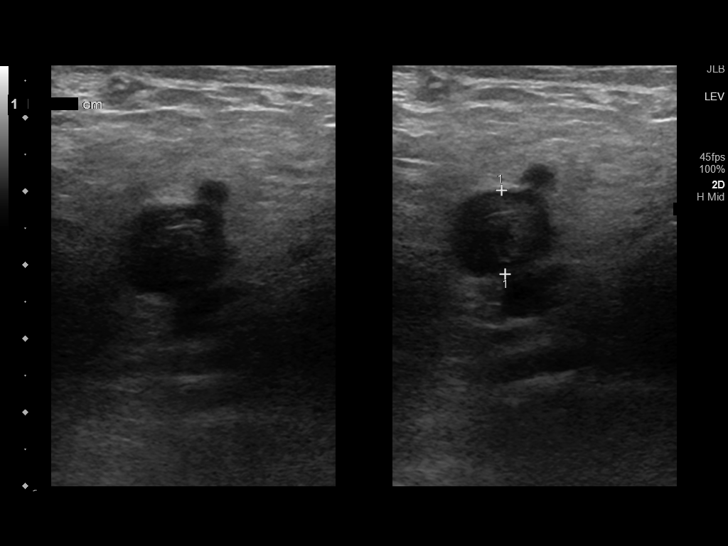
[im 23/31]
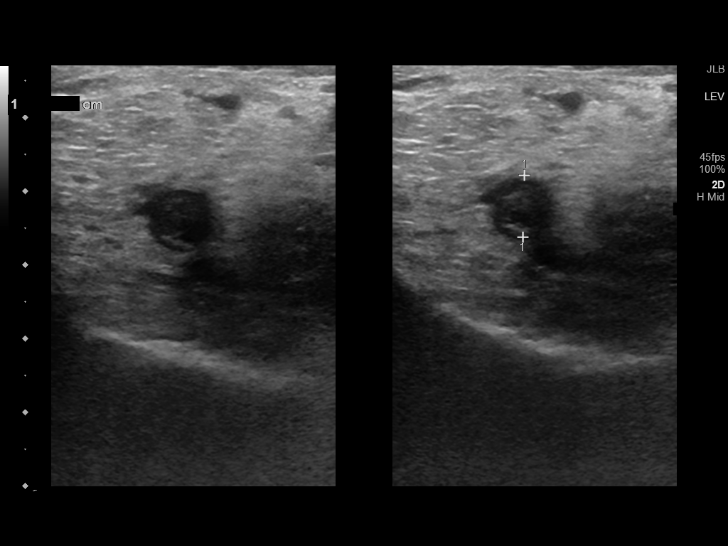
[im 25/31]
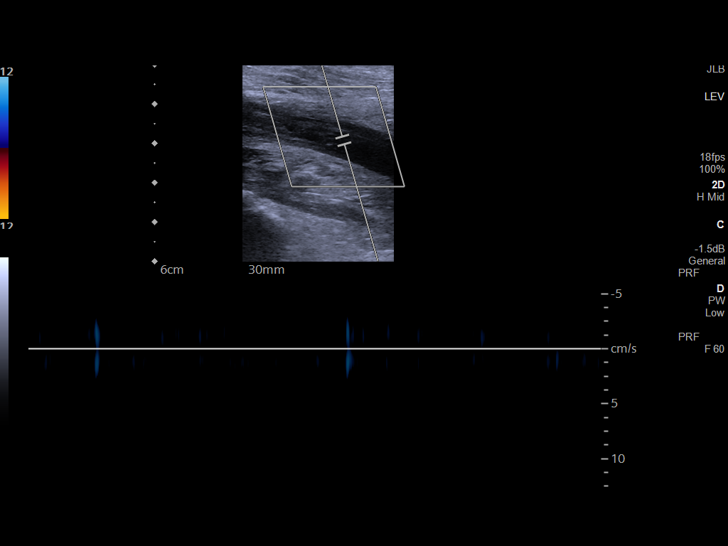
[im 28/31]
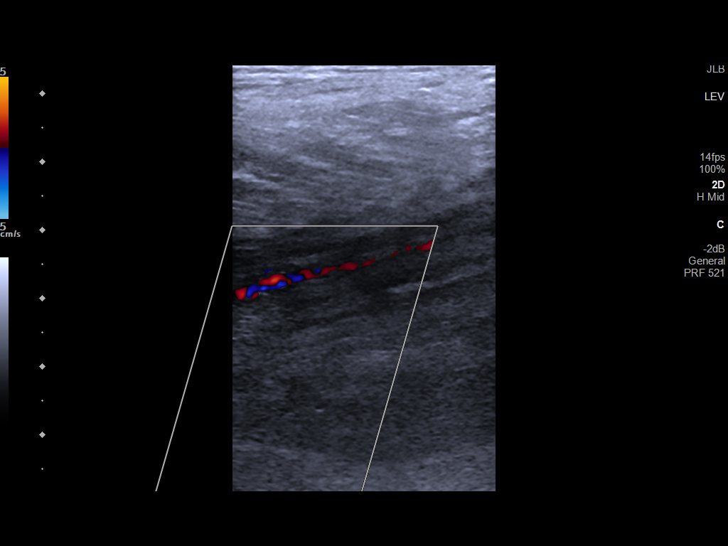
[im 31/31]
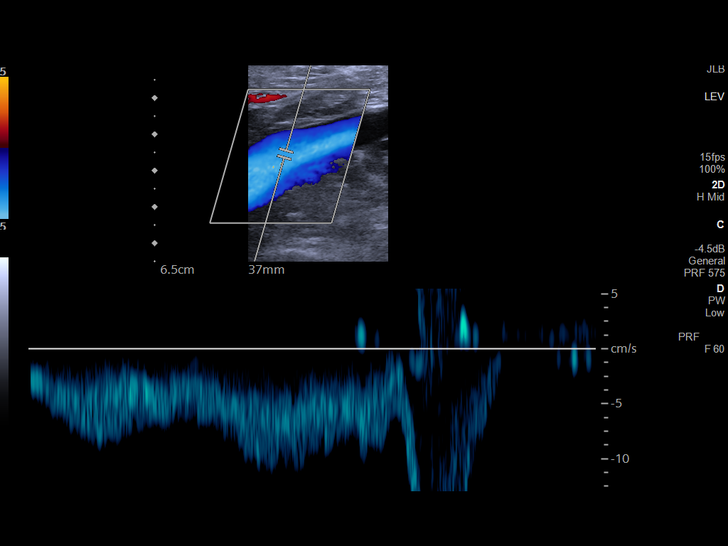

[13 of 24 positions shown; findings below may reference images not displayed]

FINDINGS: RIGHT LOWER EXTREMITY

Common Femoral Vein: No evidence of thrombus. Normal
compressibility, respiratory phasicity and response to augmentation.

Central Greater Saphenous Vein: No evidence of thrombus. Normal
compressibility and flow on color Doppler imaging.

Central Profunda Femoral Vein: No evidence of thrombus. Normal
compressibility and flow on color Doppler imaging.

Femoral Vein: Occlusive thrombus.

Popliteal Vein: Occlusive thrombus.

Calf Veins: Occlusive thrombus.

Other Findings: Simple appearing fluid collection in the right
popliteal fossa measuring approximately 2.6 x 0.9 x 1.9 cm.

LEFT LOWER EXTREMITY

Common Femoral Vein: No evidence of thrombus. Normal
compressibility, respiratory phasicity and response to augmentation.
IMPRESSION: 1. Acute appearing, occlusive deep vein thrombosis extending from
the central aspect of the superficial femoral vein into the calf
veins.
2. Incidentally noted right Baker cyst measuring up to 2.6 cm.

## 2022-11-25 IMAGING — CR DG KNEE 1-2V*R*
1 series · 2 of 2 positions shown · non-contrast
Comparison: None.

CLINICAL DATA: Right knee pain/arthralgia.  Pain and swelling.

EXAM:
RIGHT KNEE - 1-2 VIEW

[Series 1: dg knee 1-2 views right · 0.14mm/px · 2 of 2 slices shown]
[im 1/2]
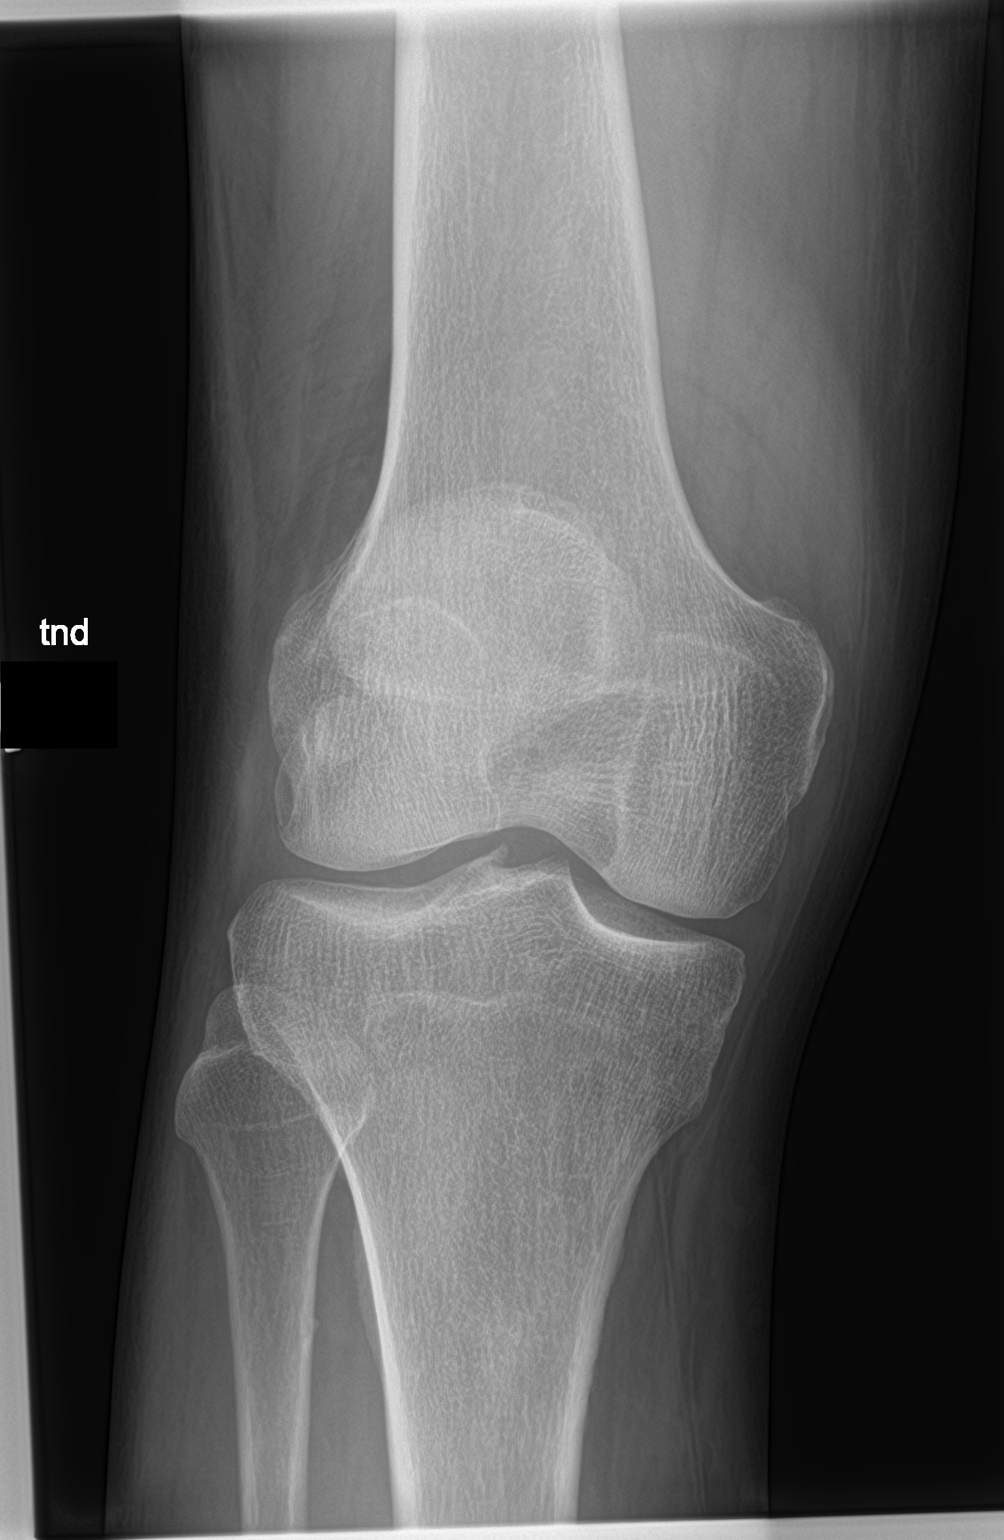
[im 2/2]
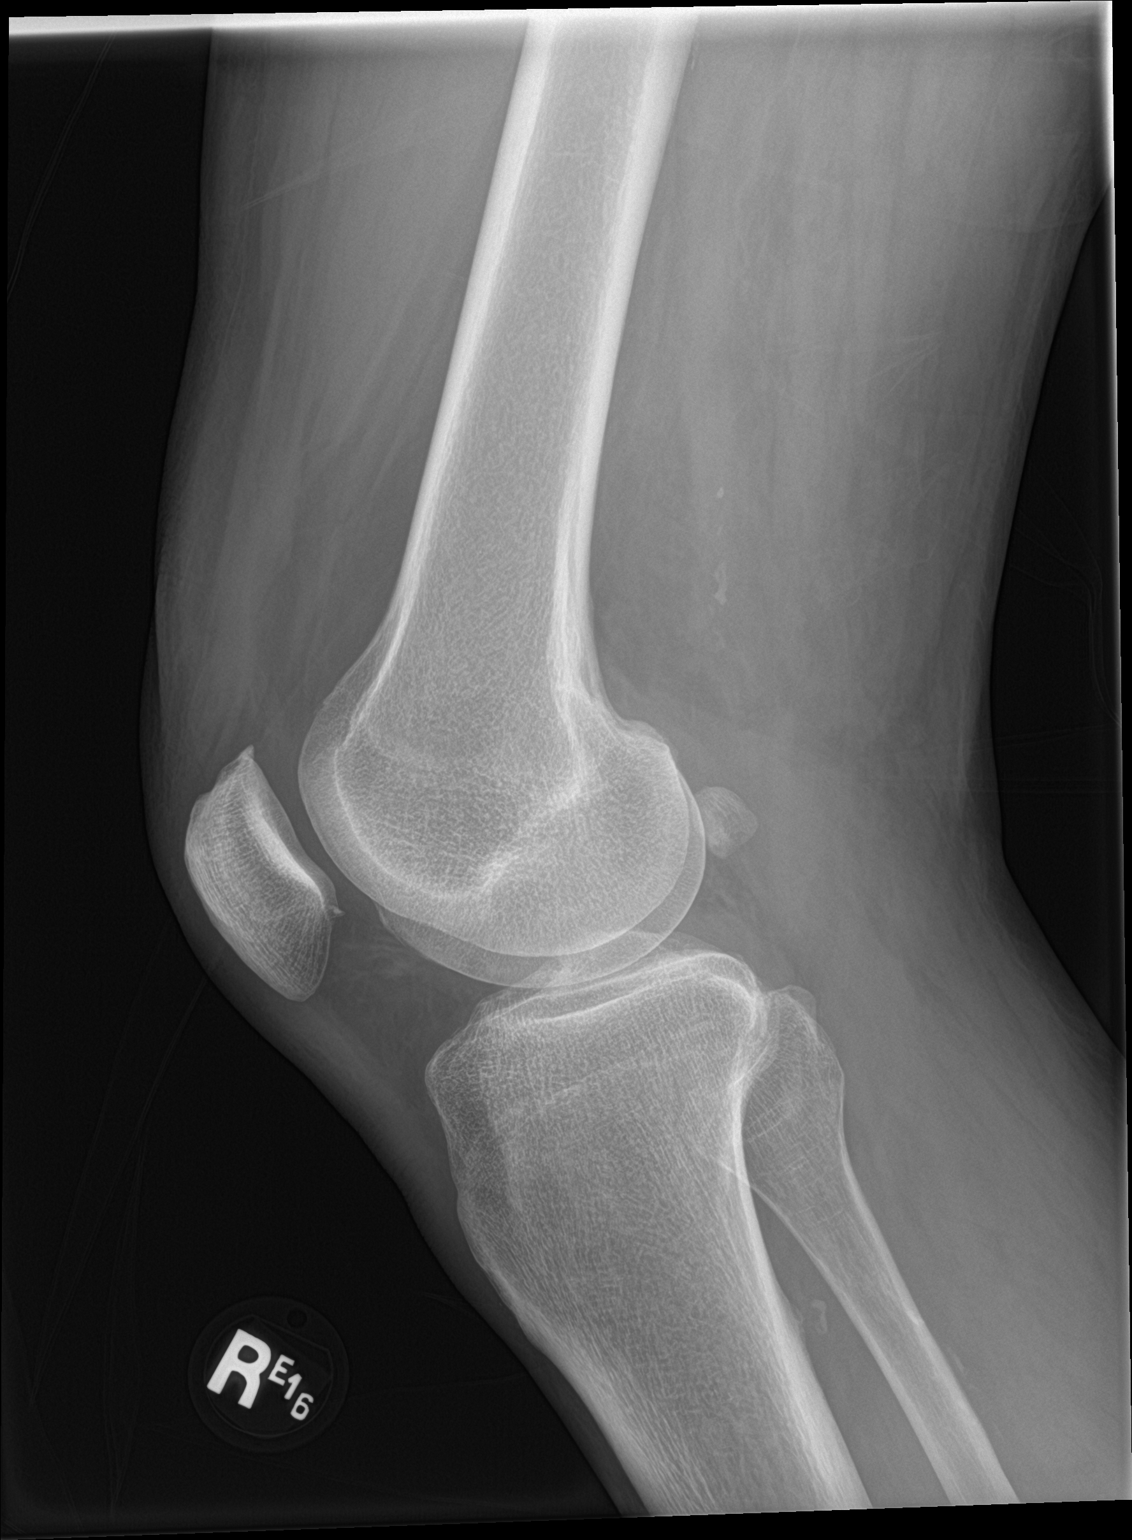

[2 of 2 positions shown; findings below may reference images not displayed]

FINDINGS: AP and lateral views obtained. Normal joint spaces and alignment.
Trace patellofemoral spurring and spurring of the tibial spines. No
erosion or focal bone lesion. Fabella is noted. There is a small
knee joint effusion.
IMPRESSION: 1. Small knee joint effusion.
2. Trace degenerative patellofemoral spurring and spurring of the
tibial spines.

## 2022-12-03 NOTE — Progress Notes (Unsigned)
PROVIDER NOTE: Interpretation of information contained herein should be left to medically-trained personnel. Specific patient instructions are provided elsewhere under "Patient Instructions" section of medical record. This document was created in part using STT-dictation technology, any transcriptional errors that may result from this process are unintentional.  Patient: Todd Bradford Type: Established DOB: [birth year] MRN: [id number] PCP: Rueben Bash, NP  Service: Procedure DOS: 12/04/2022 Setting: Ambulatory Location: Ambulatory outpatient facility Delivery: Face-to-face Provider: Oswaldo Done, MD Specialty: Interventional Pain Management Specialty designation: 09 Location: Outpatient facility Ref. Prov.: Delano Metz, MD       Interventional Therapy   Type: Lumbar epidural steroid injection (LESI) (interlaminar) #1    Laterality: Right   Level:  L3-4 Level.  Imaging: Fluoroscopic guidance Spinal ([id number]) Anesthesia: Local anesthesia (1-2% Lidocaine) Anxiolysis: None                 Sedation: No Sedation                       DOS: 12/04/2022  Performed by: Oswaldo Done, MD  Purpose: Diagnostic/Therapeutic Indications: Lumbar radicular pain of intraspinal etiology of more than 4 weeks that has failed to respond to conservative therapy and is severe enough to impact quality of life or function. 1. Inflammatory spondylopathy of lumbar region [facility])  (Right: L3-4)   2. 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 Lumbar lateral recess stenosis  (Right: L3-4)   4. 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 Degeneration of intervertebral disc of lumbar region with discogenic back pain and lower extremity pain   8. Abnormal MRI, lumbar spine (09/12/2021 & 01/16/2020)   9. Chronic anticoagulation (Xarelto)     NAS-11 Pain score:   Pre-procedure: 6 /10   Post-procedure: 6 /10      Position / Prep / Materials:  Position: Prone w/ head of the table raised (slight reverse trendelenburg) to facilitate breathing.  Prep solution: ChloraPrep (2% chlorhexidine gluconate and 70% isopropyl alcohol) Prep Area: Entire Posterior Lumbar Region from lower scapular tip down to mid buttocks area and from flank to flank. Materials:  Tray: Epidural tray Needle(s):  Type: Epidural needle (Tuohy) Gauge (G):  17 Length: Regular (3.5-in) Qty: 1   H&P (Pre-op Assessment):  Todd Bradford is a 72 y.o. (year old), male patient, seen today for interventional treatment. He  has a past surgical history that includes Neck surgery (2000 approx); Spine surgery; and Prostate surgery (04/2022). Todd Bradford has a current medication list which includes the following prescription(s): vitamin c, cyclobenzaprine, finasteride, rivaroxaban, tramadol, triamcinolone ointment, and atorvastatin. His primarily concern today is the Back Pain (Lower, right)  Initial Vital Signs:  Pulse/HCG Rate: 85ECG Heart Rate: 93 Temp: (!) 97.3 F (36.3 C) Resp: 16 BP: (!) 143/97 SpO2: 100 %  BMI: Estimated body mass index is 25.81 kg/m as calculated from the following:   Height as of this encounter: 6\' 4"  (1.93 m).   Weight as of this encounter: 212 lb (96.2 kg).  Risk Assessment: Allergies: Reviewed. He has No Known Allergies.  Allergy Precautions: None required Coagulopathies: Reviewed. None identified.  Blood-thinner therapy: None at this time Active Infection(s): Reviewed. None identified. Todd Bradford is afebrile  Site Confirmation: Todd Bradford was asked to confirm the procedure and laterality before marking the site Procedure checklist: Completed Consent: Before the  procedure and under the influence of no sedative(s), amnesic(s), or anxiolytics, the patient was informed of the treatment options, risks and possible complications. To  fulfill our ethical and legal obligations, as recommended by the American Medical Association's Code of Ethics, I have informed the patient of my clinical impression; the nature and purpose of the treatment or procedure; the risks, benefits, and possible complications of the intervention; the alternatives, including doing nothing; the risk(s) and benefit(s) of the alternative treatment(s) or procedure(s); and the risk(s) and benefit(s) of doing nothing. The patient was provided information about the general risks and possible complications associated with the procedure. These may include, but are not limited to: failure to achieve desired goals, infection, bleeding, organ or nerve damage, allergic reactions, paralysis, and death. In addition, the patient was informed of those risks and complications associated to Spine-related procedures, such as failure to decrease pain; infection (i.e.: Meningitis, epidural or intraspinal abscess); bleeding (i.e.: epidural hematoma, subarachnoid hemorrhage, or any other type of intraspinal or peri-dural bleeding); organ or nerve damage (i.e.: Any type of peripheral nerve, nerve root, or spinal cord injury) with subsequent damage to sensory, motor, and/or autonomic systems, resulting in permanent pain, numbness, and/or weakness of one or several areas of the body; allergic reactions; (i.e.: anaphylactic reaction); and/or death. Furthermore, the patient was informed of those risks and complications associated with the medications. These include, but are not limited to: allergic reactions (i.e.: anaphylactic or anaphylactoid reaction(s)); adrenal axis suppression; blood sugar elevation that in diabetics may result in ketoacidosis or comma; water retention that in patients with history of congestive heart failure may result in shortness of breath, pulmonary edema, and decompensation with resultant heart failure; weight gain; swelling or edema; medication-induced neural toxicity;  particulate matter embolism and blood vessel occlusion with resultant organ, and/or nervous system infarction; and/or aseptic necrosis of one or more joints. Finally, the patient was informed that Medicine is not an exact science; therefore, there is also the possibility of unforeseen or unpredictable risks and/or possible complications that may result in a catastrophic outcome. The patient indicated having understood very clearly. We have given the patient no guarantees and we have made no promises. Enough time was given to the patient to ask questions, all of which were answered to the patient's satisfaction. Mr. Hulme has indicated that he wanted to continue with the procedure. Attestation: I, the ordering provider, attest that I have discussed with the patient the benefits, risks, side-effects, alternatives, likelihood of achieving goals, and potential problems during recovery for the procedure that I have provided informed consent. Date  Time: 12/04/2022 10:51 AM   Pre-Procedure Preparation:  Monitoring: As per clinic protocol. Respiration, ETCO2, SpO2, BP, heart rate and rhythm monitor placed and checked for adequate function Safety Precautions: Patient was assessed for positional comfort and pressure points before starting the procedure. Time-out: I initiated and conducted the "Time-out" before starting the procedure, as per protocol. The patient was asked to participate by confirming the accuracy of the "Time Out" information. Verification of the correct person, site, and procedure were performed and confirmed by me, the nursing staff, and the patient. "Time-out" conducted as per Joint Commission's Universal Protocol (UP.01.01.01). Time: 1116 Start Time: 1116 hrs.  Description/Narrative of Procedure:          Target: Epidural space via interlaminar opening, initially targeting the lower laminar border of the superior vertebral body. Region: Lumbar Approach: Percutaneous  paravertebral  Rationale (medical necessity): procedure needed and proper for the diagnosis and/or treatment of  the patient's medical symptoms and needs. Procedural Technique Safety Precautions: Aspiration looking for blood return was conducted prior to all injections. At no point did we inject any substances, as a needle was being advanced. No attempts were made at seeking any paresthesias. Safe injection practices and needle disposal techniques used. Medications properly checked for expiration dates. SDV (single dose vial) medications used. Description of the Procedure: Protocol guidelines were followed. The procedure needle was introduced through the skin, ipsilateral to the reported pain, and advanced to the target area. Bone was contacted and the needle walked caudad, until the lamina was cleared. The epidural space was identified using "loss-of-resistance technique" with 2-3 ml of PF-NaCl (0.9% NSS), in a 5cc LOR glass syringe.  Vitals:   12/04/22 1048 12/04/22 1111 12/04/22 1116 12/04/22 1122  BP: (!) 143/97 (!) 163/92 (!) 152/89 (!) 165/97  Pulse: 85     Resp: 16 18 16 18   Temp: (!) 97.3 F (36.3 C)     TempSrc: Temporal     SpO2: 100% 100% 100% 99%  Weight: 212 lb (96.2 kg)     Height: 6\' 4"  (1.93 m)       Start Time: 1116 hrs. End Time: 1122 hrs.  Imaging Guidance (Spinal):          Type of Imaging Technique: Fluoroscopy Guidance (Spinal) Indication(s): Fluoroscopy guidance for needle placement to enhance accuracy in procedures requiring precise needle localization for targeted delivery of medication in or near specific anatomical locations not easily accessible without such real-time imaging assistance. Exposure Time: Please see nurses notes. Contrast: Before injecting any contrast, we confirmed that the patient did not have an allergy to iodine, shellfish, or radiological contrast. Once satisfactory needle placement was completed at the desired level, radiological contrast was  injected. Contrast injected under live fluoroscopy. No contrast complications. See chart for type and volume of contrast used. Fluoroscopic Guidance: I was personally present during the use of fluoroscopy. "Tunnel Vision Technique" used to obtain the best possible view of the target area. Parallax error corrected before commencing the procedure. "Direction-depth-direction" technique used to introduce the needle under continuous pulsed fluoroscopy. Once target was reached, antero-posterior, oblique, and lateral fluoroscopic projection used confirm needle placement in all planes. Images permanently stored in EMR. Interpretation: I personally interpreted the imaging intraoperatively. Adequate needle placement confirmed in multiple planes. Appropriate spread of contrast into desired area was observed. No evidence of afferent or efferent intravascular uptake. No intrathecal or subarachnoid spread observed. Permanent images saved into the patient's record.  Antibiotic Prophylaxis:   Anti-infectives (From admission, onward)    None      Indication(s): None identified  Post-operative Assessment:  Post-procedure Vital Signs:  Pulse/HCG Rate: 8593 Temp: (!) 97.3 F (36.3 C) Resp: 18 BP: (!) 165/97 SpO2: 99 %  EBL: None  Complications: No immediate post-treatment complications observed by team, or reported by patient.  Note: The patient tolerated the entire procedure well. A repeat set of vitals were taken after the procedure and the patient was kept under observation following institutional policy, for this type of procedure. Post-procedural neurological assessment was performed, showing return to baseline, prior to discharge. The patient was provided with post-procedure discharge instructions, including a section on how to identify potential problems. Should any problems arise concerning this procedure, the patient was given instructions to immediately contact us, at any time, without hesitation.  In any case, we plan to contact the patient by telephone for a follow-up status report regarding this interventional procedure.  Comments:  No additional relevant information.  Plan of Care (POC)  Orders:  Orders Placed This Encounter  Procedures   Lumbar Epidural Injection    Scheduling Instructions:     Procedure: Interlaminar LESI L3-4     Laterality: Right     Sedation: Patient's choice     Timeframe: Today    Order Specific Question:   Where will this procedure be performed?    Answer:   [facility]   DG [facility] C-ARM 1-60 MIN NO REPORT    Intraoperative interpretation by procedural physician at [facility].    Standing Status:   Standing    Number of Occurrences:   1    Order Specific Question:   Reason for exam:    Answer:   Assistance in needle guidance and placement for procedures requiring needle placement in or near specific anatomical locations not easily accessible without such assistance.   Informed Consent Details: Physician/Practitioner Attestation; Transcribe to consent form and obtain patient signature    Note: Always confirm laterality of pain with Mr. Pioli, before procedure. Transcribe to consent form and obtain patient signature.    Order Specific Question:   Physician/Practitioner attestation of informed consent for procedure/surgical case    Answer:   I, the physician/practitioner, attest that I have discussed with the patient the benefits, risks, side effects, alternatives, likelihood of achieving goals and potential problems during recovery for the procedure that I have provided informed consent.    Order Specific Question:   Procedure    Answer:   Lumbar epidural steroid injection under fluoroscopic guidance    Order Specific Question:   Physician/Practitioner performing the procedure    Answer:   Harmony Sandell A. Laban Emperor, MD    Order Specific Question:   Indication/Reason    Answer:   Low back and/or lower extremity pain secondary to  lumbar radiculitis   Care order/instruction: Please confirm that the patient has stopped the Xarelto (Rivaroxaban) x 3 days prior to procedure or surgery.    Please confirm that the patient has stopped the Xarelto (Rivaroxaban) x 3 days prior to procedure or surgery.    Standing Status:   Standing    Number of Occurrences:   1   Provide equipment / supplies at bedside    Procedural tray: Epidural Tray (Disposable  single use) Skin infiltration needle: Regular 1.5-in, 25-G, (x1) Block needle size: Regular standard Catheter: No catheter required    Standing Status:   Standing    Number of Occurrences:   1    Order Specific Question:   Specify    Answer:   Epidural Tray   Bleeding precautions    Standing Status:   Standing    Number of Occurrences:   1   Chronic Opioid Analgesic:  No chronic opioid analgesics therapy prescribed by our practice. Tramadol 50 mg, half a tablet p.o. daily MME/day: 2.5 mg/day   Medications ordered for procedure: Meds ordered this encounter  Medications   iohexol (OMNIPAQUE) 180 MG/ML injection 10 mL    Must be Myelogram-compatible. If not available, you may substitute with a water-soluble, non-ionic, hypoallergenic, myelogram-compatible radiological contrast medium.   lidocaine (XYLOCAINE) 2 % (with pres) injection 400 mg   pentafluoroprop-tetrafluoroeth (GEBAUERS) aerosol   sodium chloride flush (NS) 0.9 % injection 2 mL   ropivacaine (PF) 2 mg/mL (0.2%) (NAROPIN) injection 2 mL   triamcinolone acetonide (KENALOG-40) injection 40 mg   Medications administered: We administered iohexol, lidocaine, pentafluoroprop-tetrafluoroeth, sodium chloride flush, ropivacaine (PF) 2  mg/mL (0.2%), and triamcinolone acetonide.  See the medical record for exact dosing, route, and time of administration.  Follow-up plan:   Return in about 2 weeks (around 12/18/2022) for (VV), (PPE).       Interventional Therapies  Risk  Complexity Considerations:   Xarelto  ANTICOAGULATION: (Stop: 3 days  Restart: 6 hours)   Planned  Pending:   Therapeutic right L3-4 LESI #1 (12/04/2022)    Under consideration:   Diagnostic bilateral (L2-S1) lumbar facet MBB #1  Therapeutic midline L2-3 LESI #1  Therapeutic L2-3, L3-4, and L4-5 MILD procedure   Completed:   Diagnostic/therapeutic left L3 & L4 TESI x1 (09/26/2021) (100/100/100/100 x 2 months) Therapeutic left L5 TFESI x2 (04/25/2021) (100/100/100/90-100)  Therapeutic left L5-S1 LESI x2 (08/30/2022) (100/100/70/ LBP:90  LLEP:100)  Therapeutic left L3-4 LESI x1 (02/16/2020) (90/90/65/65)  Therapeutic right L3-4 LESI x1 (04/25/2021) (100/100/100/90-100)    Therapeutic  Palliative (PRN) options:          Recent Visits Date Type Provider Dept  11/21/22 Office Visit Delano Metz, MD [facility]  09/18/22 Office Visit Delano Metz, MD [facility]  Showing recent visits within past 90 days and meeting all other requirements Today's Visits Date Type Provider Dept  12/04/22 Procedure visit Delano Metz, MD [facility]  Showing today's visits and meeting all other requirements Future Appointments Date Type Provider Dept  12/18/22 Appointment Delano Metz, MD [facility]  Showing future appointments within next 90 days and meeting all other requirements  Disposition: Discharge home  Discharge (Date  Time): 12/04/2022; 1130 hrs.   Primary Care Physician: Rueben Bash, NP Location: [facility] Note by: Oswaldo Done, MD (TTS technology used. I apologize for any typographical errors that were not detected and corrected.) Date: 12/04/2022; Time: 11:32 AM  Disclaimer:  Medicine is not an Visual merchandiser. The only guarantee in medicine is that nothing is guaranteed. It is important to note that the decision to proceed with this intervention was based on the information collected from the patient. The Data and  conclusions were drawn from the patient's questionnaire, the interview, and the physical examination. Because the information was provided in large part by the patient, it cannot be guaranteed that it has not been purposely or unconsciously manipulated. Every effort has been made to obtain as much relevant data as possible for this evaluation. It is important to note that the conclusions that lead to this procedure are derived in large part from the available data. Always take into account that the treatment will also be dependent on availability of resources and existing treatment guidelines, considered by other Pain Management Practitioners as being common knowledge and practice, at the time of the intervention. For Medico-Legal purposes, it is also important to point out that variation in procedural techniques and pharmacological choices are the acceptable norm. The indications, contraindications, technique, and results of the above procedure should only be interpreted and judged by a Board-Certified Interventional Pain Specialist with extensive familiarity and expertise in the same exact procedure and technique.

## 2022-12-04 ENCOUNTER — Encounter: Payer: Self-pay | Admitting: Pain Medicine

## 2022-12-04 ENCOUNTER — Ambulatory Visit
Admission: RE | Admit: 2022-12-04 | Discharge: 2022-12-04 | Disposition: A | Discharge status: Home / Self Care | Payer: Medicare Other | Source: Ambulatory Visit | Attending: Pain Medicine | Admitting: Pain Medicine

## 2022-12-04 ENCOUNTER — Ambulatory Visit: Payer: Medicare Other | Attending: Pain Medicine | Admitting: Pain Medicine

## 2022-12-04 VITALS — BP 165/97 | HR 85 | Temp 97.3°F | Resp 18 | Ht 76.0 in | Wt 212.0 lb

## 2022-12-04 DIAGNOSIS — R937 Abnormal findings on diagnostic imaging of other parts of musculoskeletal system: Secondary | ICD-10-CM | POA: Diagnosis present

## 2022-12-04 DIAGNOSIS — M51362 Other intervertebral disc degeneration, lumbar region with discogenic back pain and lower extremity pain: Secondary | ICD-10-CM | POA: Diagnosis present

## 2022-12-04 DIAGNOSIS — M5136 Other intervertebral disc degeneration, lumbar region with discogenic back pain only: Secondary | ICD-10-CM | POA: Insufficient documentation

## 2022-12-04 DIAGNOSIS — M4696 Unspecified inflammatory spondylopathy, lumbar region: Secondary | ICD-10-CM | POA: Insufficient documentation

## 2022-12-04 DIAGNOSIS — M5441 Lumbago with sciatica, right side: Secondary | ICD-10-CM | POA: Diagnosis present

## 2022-12-04 DIAGNOSIS — G8929 Other chronic pain: Secondary | ICD-10-CM | POA: Insufficient documentation

## 2022-12-04 DIAGNOSIS — M545 Low back pain, unspecified: Secondary | ICD-10-CM | POA: Insufficient documentation

## 2022-12-04 DIAGNOSIS — M5442 Lumbago with sciatica, left side: Secondary | ICD-10-CM | POA: Insufficient documentation

## 2022-12-04 DIAGNOSIS — Z7901 Long term (current) use of anticoagulants: Secondary | ICD-10-CM | POA: Diagnosis present

## 2022-12-04 DIAGNOSIS — M48061 Spinal stenosis, lumbar region without neurogenic claudication: Secondary | ICD-10-CM | POA: Insufficient documentation

## 2022-12-04 DIAGNOSIS — M48062 Spinal stenosis, lumbar region with neurogenic claudication: Secondary | ICD-10-CM | POA: Diagnosis present

## 2022-12-04 MED ORDER — SODIUM CHLORIDE (PF) 0.9 % IJ SOLN
INTRAMUSCULAR | Status: AC
  Filled 2022-12-04: qty 10

## 2022-12-04 MED ORDER — IOHEXOL 180 MG/ML  SOLN
INTRAMUSCULAR | Status: AC
  Filled 2022-12-04: qty 20

## 2022-12-04 MED ORDER — ROPIVACAINE HCL 2 MG/ML IJ SOLN
INTRAMUSCULAR | Status: AC
  Filled 2022-12-04: qty 20

## 2022-12-04 MED ORDER — LIDOCAINE HCL 2 % IJ SOLN
20.0000 mL | Freq: Once | INTRAMUSCULAR | Status: AC
  Administered 2022-12-04: 100 mg

## 2022-12-04 MED ORDER — IOHEXOL 180 MG/ML  SOLN
10.0000 mL | Freq: Once | INTRAMUSCULAR | Status: AC
  Administered 2022-12-04: 10 mL via EPIDURAL

## 2022-12-04 MED ORDER — PENTAFLUOROPROP-TETRAFLUOROETH EX AERO
INHALATION_SPRAY | Freq: Once | CUTANEOUS | Status: AC
  Administered 2022-12-04: 30 via TOPICAL

## 2022-12-04 MED ORDER — LIDOCAINE HCL (PF) 1 % IJ SOLN
INTRAMUSCULAR | Status: AC
  Filled 2022-12-04: qty 10

## 2022-12-04 MED ORDER — TRIAMCINOLONE ACETONIDE 40 MG/ML IJ SUSP
40.0000 mg | Freq: Once | INTRAMUSCULAR | Status: AC
  Administered 2022-12-04: 40 mg

## 2022-12-04 MED ORDER — ROPIVACAINE HCL 2 MG/ML IJ SOLN
2.0000 mL | Freq: Once | INTRAMUSCULAR | Status: AC
  Administered 2022-12-04: 2 mL via EPIDURAL

## 2022-12-04 MED ORDER — SODIUM CHLORIDE 0.9% FLUSH
2.0000 mL | Freq: Once | INTRAVENOUS | Status: AC
  Administered 2022-12-04: 2 mL

## 2022-12-04 MED ORDER — TRIAMCINOLONE ACETONIDE 40 MG/ML IJ SUSP
INTRAMUSCULAR | Status: AC
  Filled 2022-12-04: qty 1

## 2022-12-04 NOTE — Patient Instructions (Addendum)
Epidural Steroid Injection Patient Information  Description: The epidural space surrounds the nerves as they exit the spinal cord.  In some patients, the nerves can be compressed and inflamed by a bulging disc or a tight spinal canal (spinal stenosis).  By injecting steroids into the epidural space, we can bring irritated nerves into direct contact with a potentially helpful medication.  These steroids act directly on the irritated nerves and can reduce swelling and inflammation which often leads to decreased pain.  Epidural steroids may be injected anywhere along the spine and from the neck to the low back depending upon the location of your pain.   After numbing the skin with local anesthetic (like Novocaine), a small needle is passed into the epidural space slowly.  You may experience a sensation of pressure while this is being done.  The entire block usually last less than 10 minutes.  Conditions which may be treated by epidural steroids:  Low back and leg pain Neck and arm pain Spinal stenosis Post-laminectomy syndrome Herpes zoster (shingles) pain Pain from compression fractures  Preparation for the injection:  Do not eat any solid food or dairy products within 8 hours of your appointment.  You may drink clear liquids up to 3 hours before appointment.  Clear liquids include water, black coffee, juice or soda.  No milk or cream please. You may take your regular medication, including pain medications, with a sip of water before your appointment  Diabetics should hold regular insulin (if taken separately) and take 1/2 normal NPH dos the morning of the procedure.  Carry some sugar containing items with you to your appointment. A driver must accompany you and be prepared to drive you home after your procedure.  Bring all your current medications with your. An IV may be inserted and sedation may be given at the discretion of the physician.   A blood pressure cuff, EKG and other monitors will  often be applied during the procedure.  Some patients may need to have extra oxygen administered for a short period. You will be asked to provide medical information, including your allergies, prior to the procedure.  We must know immediately if you are taking blood thinners (like Coumadin/Warfarin)  Or if you are allergic to IV iodine contrast (dye). We must know if you could possible be pregnant.  Possible side-effects: Bleeding from needle site Infection (rare, may require surgery) Nerve injury (rare) Numbness & tingling (temporary) Difficulty urinating (rare, temporary) Spinal headache ( a headache worse with upright posture) Light -headedness (temporary) Pain at injection site (several days) Decreased blood pressure (temporary) Weakness in arm/leg (temporary) Pressure sensation in back/neck (temporary)  Call if you experience: Fever/chills associated with headache or increased back/neck pain. Headache worsened by an upright position. New onset weakness or numbness of an extremity below the injection site Hives or difficulty breathing (go to the emergency room) Inflammation or drainage at the infection site Severe back/neck pain Any new symptoms which are concerning to you  Please note:  Although the local anesthetic injected can often make your back or neck feel good for several hours after the injection, the pain will likely return.  It takes 3-7 days for steroids to work in the epidural space.  You may not notice any pain relief for at least that one week.  If effective, we will often do a series of three injections spaced 3-6 weeks apart to maximally decrease your pain.  After the initial series, we generally will wait several months before  considering a repeat injection of the same type.  If you have any questions, please call [phone] [facility] ______________________________________________________________________    Post-Procedure  Discharge Instructions  Instructions: Apply ice:  Purpose: This will minimize any swelling and discomfort after procedure.  When: Day of procedure, as soon as you get home. How: Fill a plastic sandwich bag with crushed ice. Cover it with a small towel and apply to injection site. How long: (15 min on, 15 min off) Apply for 15 minutes then remove x 15 minutes.  Repeat sequence on day of procedure, until you go to bed. Apply heat:  Purpose: To treat any soreness and discomfort from the procedure. When: Starting the next day after the procedure. How: Apply heat to procedure site starting the day following the procedure. How long: May continue to repeat daily, until discomfort goes away. Food intake: Start with clear liquids (like water) and advance to regular food, as tolerated.  Physical activities: Keep activities to a minimum for the first 8 hours after the procedure. After that, then as tolerated. Driving: If you have received any sedation, be responsible and do not drive. You are not allowed to drive for 24 hours after having sedation. Blood thinner: (Applies only to those taking blood thinners) You may restart your blood thinner 6 hours after your procedure. Insulin: (Applies only to Diabetic patients taking insulin) As soon as you can eat, you may resume your normal dosing schedule. Infection prevention: Keep procedure site clean and dry. Shower daily and clean area with soap and water. Post-procedure Pain Diary: Extremely important that this be done correctly and accurately. Recorded information will be used to determine the next step in treatment. For the purpose of accuracy, follow these rules: Evaluate only the area treated. Do not report or include pain from an untreated area. For the purpose of this evaluation, ignore all other areas of pain, except for the treated area. After your procedure, avoid taking a long nap and attempting to complete the pain diary after you wake up. Instead,  set your alarm clock to go off every hour, on the hour, for the initial 8 hours after the procedure. Document the duration of the numbing medicine, and the relief you are getting from it. Do not go to sleep and attempt to complete it later. It will not be accurate. If you received sedation, it is likely that you were given a medication that may cause amnesia. Because of this, completing the diary at a later time may cause the information to be inaccurate. This information is needed to plan your care. Follow-up appointment: Keep your post-procedure follow-up evaluation appointment after the procedure (usually 2 weeks for most procedures, 6 weeks for radiofrequencies). DO NOT FORGET to bring you pain diary with you.   Expect: (What should I expect to see with my procedure?) From numbing medicine (AKA: Local Anesthetics): Numbness or decrease in pain. You may also experience some weakness, which if present, could last for the duration of the local anesthetic. Onset: Full effect within 15 minutes of injected. Duration: It will depend on the type of local anesthetic used. On the average, 1 to 8 hours.  From steroids (Applies only if steroids were used): Decrease in swelling or inflammation. Once inflammation is improved, relief of the pain will follow. Onset of benefits: Depends on the amount of swelling present. The more swelling, the longer it will take for the benefits to be seen. In some cases, up to 10  days. Duration: Steroids will stay in the system x 2 weeks. Duration of benefits will depend on multiple posibilities including persistent irritating factors. Side-effects: If present, they may typically last 2 weeks (the duration of the steroids). Frequent: Cramps (if they occur, drink Gatorade and take over-the-counter Magnesium 450-500 mg once to twice a day); water retention with temporary weight gain; increases in blood sugar; decreased immune system response; increased appetite. Occasional: Facial  flushing (red, warm cheeks); mood swings; menstrual changes. Uncommon: Long-term decrease or suppression of natural hormones; bone thinning. (These are more common with higher doses or more frequent use. This is why we prefer that our patients avoid having any injection therapies in other practices.)  Very Rare: Severe mood changes; psychosis; aseptic necrosis. From procedure: Some discomfort is to be expected once the numbing medicine wears off. This should be minimal if ice and heat are applied as instructed.  Call if: (When should I call?) You experience numbness and weakness that gets worse with time, as opposed to wearing off. New onset bowel or bladder incontinence. (Applies only to procedures done in the spine)  Emergency Numbers: Durning business hours (Monday - Thursday, 8:00 AM - 4:00 PM) (Friday, 9:00 AM - 12:00 Noon): [phone] After hours: [phone] NOTE: If you are having a problem and are unable connect with, or to talk to a provider, then go to your nearest urgent care or emergency department. If the problem is serious and urgent, please call 911. ______________________________________________________________________      ______________________________________________________________________    Blood Thinners  IMPORTANT NOTICE:  If you take any of these, make sure to notify the nursing staff.  Failure to do so may result in serious injury.  Recommended time intervals to stop and restart blood-thinners, before & after invasive procedures  Generic Name Brand Name Pre-procedure: Stop medication for this amount of time before your procedure: Post-procedure: Wait this amount of time after the procedure before restarting your medication:  Abciximab Reopro 15 days 2 hrs  Alteplase Activase 10 days 10 days  Anagrelide Agrylin    Apixaban Eliquis 3 days 6 hrs  Cilostazol Pletal 3 days 5 hrs  Clopidogrel Plavix 7-10 days 2 hrs  Dabigatran Pradaxa 5 days 6 hrs   Dalteparin Fragmin 24 hours 4 hrs  Dipyridamole Aggrenox 11days 2 hrs  Edoxaban Lixiana; Savaysa 3 days 2 hrs  Enoxaparin  Lovenox 24 hours 4 hrs  Eptifibatide Integrillin 8 hours 2 hrs  Fondaparinux  Arixtra 72 hours 12 hrs  Hydroxychloroquine Plaquenil 11 days   Prasugrel Effient 7-10 days 6 hrs  Reteplase Retavase 10 days 10 days  Rivaroxaban Xarelto 3 days 6 hrs  Ticagrelor Brilinta 5-7 days 6 hrs  Ticlopidine Ticlid 10-14 days 2 hrs  Tinzaparin Innohep 24 hours 4 hrs  Tirofiban Aggrastat 8 hours 2 hrs  Warfarin Coumadin 5 days 2 hrs   Other medications with blood-thinning effects  Product indications Generic (Brand) names Note  Cholesterol Lipitor Stop 4 days before procedure  Blood thinner (injectable) Heparin (LMW or LMWH Heparin) Stop 24 hours before procedure  Cancer Ibrutinib (Imbruvica) Stop 7 days before procedure  Malaria/Rheumatoid Hydroxychloroquine (Plaquenil) Stop 11 days before procedure  Thrombolytics  10 days before or after procedures   Over-the-counter (OTC) Products with blood-thinning effects  Product Common names Stop Time  Aspirin > 325 mg Goody Powders, Excedrin, etc. 11 days  Aspirin <= 81 mg  7 days  Fish oil  4 days  Garlic supplements  7 days  Ginkgo  biloba  36 hours  Ginseng  24 hours  NSAIDs Ibuprofen, Naprosyn, etc. 3 days  Vitamin E  4 days   ______________________________________________________________________

## 2022-12-05 ENCOUNTER — Telehealth: Payer: Self-pay

## 2022-12-05 NOTE — Telephone Encounter (Signed)
Post procedure follow up. Patient states he is doing ok.  

## 2022-12-17 ENCOUNTER — Encounter: Payer: Self-pay | Admitting: Pain Medicine

## 2022-12-17 ENCOUNTER — Telehealth: Payer: Self-pay

## 2022-12-17 NOTE — Progress Notes (Signed)
Discuss with patient PPE and pre-appointment questions for his telephone visit with Dr. Shireen Quan.   Patient received 100% relief long-term from the procedure and is wanting to have his left side back done as soon as possible.

## 2022-12-17 NOTE — Telephone Encounter (Signed)
Attempt to call patient for pre-appointment VV nursing questions. No answer and left voicemail.

## 2022-12-18 ENCOUNTER — Ambulatory Visit: Payer: Medicare Other | Attending: Pain Medicine | Admitting: Pain Medicine

## 2022-12-18 DIAGNOSIS — M5442 Lumbago with sciatica, left side: Secondary | ICD-10-CM | POA: Diagnosis not present

## 2022-12-18 DIAGNOSIS — M5441 Lumbago with sciatica, right side: Secondary | ICD-10-CM | POA: Diagnosis not present

## 2022-12-18 DIAGNOSIS — Z7901 Long term (current) use of anticoagulants: Secondary | ICD-10-CM

## 2022-12-18 DIAGNOSIS — M4316 Spondylolisthesis, lumbar region: Secondary | ICD-10-CM

## 2022-12-18 DIAGNOSIS — M5136 Other intervertebral disc degeneration, lumbar region with discogenic back pain only: Secondary | ICD-10-CM

## 2022-12-18 DIAGNOSIS — M79605 Pain in left leg: Secondary | ICD-10-CM

## 2022-12-18 DIAGNOSIS — Z09 Encounter for follow-up examination after completed treatment for conditions other than malignant neoplasm: Secondary | ICD-10-CM

## 2022-12-18 DIAGNOSIS — G8929 Other chronic pain: Secondary | ICD-10-CM

## 2022-12-18 DIAGNOSIS — M4696 Unspecified inflammatory spondylopathy, lumbar region: Secondary | ICD-10-CM | POA: Diagnosis not present

## 2022-12-18 DIAGNOSIS — M5417 Radiculopathy, lumbosacral region: Secondary | ICD-10-CM

## 2022-12-18 DIAGNOSIS — M545 Low back pain, unspecified: Secondary | ICD-10-CM

## 2022-12-18 NOTE — Progress Notes (Signed)
Patient: Todd Bradford  Service Category: E/M  Provider: Oswaldo Done, MD  DOB: 19-May-1950  DOS: 12/18/2022  Location: Office  MRN: [id number]  Setting: Ambulatory outpatient  Referring Provider: Rueben Bash, NP  Type: Established Patient  Specialty: Interventional Pain Management  PCP: Rueben Bash, NP  Location: Remote location  Delivery: TeleHealth     Virtual Encounter - Pain Management PROVIDER NOTE: Information contained herein reflects review and annotations entered in association with encounter. Interpretation of such information and data should be left to medically-trained personnel. Information provided to patient can be located elsewhere in the medical record under "Patient Instructions". Document created using STT-dictation technology, any transcriptional errors that may result from process are unintentional.    Contact & Pharmacy Preferred: [phone] Home: [phone] (home) Mobile: [phone] (mobile) E-mail: [email or web address]  [facility] - [place], [place] - [address] [place] [address] Phone: [phone] Fax: [phone]   Pre-screening  Mr. Debock offered "in-person" vs "virtual" encounter. He indicated preferring virtual for this encounter.   Reason COVID-19*  Social distancing based on CDC and AMA recommendations.   I contacted Darius Bump on 12/18/2022 via telephone.      I clearly identified myself as Oswaldo Done, MD. I verified that I was speaking with the correct person using two identifiers (Name: Deondrick Haseley, and date of birth: 1950/07/30).  Consent I sought verbal advanced consent from Darius Bump for virtual visit interactions. I informed Mr. Bireley of possible security and privacy concerns, risks, and limitations associated with providing "not-in-person" medical evaluation and management services. I also informed Mr. Kozlowski of the availability of "in-person" appointments.  Finally, I informed him that there would be a charge for the virtual visit and that he could be  personally, fully or partially, financially responsible for it. Mr. Machon expressed understanding and agreed to proceed.   Historic Elements   Mr. Tommas Suiter is a 72 y.o. year old, male patient evaluated today after our last contact on 12/04/2022. Mr. Brich  has a past medical history of Hyperlipidemia. He also  has a past surgical history that includes Neck surgery (2000 approx); Spine surgery; and Prostate surgery (04/2022). Mr. Downham has a current medication list which includes the following prescription(s): vitamin c, atorvastatin, cyclobenzaprine, finasteride, ibuprofen, rivaroxaban, tramadol, and triamcinolone ointment. He  reports that he quit smoking about 17 years ago. His smoking use included cigarettes. He started smoking about 47 years ago. He has a 60 pack-year smoking history. He has never used smokeless tobacco. No history on file for alcohol use and drug use. Mr. Ravenscraft has no known allergies.  BMI: Estimated body mass index is 25.81 kg/m as calculated from the following:   Height as of 12/04/22: 6\' 4"  (1.93 m).   Weight as of 12/04/22: 212 lb (96.2 kg). Last encounter: 11/21/2022. Last procedure: 12/04/2022.  HPI  Today, he is being contacted for a post-procedure assessment. Patient indicates having attained 100% relief of the pain for the duration of local anesthetic which actually continued and he is still having an ongoing 100% relief of the pain on the right side.  The right leg pain that he was experiencing is gone but unfortunately as he returned from his trip to [place] he indicates that he started having pain now on the opposite side that it is just as bad as it was on the right side.  He indicates that he is left leg pain is now going  all the way down into his foot and the exact same distribution as he was on the right side.  He states that when he sits down he has absolutely no pain  but as soon as he stands up and begins to walk the pain starts in his lower back and goes all the way down to his leg and feet.  He wants to proceed with a second injection but this time on the left side.  Post-procedure evaluation   Type: Lumbar epidural steroid injection (LESI) (interlaminar) #1    Laterality: Right   Level:  L3-4 Level.  Imaging: Fluoroscopic guidance Spinal ([id number]) Anesthesia: Local anesthesia (1-2% Lidocaine) Anxiolysis: None                 Sedation: No Sedation                       DOS: 12/04/2022  Performed by: Oswaldo Done, MD  Purpose: Diagnostic/Therapeutic Indications: Lumbar radicular pain of intraspinal etiology of more than 4 weeks that has failed to respond to conservative therapy and is severe enough to impact quality of life or function. 1. Inflammatory spondylopathy of lumbar region [facility])  (Right: L3-4)   2. 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 Lumbar lateral recess stenosis  (Right: L3-4)   4. 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 Degeneration of intervertebral disc of lumbar region with discogenic back pain and lower extremity pain   8. Abnormal MRI, lumbar spine (09/12/2021 & 01/16/2020)   9. Chronic anticoagulation (Xarelto)    NAS-11 Pain score:   Pre-procedure: 6 /10   Post-procedure: 6 /10     Effectiveness:  Initial hour after procedure: 100 %. Subsequent 4-6 hours post-procedure: 100 %. Analgesia past initial 6 hours: 100 %. Ongoing improvement:  Analgesic: Patient indicates having attained 100% relief of the pain for the duration of local anesthetic which actually continued and he is still having an ongoing 100% relief of the pain on the right side.  The right leg pain that he was experiencing is gone but unfortunately as he returned from his trip to  [place] he indicates that he started having pain now on the opposite side that it is just as bad as it was on the right side. Function: Mr. Carnahan reports improvement in function ROM: Mr. Sant reports improvement in ROM  Pharmacotherapy Assessment   Opioid Analgesic:  No chronic opioid analgesics therapy prescribed by our practice. Tramadol 50 mg, half a tablet p.o. daily MME/day: 2.5 mg/day   Monitoring: [place] PMP: PDMP reviewed during this encounter.       Pharmacotherapy: No side-effects or adverse reactions reported. Compliance: No problems identified. Effectiveness: Clinically acceptable. Plan: Refer to "POC". UDS:  Summary  Date Value Ref Range Status  02/16/2020 FINAL  Final    Comment:    ==================================================================== TOXASSURE COMP DRUG ANALYSIS,UR ==================================================================== Test                             Result       Flag       Units Drug Present and Declared for Prescription Verification   Tramadol  5215         EXPECTED   ng/mg creat   O-Desmethyltramadol            6226         EXPECTED   ng/mg creat   N-Desmethyltramadol            1269         EXPECTED   ng/mg creat    Source of tramadol is a prescription medication.    O-desmethyltramadol and N-desmethyltramadol are expected    metabolites of tramadol.    Cyclobenzaprine                PRESENT      EXPECTED   Desmethylcyclobenzaprine       PRESENT      EXPECTED    Desmethylcyclobenzaprine is an expected metabolite of    cyclobenzaprine.  Drug Present not Declared for Prescription Verification   Ephedrine/Pseudoephedrine      PRESENT      UNEXPECTED   Phenylpropanolamine            PRESENT      UNEXPECTED    Source of ephedrine/pseudoephedrine is most commonly    pseudoephedrine in over-the-counter or prescription cold and    allergy medications. Phenylpropanolamine is an expected    metabolite of  ephedrine/pseudoephedrine.    Orphenadrine                   PRESENT      UNEXPECTED   Ibuprofen                      PRESENT      UNEXPECTED   Diphenhydramine                PRESENT      UNEXPECTED ==================================================================== Test                      Result    Flag   Units      Ref Range   Creatinine              74               mg/dL      >=10 ==================================================================== Declared Medications:  The flagging and interpretation on this report are based on the  following declared medications.  Unexpected results may arise from  inaccuracies in the declared medications.   **Note: The testing scope of this panel includes these medications:   Cyclobenzaprine (Flexeril)  Tramadol (Ultram)   **Note: The testing scope of this panel does not include following  reported medications:   Atorvastatin (Lipitor)  Finasteride (Proscar)  Tamsulosin (Flomax) ==================================================================== For clinical consultation, please call [phone]. ====================================================================    No results found for: "CBDTHCR", "D8THCCBX", "D9THCCBX"   Laboratory Chemistry Profile   Renal Lab Results  Component Value Date   BUN 26 (H) 03/01/2020   CREATININE 1.24 03/01/2020   BCR 18 02/15/2020   GFRAA 64 02/15/2020   GFRNONAA >60 03/01/2020    Hepatic Lab Results  Component Value Date   AST 45 (H) 03/01/2020   ALT 77 (H) 03/01/2020   ALBUMIN 3.6 03/01/2020   ALKPHOS 161 (H) 03/01/2020    Electrolytes Lab Results  Component Value Date   NA 136 03/01/2020   K 4.3 03/01/2020   CL 100 03/01/2020   CALCIUM 8.9 03/01/2020   MG 2.3 02/15/2020    Bone Lab Results  Component  Value Date   25OHVITD1 37 02/15/2020   25OHVITD2 <1.0 02/15/2020   25OHVITD3 37 02/15/2020    Inflammation (CRP: Acute Phase) (ESR: Chronic Phase) Lab Results   Component Value Date   CRP 9 02/15/2020   ESRSEDRATE 19 02/15/2020         Note: Above Lab results reviewed.  Imaging  DG PAIN CLINIC C-ARM 1-60 MIN NO REPORT Fluoro was used, but no Radiologist interpretation will be provided.  Please refer to "NOTES" tab for provider progress note.  Assessment  The primary encounter diagnosis was Inflammatory spondylopathy of lumbar region [facility])  (Right: L3-4). 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  Plan of Care  Problem-specific:  No problem-specific Assessment & Plan notes found for this encounter.  Mr. Ambus Bocock is not on any long-term medications.  Pharmacotherapy (Medications Ordered): No orders of the defined types were placed in this encounter.  Orders:  Orders Placed This Encounter  Procedures   Lumbar Epidural Injection    Standing Status:   Future    Expiration Date:   03/18/2023    Scheduling Instructions:     Procedure: Interlaminar Lumbar Epidural Steroid injection (LESI)  L3-4     Laterality: Left-sided     Sedation: Patient's choice.     Timeframe: ASAA    Where will this procedure be performed?:   [facility]   Nursing Instructions:    Please complete this patient's postprocedure evaluation.    Scheduling Instructions:     Please complete this patient's postprocedure evaluation.   Blood Thinner Instructions to Nursing    Always make sure patient has clearance from prescribing physician to stop blood thinners for interventional therapies. If the patient requires a Lovenox-bridge therapy, make sure arrangements are  made to institute it with the assistance of the PCP.    Scheduling Instructions:     Have Mr. Redhouse stop the Xarelto (Rivaroxaban) x 3 days prior to procedure or surgery.   Follow-up plan:   Return for [facility]): (L) L3-4 LESI #2, (Blood Thinner Protocol).      Interventional Therapies  Risk  Complexity Considerations:   Xarelto ANTICOAGULATION: (Stop: 3 days  Restart: 6 hours)   Planned  Pending:   Therapeutic left L3-4 LESI #2    Under consideration:   Diagnostic bilateral (L2-S1) lumbar facet MBB #1  Therapeutic midline L2-3 LESI #1  Therapeutic L2-3, L3-4, and L4-5 MILD procedure   Completed:   Therapeutic right L3-4 LESI #1 (12/04/2022) (100/100/100/100 for the right lower extremity pain that he was experiencing) Diagnostic/therapeutic left L3 & L4 TESI x1 (09/26/2021) (100/100/100/100 x 2 months) Therapeutic left L5 TFESI x2 (04/25/2021) (100/100/100/90-100)  Therapeutic left L5-S1 LESI x2 (08/30/2022) (100/100/70/ LBP:90  LLEP:100)  Therapeutic left L3-4 LESI x1 (02/16/2020) (90/90/65/65)  Therapeutic right L3-4 LESI x1 (04/25/2021) (100/100/100/90-100)    Therapeutic  Palliative (PRN) options:        Recent Visits Date Type Provider Dept  12/04/22 Procedure visit Delano Metz, MD [facility]  11/21/22 Office Visit Delano Metz, MD [facility]  Showing recent visits within past 90 days and meeting all other requirements Today's Visits Date Type Provider Dept  12/18/22  Office Visit Delano Metz, MD [facility]  Showing today's visits and meeting all other requirements Future Appointments No visits were found meeting these conditions. Showing future appointments within next 90 days and meeting all other requirements  I discussed the assessment and treatment plan with the patient. The patient was provided an opportunity to ask questions and all were answered. The patient agreed with the plan and demonstrated an  understanding of the instructions.  Patient advised to call back or seek an in-person evaluation if the symptoms or condition worsens.  Duration of encounter: 13 minutes.  Note by: Oswaldo Done, MD Date: 12/18/2022; Time: 3:41 PM

## 2022-12-18 NOTE — Patient Instructions (Signed)
 ______________________________________________________________________    Procedure instructions  Stop blood-thinners  Do not eat or drink fluids (other than water) for 6 hours before your procedure  No water for 2 hours before your procedure  Take your blood pressure medicine with a sip of water  Arrive 30 minutes before your appointment  If sedation is planned, bring suitable driver. Pennie Banter, Benedetto Goad, & public transportation are NOT APPROVED)  Carefully read the "Preparing for your procedure" detailed instructions  If you have questions call us at [phone]  Procedure appointments are for procedures only. NO medication refills or new problem evaluations.   ______________________________________________________________________      ______________________________________________________________________    Preparing for your procedure  Appointments: If you think you may not be able to keep your appointment, call 24-48 hours in advance to cancel. We need time to make it available to others.  Procedure visits are for procedures only. During your procedure appointment there will be: NO Prescription Refills*. NO medication changes or discussions*. NO discussion of disability issues*. NO unrelated pain problem evaluations*. NO evaluations to order other pain procedures*. *These will be addressed at a separate and distinct evaluation encounter on the provider's evaluation schedule and not during procedure days.  Instructions: Food intake: Avoid eating anything solid for at least 8 hours prior to your procedure. Clear liquid intake: You may take clear liquids such as water up to 2 hours prior to your procedure. (No carbonated drinks. No soda.) Transportation: Unless otherwise stated by your physician, bring a driver. (Driver cannot be a Market researcher, Pharmacist, community, or any other form of public transportation.) Morning Medicines: Except for blood thinners, take all of your other morning  medications with a sip of water. Make sure to take your heart and blood pressure medicines. If your blood pressure's lower number is above 100, the case will be rescheduled. Blood thinners: Make sure to stop your blood thinners as instructed.  If you take a blood thinner, but were not instructed to stop it, call our office [phone] and ask to talk to a nurse. Not stopping a blood thinner prior to certain procedures could lead to serious complications. Diabetics on insulin: Notify the staff so that you can be scheduled 1st case in the morning. If your diabetes requires high dose insulin, take only  of your normal insulin dose the morning of the procedure and notify the staff that you have done so. Preventing infections: Shower with an antibacterial soap the morning of your procedure.  Build-up your immune system: Take 1000 mg of Vitamin C with every meal (3 times a day) the day prior to your procedure. Antibiotics: Inform the nursing staff if you are taking any antibiotics or if you have any conditions that may require antibiotics prior to procedures. (Example: recent joint implants)   Pregnancy: If you are pregnant make sure to notify the nursing staff. Not doing so may result in injury to the fetus, including death.  Sickness: If you have a cold, fever, or any active infections, call and cancel or reschedule your procedure. Receiving steroids while having an infection may result in complications. Arrival: You must be in the facility at least 30 minutes prior to your scheduled procedure. Tardiness: Your scheduled time is also the cutoff time. If you do not arrive at least 15 minutes prior to your procedure, you will be rescheduled.  Children: Do not bring any children with you. Make arrangements to keep them home. Dress appropriately: There is always a possibility that your clothing may get  soiled. Avoid long dresses. Valuables: Do not bring any jewelry or valuables.  Reasons to call and  reschedule or cancel your procedure: (Following these recommendations will minimize the risk of a serious complication.) Surgeries: Avoid having procedures within 2 weeks of any surgery. (Avoid for 2 weeks before or after any surgery). Flu Shots: Avoid having procedures within 2 weeks of a flu shots or . (Avoid for 2 weeks before or after immunizations). Barium: Avoid having a procedure within 7-10 days after having had a radiological study involving the use of radiological contrast. (Myelograms, Barium swallow or enema study). Heart attacks: Avoid any elective procedures or surgeries for the initial 6 months after a "Myocardial Infarction" (Heart Attack). Blood thinners: It is imperative that you stop these medications before procedures. Let us know if you if you take any blood thinner.  Infection: Avoid procedures during or within two weeks of an infection (including chest colds or gastrointestinal problems). Symptoms associated with infections include: Localized redness, fever, chills, night sweats or profuse sweating, burning sensation when voiding, cough, congestion, stuffiness, runny nose, sore throat, diarrhea, nausea, vomiting, cold or Flu symptoms, recent or current infections. It is specially important if the infection is over the area that we intend to treat. Heart and lung problems: Symptoms that may suggest an active cardiopulmonary problem include: cough, chest pain, breathing difficulties or shortness of breath, dizziness, ankle swelling, uncontrolled high or unusually low blood pressure, and/or palpitations. If you are experiencing any of these symptoms, cancel your procedure and contact your primary care physician for an evaluation.  Remember:  Regular Business hours are:  Monday to Thursday 8:00 AM to 4:00 PM  Provider's Schedule: Delano Metz, MD:  Procedure days: Tuesday and Thursday 7:30 AM to 4:00 PM  Edward Jolly, MD:  Procedure days: Monday and Wednesday 7:30 AM to 4:00  PM Last  Updated: 12/11/2022 ______________________________________________________________________      ______________________________________________________________________    General Risks and Possible Complications  Patient Responsibilities: It is important that you read this as it is part of your informed consent. It is our duty to inform you of the risks and possible complications associated with treatments offered to you. It is your responsibility as a patient to read this and to ask questions about anything that is not clear or that you believe was not covered in this document.  Patient's Rights: You have the right to refuse treatment. You also have the right to change your mind, even after initially having agreed to have the treatment done. However, under this last option, if you wait until the last second to change your mind, you may be charged for the materials used up to that point.  Introduction: Medicine is not an Visual merchandiser. Everything in Medicine, including the lack of treatment(s), carries the potential for danger, harm, or loss (which is by definition: Risk). In Medicine, a complication is a secondary problem, condition, or disease that can aggravate an already existing one. All treatments carry the risk of possible complications. The fact that a side effects or complications occurs, does not imply that the treatment was conducted incorrectly. It must be clearly understood that these can happen even when everything is done following the highest safety standards.  No treatment: You can choose not to proceed with the proposed treatment alternative. The "PRO(s)" would include: avoiding the risk of complications associated with the therapy. The "CON(s)" would include: not getting any of the treatment benefits. These benefits fall under one of three categories: diagnostic; therapeutic; and/or  palliative. Diagnostic benefits include: getting information which can ultimately lead to  improvement of the disease or symptom(s). Therapeutic benefits are those associated with the successful treatment of the disease. Finally, palliative benefits are those related to the decrease of the primary symptoms, without necessarily curing the condition (example: decreasing the pain from a flare-up of a chronic condition, such as incurable terminal cancer).  General Risks and Complications: These are associated to most interventional treatments. They can occur alone, or in combination. They fall under one of the following six (6) categories: no benefit or worsening of symptoms; bleeding; infection; nerve damage; allergic reactions; and/or death. No benefits or worsening of symptoms: In Medicine there are no guarantees, only probabilities. No healthcare provider can ever guarantee that a medical treatment will work, they can only state the probability that it may. Furthermore, there is always the possibility that the condition may worsen, either directly, or indirectly, as a consequence of the treatment. Bleeding: This is more common if the patient is taking a blood thinner, either prescription or over the counter (example: Goody Powders, Fish oil, Aspirin, Garlic, etc.), or if suffering a condition associated with impaired coagulation (example: Hemophilia, cirrhosis of the liver, low platelet counts, etc.). However, even if you do not have one on these, it can still happen. If you have any of these conditions, or take one of these drugs, make sure to notify your treating physician. Infection: This is more common in patients with a compromised immune system, either due to disease (example: diabetes, cancer, human immunodeficiency virus [HIV], etc.), or due to medications or treatments (example: therapies used to treat cancer and rheumatological diseases). However, even if you do not have one on these, it can still happen. If you have any of these conditions, or take one of these drugs, make sure to notify  your treating physician. Nerve Damage: This is more common when the treatment is an invasive one, but it can also happen with the use of medications, such as those used in the treatment of cancer. The damage can occur to small secondary nerves, or to large primary ones, such as those in the spinal cord and brain. This damage may be temporary or permanent and it may lead to impairments that can range from temporary numbness to permanent paralysis and/or brain death. Allergic Reactions: Any time a substance or material comes in contact with our body, there is the possibility of an allergic reaction. These can range from a mild skin rash (contact dermatitis) to a severe systemic reaction (anaphylactic reaction), which can result in death. Death: In general, any medical intervention can result in death, most of the time due to an unforeseen complication. ______________________________________________________________________      ______________________________________________________________________    Blood Thinners  IMPORTANT NOTICE:  If you take any of these, make sure to notify the nursing staff.  Failure to do so may result in serious injury.  Recommended time intervals to stop and restart blood-thinners, before & after invasive procedures  Generic Name Brand Name Pre-procedure: Stop medication for this amount of time before your procedure: Post-procedure: Wait this amount of time after the procedure before restarting your medication:  Abciximab Reopro 15 days 2 hrs  Alteplase Activase 10 days 10 days  Anagrelide Agrylin    Apixaban Eliquis 3 days 6 hrs  Cilostazol Pletal 3 days 5 hrs  Clopidogrel Plavix 7-10 days 2 hrs  Dabigatran Pradaxa 5 days 6 hrs  Dalteparin Fragmin 24 hours 4 hrs  Dipyridamole Aggrenox 11days 2  hrs  Edoxaban [place] 3 days 2 hrs  Enoxaparin  Lovenox 24 hours 4 hrs  Eptifibatide Integrillin 8 hours 2 hrs  Fondaparinux  Arixtra 72 hours 12 hrs   Hydroxychloroquine Plaquenil 11 days   Prasugrel Effient 7-10 days 6 hrs  Reteplase Retavase 10 days 10 days  Rivaroxaban Xarelto 3 days 6 hrs  Ticagrelor Brilinta 5-7 days 6 hrs  Ticlopidine Ticlid 10-14 days 2 hrs  Tinzaparin Innohep 24 hours 4 hrs  Tirofiban Aggrastat 8 hours 2 hrs  Warfarin Coumadin 5 days 2 hrs   Other medications with blood-thinning effects  Product indications Generic (Brand) names Note  Cholesterol Lipitor Stop 4 days before procedure  Blood thinner (injectable) Heparin (LMW or LMWH Heparin) Stop 24 hours before procedure  Cancer Ibrutinib (Imbruvica) Stop 7 days before procedure  Malaria/Rheumatoid Hydroxychloroquine (Plaquenil) Stop 11 days before procedure  Thrombolytics  10 days before or after procedures   Over-the-counter (OTC) Products with blood-thinning effects  Product Common names Stop Time  Aspirin > 325 mg Goody Powders, Excedrin, etc. 11 days  Aspirin <= 81 mg  7 days  Fish oil  4 days  Garlic supplements  7 days  Ginkgo biloba  36 hours  Ginseng  24 hours  NSAIDs Ibuprofen, Naprosyn, etc. 3 days  Vitamin E  4 days   ______________________________________________________________________

## 2022-12-19 NOTE — Progress Notes (Unsigned)
PROVIDER NOTE: Interpretation of information contained herein should be left to medically-trained personnel. Specific patient instructions are provided elsewhere under "Patient Instructions" section of medical record. This document was created in part using STT-dictation technology, any transcriptional errors that may result from this process are unintentional.  Patient: Todd Bradford Type: Established DOB: 1950/10/24 MRN: [id number] PCP: Rueben Bash, NP  Service: Procedure DOS: 12/20/2022 Setting: Ambulatory Location: Ambulatory outpatient facility Delivery: Face-to-face Provider: Oswaldo Done, MD Specialty: Interventional Pain Management Specialty designation: 09 Location: Outpatient facility Ref. Prov.: Rueben Bash, NP       Interventional Therapy   Type: Lumbar epidural steroid injection (LESI) (interlaminar) #2    Laterality: Left   Level:  L3-4 Level.  Imaging: Fluoroscopic guidance Spinal ([id number]) Anesthesia: Local anesthesia (1-2% Lidocaine) Anxiolysis: None                 Sedation: No Sedation                       DOS: 12/20/2022  Performed by: Oswaldo Done, MD  Purpose: Diagnostic/Therapeutic Indications: Lumbar radicular pain of intraspinal etiology of more than 4 weeks that has failed to respond to conservative therapy and is severe enough to impact quality of life or function. 1. 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 Chronic low back pain (Left) w/ sciatica (Bilateral)   3. Chronic low back pain (Left) w/o sciatica   4. Chronic lower extremity pain (2ry area of Pain) (Left)   5. Degeneration of intervertebral disc of lumbar region with discogenic back pain and lower extremity pain   6. Grade 1 (3 mm) Anterolisthesis of lumbar spine L4/L5   7. Inflammatory spondylopathy of lumbar region [facility])  (Right: L3-4)   8. 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 Lumbar foraminal stenosis (Bilateral: L3-4, L4-5) (Left: L5-S1)   11. Lumbar lateral recess stenosis  (Right: L3-4)   12. Lumbosacral radiculopathy at L5 (Bilateral)   13. Lumbosacral radiculopathy at S1 (Bilateral)    NAS-11 Pain score:   Pre-procedure: 8 /10   Post-procedure: 8 /10      Position / Prep / Materials:  Position: Prone w/ head of the table raised (slight reverse trendelenburg) to facilitate breathing.  Prep solution: ChloraPrep (2% chlorhexidine gluconate and 70% isopropyl alcohol) Prep Area: Entire Posterior Lumbar Region from lower scapular tip down to mid buttocks area and from flank to flank. Materials:  Tray: Epidural tray Needle(s):  Type: Epidural needle (Tuohy) Gauge (G):  17 Length: Regular (3.5-in) Qty: 1   H&P (Pre-op Assessment):  Mr. Hemp is a 72 y.o. (year old), male patient, seen today for interventional treatment. He  has a past surgical history that includes Neck surgery (2000 approx); Spine surgery; and Prostate surgery (04/2022). Mr. Argomaniz has a current medication list which includes the following prescription(s): vitamin c, atorvastatin, cyclobenzaprine, finasteride, ibuprofen, rivaroxaban, tramadol, and triamcinolone ointment. His primarily concern today is the Back Pain  Initial Vital Signs:  Pulse/HCG Rate:  ECG Heart Rate: 100 Temp: (!) 97.5 F (36.4 C) Resp: 16 BP: 127/83 SpO2: 99 %  BMI: Estimated body mass index is 25.81 kg/m as calculated from the following:   Height as of this encounter: 6\' 4"  (1.93 m).   Weight as of this encounter: 212 lb (96.2 kg).  Risk Assessment: Allergies: Reviewed. He has no known  allergies.  Allergy Precautions: None required Coagulopathies: Reviewed. None identified.  Blood-thinner therapy: None at this time Active Infection(s): Reviewed. None identified. Mr. Colberg is afebrile  Site Confirmation: Mr. Wohlwend was asked to confirm the procedure and laterality before  marking the site Procedure checklist: Completed Consent: Before the procedure and under the influence of no sedative(s), amnesic(s), or anxiolytics, the patient was informed of the treatment options, risks and possible complications. To fulfill our ethical and legal obligations, as recommended by the American Medical Association's Code of Ethics, I have informed the patient of my clinical impression; the nature and purpose of the treatment or procedure; the risks, benefits, and possible complications of the intervention; the alternatives, including doing nothing; the risk(s) and benefit(s) of the alternative treatment(s) or procedure(s); and the risk(s) and benefit(s) of doing nothing. The patient was provided information about the general risks and possible complications associated with the procedure. These may include, but are not limited to: failure to achieve desired goals, infection, bleeding, organ or nerve damage, allergic reactions, paralysis, and death. In addition, the patient was informed of those risks and complications associated to Spine-related procedures, such as failure to decrease pain; infection (i.e.: Meningitis, epidural or intraspinal abscess); bleeding (i.e.: epidural hematoma, subarachnoid hemorrhage, or any other type of intraspinal or peri-dural bleeding); organ or nerve damage (i.e.: Any type of peripheral nerve, nerve root, or spinal cord injury) with subsequent damage to sensory, motor, and/or autonomic systems, resulting in permanent pain, numbness, and/or weakness of one or several areas of the body; allergic reactions; (i.e.: anaphylactic reaction); and/or death. Furthermore, the patient was informed of those risks and complications associated with the medications. These include, but are not limited to: allergic reactions (i.e.: anaphylactic or anaphylactoid reaction(s)); adrenal axis suppression; blood sugar elevation that in diabetics may result in ketoacidosis or comma; water  retention that in patients with history of congestive heart failure may result in shortness of breath, pulmonary edema, and decompensation with resultant heart failure; weight gain; swelling or edema; medication-induced neural toxicity; particulate matter embolism and blood vessel occlusion with resultant organ, and/or nervous system infarction; and/or aseptic necrosis of one or more joints. Finally, the patient was informed that Medicine is not an exact science; therefore, there is also the possibility of unforeseen or unpredictable risks and/or possible complications that may result in a catastrophic outcome. The patient indicated having understood very clearly. We have given the patient no guarantees and we have made no promises. Enough time was given to the patient to ask questions, all of which were answered to the patient's satisfaction. Mr. Jentsch has indicated that he wanted to continue with the procedure. Attestation: I, the ordering provider, attest that I have discussed with the patient the benefits, risks, side-effects, alternatives, likelihood of achieving goals, and potential problems during recovery for the procedure that I have provided informed consent. Date  Time: 12/20/2022 11:16 AM   Pre-Procedure Preparation:  Monitoring: As per clinic protocol. Respiration, ETCO2, SpO2, BP, heart rate and rhythm monitor placed and checked for adequate function Safety Precautions: Patient was assessed for positional comfort and pressure points before starting the procedure. Time-out: I initiated and conducted the "Time-out" before starting the procedure, as per protocol. The patient was asked to participate by confirming the accuracy of the "Time Out" information. Verification of the correct person, site, and procedure were performed and confirmed by me, the nursing staff, and the patient. "Time-out" conducted as per Joint Commission's Universal Protocol (UP.01.01.01). Time: 1128 Start Time: 1128  hrs.  Description/Narrative of Procedure:          Target: Epidural space via interlaminar opening, initially targeting the lower laminar border of the superior vertebral body. Region: Lumbar Approach: Percutaneous paravertebral  Rationale (medical necessity): procedure needed and proper for the diagnosis and/or treatment of the patient's medical symptoms and needs. Procedural Technique Safety Precautions: Aspiration looking for blood return was conducted prior to all injections. At no point did we inject any substances, as a needle was being advanced. No attempts were made at seeking any paresthesias. Safe injection practices and needle disposal techniques used. Medications properly checked for expiration dates. SDV (single dose vial) medications used. Description of the Procedure: Protocol guidelines were followed. The procedure needle was introduced through the skin, ipsilateral to the reported pain, and advanced to the target area. Bone was contacted and the needle walked caudad, until the lamina was cleared. The epidural space was identified using "loss-of-resistance technique" with 2-3 ml of PF-NaCl (0.9% NSS), in a 5cc LOR glass syringe.  Vitals:   12/20/22 1117 12/20/22 1127 12/20/22 1132 12/20/22 1135  BP:  137/89 134/73   Resp:  18 16 15   Temp:      SpO2:  95% 98% 100%  Weight:      Height: 6\' 4"  (1.93 m)       Start Time: 1128 hrs. End Time: 1135 hrs.  Imaging Guidance (Spinal):          Type of Imaging Technique: Fluoroscopy Guidance (Spinal) Indication(s): Fluoroscopy guidance for needle placement to enhance accuracy in procedures requiring precise needle localization for targeted delivery of medication in or near specific anatomical locations not easily accessible without such real-time imaging assistance. Exposure Time: Please see nurses notes. Contrast: Before injecting any contrast, we confirmed that the patient did not have an allergy to iodine, shellfish, or  radiological contrast. Once satisfactory needle placement was completed at the desired level, radiological contrast was injected. Contrast injected under live fluoroscopy. No contrast complications. See chart for type and volume of contrast used. Fluoroscopic Guidance: I was personally present during the use of fluoroscopy. "Tunnel Vision Technique" used to obtain the best possible view of the target area. Parallax error corrected before commencing the procedure. "Direction-depth-direction" technique used to introduce the needle under continuous pulsed fluoroscopy. Once target was reached, antero-posterior, oblique, and lateral fluoroscopic projection used confirm needle placement in all planes. Images permanently stored in EMR. Interpretation: I personally interpreted the imaging intraoperatively. Adequate needle placement confirmed in multiple planes. Appropriate spread of contrast into desired area was observed. No evidence of afferent or efferent intravascular uptake. No intrathecal or subarachnoid spread observed. Permanent images saved into the patient's record.  Antibiotic Prophylaxis:   Anti-infectives (From admission, onward)    None      Indication(s): None identified  Post-operative Assessment:  Post-procedure Vital Signs:  Pulse/HCG Rate:  (!) 102 Temp: (!) 97.5 F (36.4 C) Resp: 15 BP: 134/73 SpO2: 100 %  EBL: None  Complications: No immediate post-treatment complications observed by team, or reported by patient.  Note: The patient tolerated the entire procedure well. A repeat set of vitals were taken after the procedure and the patient was kept under observation following institutional policy, for this type of procedure. Post-procedural neurological assessment was performed, showing return to baseline, prior to discharge. The patient was provided with post-procedure discharge instructions, including a section on how to identify potential problems. Should any problems arise  concerning this procedure, the patient was given instructions to immediately contact us, at  any time, without hesitation. In any case, we plan to contact the patient by telephone for a follow-up status report regarding this interventional procedure.  Comments:  No additional relevant information.  Plan of Care (POC)  Orders:  Orders Placed This Encounter  Procedures   Lumbar Epidural Injection    Scheduling Instructions:     Procedure: Interlaminar LESI L3-4     Laterality: Left     Sedation: No Sedation     Timeframe: Today    Where will this procedure be performed?:   [facility]   [facility] C-ARM 1-60 MIN NO REPORT    Intraoperative interpretation by procedural physician at [facility].    Standing Status:   Standing    Number of Occurrences:   1    Reason for exam::   Assistance in needle guidance and placement for procedures requiring needle placement in or near specific anatomical locations not easily accessible without such assistance.   Informed Consent Details: Physician/Practitioner Attestation; Transcribe to consent form and obtain patient signature    Note: Always confirm laterality of pain with Mr. Shon, before procedure. Transcribe to consent form and obtain patient signature.    Physician/Practitioner attestation of informed consent for procedure/surgical case:   I, the physician/practitioner, attest that I have discussed with the patient the benefits, risks, side effects, alternatives, likelihood of achieving goals and potential problems during recovery for the procedure that I have provided informed consent.    Procedure:   Lumbar epidural steroid injection under fluoroscopic guidance    Physician/Practitioner performing the procedure:   Joye Wesenberg A. Laban Emperor, MD    Indication/Reason:   Low back and/or lower extremity pain secondary to lumbar radiculitis   Provide equipment / supplies at bedside    Procedural tray: Epidural Tray (Disposable   single use) Skin infiltration needle: Regular 1.5-in, 25-G, (x1) Block needle size: Regular standard Catheter: No catheter required    Standing Status:   Standing    Number of Occurrences:   1    Specify:   Epidural Tray   Chronic Opioid Analgesic:   No chronic opioid analgesics therapy prescribed by our practice. Tramadol 50 mg, half a tablet p.o. daily MME/day: 2.5 mg/day   Medications ordered for procedure: Meds ordered this encounter  Medications   iohexol (OMNIPAQUE) 180 MG/ML injection 10 mL    Must be Myelogram-compatible. If not available, you may substitute with a water-soluble, non-ionic, hypoallergenic, myelogram-compatible radiological contrast medium.   lidocaine (XYLOCAINE) 2 % (with pres) injection 400 mg   pentafluoroprop-tetrafluoroeth (GEBAUERS) aerosol   sodium chloride flush (NS) 0.9 % injection 2 mL   ropivacaine (PF) 2 mg/mL (0.2%) (NAROPIN) injection 2 mL   triamcinolone acetonide (KENALOG-40) injection 40 mg   Medications administered: We administered iohexol, lidocaine, pentafluoroprop-tetrafluoroeth, sodium chloride flush, ropivacaine (PF) 2 mg/mL (0.2%), and triamcinolone acetonide.  See the medical record for exact dosing, route, and time of administration.  Follow-up plan:   Return in about 2 weeks (around 01/03/2023) for (VV), (PPE).       Interventional Therapies  Risk  Complexity Considerations:   Xarelto ANTICOAGULATION: (Stop: 3 days  Restart: 6 hours)   Planned  Pending:   Therapeutic left L3-4 LESI #2    Under consideration:   Diagnostic bilateral (L2-S1) lumbar facet MBB #1  Therapeutic midline L2-3 LESI #1  Therapeutic L2-3, L3-4, and L4-5 MILD procedure   Completed:   Therapeutic right L3-4 LESI #1 (12/04/2022) (100/100/100/100 for the right lower extremity pain that  he was experiencing) Diagnostic/therapeutic left L3 & L4 TESI x1 (09/26/2021) (100/100/100/100 x 2 months) Therapeutic left L5 TFESI x2 (04/25/2021) (100/100/100/90-100)   Therapeutic left L5-S1 LESI x2 (08/30/2022) (100/100/70/ LBP:90  LLEP:100)  Therapeutic left L3-4 LESI x1 (02/16/2020) (90/90/65/65)  Therapeutic right L3-4 LESI x1 (04/25/2021) (100/100/100/90-100)    Therapeutic  Palliative (PRN) options:         Recent Visits Date Type Provider Dept  12/18/22 Office Visit Delano Metz, MD [facility]  12/04/22 Procedure visit Delano Metz, MD [facility]  11/21/22 Office Visit Delano Metz, MD [facility]  Showing recent visits within past 90 days and meeting all other requirements Today's Visits Date Type Provider Dept  12/20/22 Procedure visit Delano Metz, MD [facility]  Showing today's visits and meeting all other requirements Future Appointments Date Type Provider Dept  01/09/23 Appointment Edward Jolly, MD [facility]  Showing future appointments within next 90 days and meeting all other requirements  Disposition: Discharge home  Discharge (Date  Time): 12/20/2022; 1145 hrs.   Primary Care Physician: Rueben Bash, NP Location: [facility] Note by: Oswaldo Done, MD (TTS technology used. I apologize for any typographical errors that were not detected and corrected.) Date: 12/20/2022; Time: 11:46 AM  Disclaimer:  Medicine is not an Visual merchandiser. The only guarantee in medicine is that nothing is guaranteed. It is important to note that the decision to proceed with this intervention was based on the information collected from the patient. The Data and conclusions were drawn from the patient's questionnaire, the interview, and the physical examination. Because the information was provided in large part by the patient, it cannot be guaranteed that it has not been purposely or unconsciously manipulated. Every effort has been made to obtain as much relevant data as possible for this evaluation. It is important to note that the  conclusions that lead to this procedure are derived in large part from the available data. Always take into account that the treatment will also be dependent on availability of resources and existing treatment guidelines, considered by other Pain Management Practitioners as being common knowledge and practice, at the time of the intervention. For Medico-Legal purposes, it is also important to point out that variation in procedural techniques and pharmacological choices are the acceptable norm. The indications, contraindications, technique, and results of the above procedure should only be interpreted and judged by a Board-Certified Interventional Pain Specialist with extensive familiarity and expertise in the same exact procedure and technique.

## 2022-12-19 NOTE — Patient Instructions (Incomplete)

## 2022-12-20 ENCOUNTER — Ambulatory Visit
Admission: RE | Admit: 2022-12-20 | Discharge: 2022-12-20 | Disposition: A | Discharge status: Home / Self Care | Payer: Medicare Other | Source: Ambulatory Visit | Attending: Pain Medicine | Admitting: Pain Medicine

## 2022-12-20 ENCOUNTER — Encounter: Payer: Self-pay | Admitting: Pain Medicine

## 2022-12-20 ENCOUNTER — Ambulatory Visit: Payer: Medicare Other | Attending: Pain Medicine | Admitting: Pain Medicine

## 2022-12-20 VITALS — BP 134/73 | Temp 97.5°F | Resp 15 | Ht 76.0 in | Wt 212.0 lb

## 2022-12-20 DIAGNOSIS — M4696 Unspecified inflammatory spondylopathy, lumbar region: Secondary | ICD-10-CM

## 2022-12-20 DIAGNOSIS — M4316 Spondylolisthesis, lumbar region: Secondary | ICD-10-CM

## 2022-12-20 DIAGNOSIS — M51362 Other intervertebral disc degeneration, lumbar region with discogenic back pain and lower extremity pain: Secondary | ICD-10-CM | POA: Diagnosis present

## 2022-12-20 DIAGNOSIS — M5441 Lumbago with sciatica, right side: Secondary | ICD-10-CM | POA: Diagnosis present

## 2022-12-20 DIAGNOSIS — M48062 Spinal stenosis, lumbar region with neurogenic claudication: Secondary | ICD-10-CM

## 2022-12-20 DIAGNOSIS — M545 Low back pain, unspecified: Secondary | ICD-10-CM | POA: Diagnosis present

## 2022-12-20 DIAGNOSIS — G8929 Other chronic pain: Secondary | ICD-10-CM | POA: Diagnosis not present

## 2022-12-20 DIAGNOSIS — M48061 Spinal stenosis, lumbar region without neurogenic claudication: Secondary | ICD-10-CM

## 2022-12-20 DIAGNOSIS — M79605 Pain in left leg: Secondary | ICD-10-CM | POA: Insufficient documentation

## 2022-12-20 DIAGNOSIS — M5442 Lumbago with sciatica, left side: Secondary | ICD-10-CM | POA: Insufficient documentation

## 2022-12-20 DIAGNOSIS — M5136 Other intervertebral disc degeneration, lumbar region with discogenic back pain only: Secondary | ICD-10-CM

## 2022-12-20 DIAGNOSIS — M5417 Radiculopathy, lumbosacral region: Secondary | ICD-10-CM

## 2022-12-20 MED ORDER — ROPIVACAINE HCL 2 MG/ML IJ SOLN
2.0000 mL | Freq: Once | INTRAMUSCULAR | Status: AC
Start: 2022-12-20 — End: 2022-12-20
  Administered 2022-12-20: 2 mL via EPIDURAL

## 2022-12-20 MED ORDER — TRIAMCINOLONE ACETONIDE 40 MG/ML IJ SUSP
40.0000 mg | Freq: Once | INTRAMUSCULAR | Status: AC
  Administered 2022-12-20: 40 mg

## 2022-12-20 MED ORDER — ROPIVACAINE HCL 2 MG/ML IJ SOLN
INTRAMUSCULAR | Status: AC
  Filled 2022-12-20: qty 20

## 2022-12-20 MED ORDER — SODIUM CHLORIDE 0.9% FLUSH
2.0000 mL | Freq: Once | INTRAVENOUS | Status: AC
Start: 2022-12-20 — End: 2022-12-20
  Administered 2022-12-20: 2 mL

## 2022-12-20 MED ORDER — LIDOCAINE HCL (PF) 2 % IJ SOLN
INTRAMUSCULAR | Status: AC
Start: 2022-12-20 — End: ?
  Filled 2022-12-20: qty 10

## 2022-12-20 MED ORDER — IOHEXOL 180 MG/ML  SOLN
10.0000 mL | Freq: Once | INTRAMUSCULAR | Status: AC
Start: 2022-12-20 — End: 2022-12-20
  Administered 2022-12-20: 10 mL via EPIDURAL
  Filled 2022-12-20: qty 20

## 2022-12-20 MED ORDER — PENTAFLUOROPROP-TETRAFLUOROETH EX AERO
INHALATION_SPRAY | Freq: Once | CUTANEOUS | Status: AC
Start: 2022-12-20 — End: 2022-12-20
  Administered 2022-12-20: 30 via TOPICAL

## 2022-12-20 MED ORDER — SODIUM CHLORIDE (PF) 0.9 % IJ SOLN
INTRAMUSCULAR | Status: AC
  Filled 2022-12-20: qty 10

## 2022-12-20 MED ORDER — TRIAMCINOLONE ACETONIDE 40 MG/ML IJ SUSP
INTRAMUSCULAR | Status: AC
  Filled 2022-12-20: qty 1

## 2022-12-20 MED ORDER — LIDOCAINE HCL 2 % IJ SOLN
20.0000 mL | Freq: Once | INTRAMUSCULAR | Status: AC
  Administered 2022-12-20: 100 mg

## 2022-12-20 NOTE — Progress Notes (Signed)
Safety precautions to be maintained throughout the outpatient stay will include: orient to surroundings, keep bed in low position, maintain call bell within reach at all times, provide assistance with transfer out of bed and ambulation.  

## 2022-12-21 ENCOUNTER — Telehealth: Payer: Self-pay | Admitting: *Deleted

## 2022-12-21 NOTE — Telephone Encounter (Signed)
Post procedure call:   no  questions or concerns.  

## 2023-01-08 NOTE — Progress Notes (Signed)
 Patient: Todd Bradford  Service Category: E/M  Provider: Eric Bradford Como, MD  DOB: [birth year]  DOS: 01/09/2023  Location: Office  MRN: [id number]  Setting: Ambulatory outpatient  Referring Provider: Burton Lauraine DELENA, NP  Type: Established Patient  Specialty: Interventional Pain Management  PCP: Todd Lauraine DELENA, NP  Location: Remote location  Delivery: TeleHealth     Virtual Encounter - Pain Management PROVIDER NOTE: Information contained herein reflects review and annotations entered in association with encounter. Interpretation of such information and data should be left to medically-trained personnel. Information provided to patient can be located elsewhere in the medical record under Patient Instructions. Document created using STT-dictation technology, any transcriptional errors that may result from process are unintentional.    Contact & Pharmacy Preferred: [phone] Home: [phone] (home) Mobile: [phone] (mobile) E-mail: [email or web address]  [facility] - [place], [place] - [address] [place] [address] Phone: [phone] Fax: [phone]   Pre-screening  Todd Bradford offered in-person vs virtual encounter. He indicated preferring virtual for this encounter.   Reason COVID-19*  Social distancing based on CDC and AMA recommendations.   I contacted Todd Bradford on 01/09/2023 via telephone.      I clearly identified myself as Todd Bradford Como, MD. I verified that I was speaking with the correct person using two identifiers (Name: Todd Bradford, and date of birth: Todd Bradford).  Consent I sought verbal advanced consent from Todd Bradford for virtual visit interactions. I informed Todd Bradford of possible security and privacy concerns, risks, and limitations associated with providing not-in-person medical evaluation and management services. I also informed Todd Bradford of the availability of in-person appointments. Finally,  I informed him that there would be a charge for the virtual visit and that he could be  personally, fully or partially, financially responsible for it. Todd Bradford expressed understanding and agreed to proceed.   Historic Elements   Todd Bradford is a 73 y.o. year old, male patient evaluated today after our last contact on 12/20/2022. Todd Bradford  has a past medical history of Hyperlipidemia. He also  has a past surgical history that includes Neck surgery (2000 approx); Spine surgery; and Prostate surgery (04/2022). Todd Bradford has a current medication list which includes the following prescription(s): vitamin c, atorvastatin, cyclobenzaprine, finasteride, ibuprofen, rivaroxaban , tramadol, and triamcinolone  ointment. He  reports that he quit smoking about 18 years ago. His smoking use included cigarettes. He started smoking about 48 years ago. He has a 60 pack-year smoking history. He has never used smokeless tobacco. No history on file for alcohol use and drug use. Todd Bradford.  BMI: Estimated body mass index is 25.81 kg/m as calculated from the following:   Height as of 12/20/22: 6' 4 (1.93 m).   Weight as of 12/20/22: 212 lb (96.2 kg). Last encounter: 12/18/2022. Last procedure: 12/20/2022.  HPI  Today, he is being contacted for a post-procedure assessment.  Post-procedure evaluation   Type: Lumbar epidural steroid injection (LESI) (interlaminar) #2    Laterality: Left   Level:  L3-4 Level.  Imaging: Fluoroscopic guidance Spinal ([id number]) Anesthesia: Local anesthesia (1-2% Lidocaine ) Anxiolysis: None                 Sedation: No Sedation                       DOS: 12/20/2022  Performed by: Todd Bradford Como, MD  Purpose: Diagnostic/Therapeutic Indications: Lumbar  radicular pain of intraspinal etiology of more than 4 weeks that has failed to respond to conservative therapy and is severe enough to impact quality of life or function. 1. Chronic low back pain (1ry  area of Pain) (Left) w/ sciatica (Left)   2. Chronic low back pain (Left) w/ sciatica (Bilateral)   3. Chronic low back pain (Left) w/o sciatica   4. Chronic lower extremity pain (2ry area of Pain) (Left)   5. Degeneration of intervertebral disc of lumbar region with discogenic back pain and lower extremity pain   6. Grade 1 (3 mm) Anterolisthesis of lumbar spine L4/L5   7. 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 Lumbar foraminal stenosis (Bilateral: L3-4, L4-5) (Left: L5-S1)   11. Lumbar lateral recess stenosis  (Right: L3-4)   12. Lumbosacral radiculopathy at L5 (Bilateral)   13. Lumbosacral radiculopathy at S1 (Bilateral)    NAS-11 Pain score:   Pre-procedure: 8 /10   Post-procedure: 8 /10     Effectiveness:  Initial hour after procedure: 100 %. Subsequent 4-6 hours post-procedure: 90 %. Analgesia past initial 6 hours: 65 % (continues to have some pain in left hip, unable to walk any distance before the pain starts.). Ongoing improvement:  Analgesic: The patient indicates that for the first couple of weeks after the procedure he was not having any pain and he was able to do just about everything.  Starting on the second week he noticed that if he attempted to do anything out of the ordinary he would then begin to experience pain in the area of the left hip.  Currently he describes having absolutely no pain, but he still refers that if he starts doing things over and beyond, then the pain starts but he has noticed that at this point he goes away when he sits down and rests. Function: Todd Bradford reports improvement in function ROM: Todd Bradford reports improvement in ROM  Pharmacotherapy Assessment  Opioid Analgesic: No chronic opioid analgesics therapy prescribed by our practice. Tramadol 50 mg, half a tablet p.o. daily MME/day: 2.5  mg/day   Monitoring:  PMP: PDMP reviewed during this encounter.       Pharmacotherapy: No side-effects or adverse reactions reported. Compliance: No problems identified. Effectiveness: Clinically acceptable. Plan: Refer to POC. UDS:  Summary  Date Value Ref Range Status  02/16/2020 FINAL  Final    Comment:    ==================================================================== [facility] COMP DRUG ANALYSIS,UR ==================================================================== Test                             Result       Flag       Units Drug Present and Declared for Prescription Verification   Tramadol                       5215         EXPECTED   ng/mg creat   O-Desmethyltramadol            6226         EXPECTED   ng/mg creat   N-Desmethyltramadol            1269         EXPECTED   ng/mg creat    Source of tramadol is a prescription medication.  O-desmethyltramadol and N-desmethyltramadol are expected    metabolites of tramadol.    Cyclobenzaprine                PRESENT      EXPECTED   Desmethylcyclobenzaprine       PRESENT      EXPECTED    Desmethylcyclobenzaprine is an expected metabolite of    cyclobenzaprine.  Drug Present not Declared for Prescription Verification   Ephedrine/Pseudoephedrine      PRESENT      UNEXPECTED   Phenylpropanolamine            PRESENT      UNEXPECTED    Source of ephedrine/pseudoephedrine is most commonly    pseudoephedrine in over-the-counter or prescription cold and    allergy medications. Phenylpropanolamine is an expected    metabolite of ephedrine/pseudoephedrine.    Orphenadrine                    PRESENT      UNEXPECTED   Ibuprofen                      PRESENT      UNEXPECTED   Diphenhydramine                PRESENT      UNEXPECTED ==================================================================== Test                      Result    Flag   Units      Ref Range   Creatinine              74               mg/dL       >=79 ==================================================================== Declared Medications:  The flagging and interpretation on this report are based on the  following declared medications.  Unexpected results may arise from  inaccuracies in the declared medications.   **Note: The testing scope of this panel includes these medications:   Cyclobenzaprine (Flexeril)  Tramadol (Ultram)   **Note: The testing scope of this panel does not include following  reported medications:   Atorvastatin (Lipitor)  Finasteride (Proscar)  Tamsulosin (Flomax) ==================================================================== For clinical consultation, please call [phone]. ====================================================================    No results found for: CBDTHCR, D8THCCBX, D9THCCBX   Laboratory Chemistry Profile   Renal Lab Results  Component Value Date   BUN 26 (H) 03/01/2020   CREATININE 1.24 03/01/2020   BCR 18 02/15/2020   GFRAA 64 02/15/2020   GFRNONAA >60 03/01/2020    Hepatic Lab Results  Component Value Date   AST 45 (H) 03/01/2020   ALT 77 (H) 03/01/2020   ALBUMIN 3.6 03/01/2020   ALKPHOS 161 (H) 03/01/2020    Electrolytes Lab Results  Component Value Date   NA 136 03/01/2020   K 4.3 03/01/2020   CL 100 03/01/2020   CALCIUM 8.9 03/01/2020   MG 2.3 02/15/2020    Bone Lab Results  Component Value Date   25OHVITD1 37 02/15/2020   25OHVITD2 <1.0 02/15/2020   25OHVITD3 37 02/15/2020    Inflammation (CRP: Acute Phase) (ESR: Chronic Phase) Lab Results  Component Value Date   CRP 9 02/15/2020   ESRSEDRATE 19 02/15/2020         Note: Above Lab results reviewed.  Imaging  DG PAIN CLINIC C-ARM 1-60 MIN NO REPORT Fluoro was used, but no Radiologist interpretation will be provided.  Please refer to NOTES tab  for provider progress note.  Assessment  The primary encounter diagnosis was Chronic low back pain (1ry area of Pain) (Left) w/  sciatica (Left). 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  Plan of Care  Problem-specific:  No problem-specific Assessment & Plan notes found for this encounter.  Mr. Earsel Shouse is not on any long-term medications.  Pharmacotherapy (Medications Ordered): No orders of the defined types were placed in this encounter.  Orders:  Orders Placed This Encounter  Procedures   Nursing Instructions:    Please complete this patient's postprocedure evaluation.    Scheduling Instructions:     Please complete this patient's postprocedure evaluation.   Follow-up plan:   No follow-ups on file.      Interventional Therapies  Risk  Complexity Considerations:   Xarelto  ANTICOAGULATION: (Stop: 3 days  Restart: 6 hours)   Planned  Pending:      Under consideration:   Diagnostic bilateral (L2-S1) lumbar facet MBB #1  Therapeutic midline L2-3 LESI #1  Therapeutic L2-3, L3-4, and L4-5 MILD procedure   Completed:   Therapeutic right L3-4 LESI #1 (12/04/2022) (100/100/100/100 for the right lower extremity pain that he was experiencing) Diagnostic/therapeutic left L3 & L4 TESI x1 (09/26/2021) (100/100/100/100 x 2 months) Therapeutic left L5 TFESI x2 (04/25/2021) (100/100/100/90-100)  Therapeutic left L5-S1 LESI x2 (08/30/2022) (100/100/70/ LBP:90  LLEP:100)  Therapeutic left L3-4 LESI x2 (12/20/2022) (100/90/65/100-65)  Therapeutic right L3-4 LESI x1 (04/25/2021) (100/100/100/90-100)    Therapeutic  Palliative (PRN) options:          Recent Visits Date Type Provider Dept  12/20/22 Procedure visit Tanya Glisson, MD [facility]  12/18/22 Office Visit Tanya Glisson, MD [facility]  12/04/22 Procedure visit Tanya Glisson, MD [facility]  11/21/22 Office Visit Tanya Glisson, MD [facility]  Showing recent visits within past 90 days and meeting all other requirements Today's Visits Date Type Provider Dept  01/09/23 Office Visit Tanya Glisson, MD [facility]  Showing today's visits and meeting all other requirements Future Appointments No visits were found meeting these conditions. Showing future appointments within next 90 days and meeting all other requirements  I discussed the assessment and treatment plan with the patient. The patient was provided an opportunity to ask questions and all were answered. The patient agreed with the plan and demonstrated an understanding of the instructions.  Patient advised to call back or seek an in-person evaluation if the symptoms or condition worsens.  Duration of encounter: 11 minutes.  Note by: Glisson Bradford Tanya, MD Date: 01/09/2023; Time: 4:07 PM

## 2023-01-09 ENCOUNTER — Ambulatory Visit: Payer: Medicare Other | Attending: Pain Medicine | Admitting: Pain Medicine

## 2023-01-09 DIAGNOSIS — M5442 Lumbago with sciatica, left side: Secondary | ICD-10-CM

## 2023-01-09 DIAGNOSIS — M25552 Pain in left hip: Secondary | ICD-10-CM

## 2023-01-09 DIAGNOSIS — M51362 Other intervertebral disc degeneration, lumbar region with discogenic back pain and lower extremity pain: Secondary | ICD-10-CM

## 2023-01-09 DIAGNOSIS — M5441 Lumbago with sciatica, right side: Secondary | ICD-10-CM

## 2023-01-09 DIAGNOSIS — M5136 Other intervertebral disc degeneration, lumbar region with discogenic back pain only: Secondary | ICD-10-CM

## 2023-01-09 DIAGNOSIS — M79605 Pain in left leg: Secondary | ICD-10-CM | POA: Diagnosis not present

## 2023-01-09 DIAGNOSIS — M4316 Spondylolisthesis, lumbar region: Secondary | ICD-10-CM | POA: Diagnosis not present

## 2023-01-09 DIAGNOSIS — G8929 Other chronic pain: Secondary | ICD-10-CM

## 2023-01-09 DIAGNOSIS — Z09 Encounter for follow-up examination after completed treatment for conditions other than malignant neoplasm: Secondary | ICD-10-CM

## 2023-01-09 DIAGNOSIS — M48062 Spinal stenosis, lumbar region with neurogenic claudication: Secondary | ICD-10-CM

## 2023-04-09 ENCOUNTER — Telehealth: Payer: Self-pay | Admitting: Pain Medicine

## 2023-04-09 NOTE — Telephone Encounter (Signed)
 PT called stated that last visit he discuss with Laban Emperor  that he stayed in [place] and was told that he could have [facility] appt to discuss pain. I went ahead and schedule appt for 04-15-23 F2F. Until I get the okay. TY

## 2023-04-15 ENCOUNTER — Ambulatory Visit: Attending: Pain Medicine | Admitting: Pain Medicine

## 2023-04-15 DIAGNOSIS — Z7901 Long term (current) use of anticoagulants: Secondary | ICD-10-CM

## 2023-04-15 DIAGNOSIS — M2428 Disorder of ligament, vertebrae: Secondary | ICD-10-CM

## 2023-04-15 DIAGNOSIS — I10 Essential (primary) hypertension: Secondary | ICD-10-CM | POA: Insufficient documentation

## 2023-04-15 DIAGNOSIS — M48062 Spinal stenosis, lumbar region with neurogenic claudication: Secondary | ICD-10-CM | POA: Diagnosis not present

## 2023-04-15 DIAGNOSIS — M5136 Other intervertebral disc degeneration, lumbar region with discogenic back pain only: Secondary | ICD-10-CM

## 2023-04-15 DIAGNOSIS — M5417 Radiculopathy, lumbosacral region: Secondary | ICD-10-CM

## 2023-04-15 DIAGNOSIS — M4316 Spondylolisthesis, lumbar region: Secondary | ICD-10-CM

## 2023-04-15 DIAGNOSIS — N138 Other obstructive and reflux uropathy: Secondary | ICD-10-CM | POA: Insufficient documentation

## 2023-04-15 DIAGNOSIS — R202 Paresthesia of skin: Secondary | ICD-10-CM

## 2023-04-15 DIAGNOSIS — Z6829 Body mass index (BMI) 29.0-29.9, adult: Secondary | ICD-10-CM | POA: Insufficient documentation

## 2023-04-15 DIAGNOSIS — M51362 Other intervertebral disc degeneration, lumbar region with discogenic back pain and lower extremity pain: Secondary | ICD-10-CM | POA: Diagnosis not present

## 2023-04-15 DIAGNOSIS — C4320 Malignant melanoma of unspecified ear and external auricular canal: Secondary | ICD-10-CM | POA: Insufficient documentation

## 2023-04-15 DIAGNOSIS — R937 Abnormal findings on diagnostic imaging of other parts of musculoskeletal system: Secondary | ICD-10-CM

## 2023-04-15 DIAGNOSIS — G8929 Other chronic pain: Secondary | ICD-10-CM

## 2023-04-15 DIAGNOSIS — R200 Anesthesia of skin: Secondary | ICD-10-CM

## 2023-04-15 NOTE — Progress Notes (Signed)
 PROVIDER NOTE: Interpretation of information contained herein should be left to medically-trained personnel. Specific patient instructions are provided elsewhere under "Patient Instructions" section of medical record. This document was created in part using AI and STT-dictation technology, any transcriptional errors that may result from this process are unintentional.  Patient: Todd Bradford  Service: E/M   PCP: Rueben Bash, NP  DOB: 05/27/1950  DOS: 04/15/2023  Provider: Oswaldo Done, MD  MRN: [id number]  Delivery: Virtual Visit  Specialty: Interventional Pain Management  Type: Established Patient  Setting: Ambulatory outpatient facility  Specialty designation: 09  Referring Prov.: Roddie Mc Shawn Route, NP  Location: Remote location       Virtual Encounter - Pain Management PROVIDER NOTE: Information contained herein reflects review and annotations entered in association with encounter. Interpretation of such information and data should be left to medically-trained personnel. Information provided to patient can be located elsewhere in the medical record under "Patient Instructions". Document created using STT-dictation technology, any transcriptional errors that may result from process are unintentional.    Contact & Pharmacy Preferred: [phone] Home: [phone] (home) Mobile: [phone] (mobile) E-mail: [email or web address]  [facility] - [place], [place] - [address] [place] [address] Phone: [phone] Fax: [phone]  [facility] - [place], [place] - [address] [place] [address] Phone: [phone] Fax: [phone]   Pre-screening  Mr. Mells offered "in-person" vs "virtual" encounter. He indicated preferring virtual for this encounter.   Reason COVID-19*  Social distancing based on CDC and AMA recommendations.   I contacted Todd Bradford on 04/15/2023 via telephone.      I clearly  identified myself as Oswaldo Done, MD. I verified that I was speaking with the correct person using two identifiers (Name: Lochlan Grygiel, and date of birth: 01/07/1950).  Consent I sought verbal advanced consent from Todd Bradford for virtual visit interactions. I informed Mr. Bougher of possible security and privacy concerns, risks, and limitations associated with providing "not-in-person" medical evaluation and management services. I also informed Mr. Quinney of the availability of "in-person" appointments. Finally, I informed him that there would be a charge for the virtual visit and that he could be  personally, fully or partially, financially responsible for it. Mr. Bralley expressed understanding and agreed to proceed.   Historic Elements   Mr. Cayden Granholm is a 73 y.o. year old, male patient evaluated today after our last contact on 04/09/2023. Mr. Montini  has a past medical history of Hyperlipidemia. He also  has a past surgical history that includes Neck surgery (2000 approx); Spine surgery; and Prostate surgery (04/2022). Mr. Lassen has a current medication list which includes the following prescription(s): vitamin c, atorvastatin, cyclobenzaprine, finasteride, ibuprofen, rivaroxaban, tramadol, and triamcinolone ointment. He  reports that he quit smoking about 18 years ago. His smoking use included cigarettes. He started smoking about 48 years ago. He has a 60 pack-year smoking history. He has never used smokeless tobacco. No history on file for alcohol use and drug use. Mr. Pekala has no known allergies.  BMI: Estimated body mass index is 25.81 kg/m as calculated from the following:   Height as of 12/20/22: 6\' 4"  (1.93 m).   Weight as of 12/20/22: 212 lb (96.2 kg). Last encounter: 01/09/2023. Last procedure: 12/20/2022.  HPI  Today, he is being contacted for worsening of previously known (established) problem.Discussed the use of AI scribe software for clinical note transcription with the  patient,  who gave verbal consent to proceed.  History of Present Illness   Dajohn Ellender is a 73 year old male who presents with lower back pressure and foot sensitivity.  He experiences persistent pressure in the lower back, which has become more centralized rather than on the left side. The pressure worsens with stooping and standing back up, but improves with walking, allowing him to take fewer breaks during activities like yard work.  He has tingling and sensitivity in both feet, particularly on the top, necessitating the removal of socks for comfort. He has adjusted his footwear to a larger, narrower size, which has alleviated previous hip problems. The sensitivity in his feet is described as 'real sensitive' and causes him to fidget.  No pain, numbness, or weakness in the lower extremities except for the described foot sensitivity. Previously, he had issues with his big toes, but this is no longer a concern.  He is currently taking Xarelto.       Pharmacotherapy Assessment  Opioid Analgesic: No chronic opioid analgesics therapy prescribed by our practice. Tramadol 50 mg, half a tablet p.o. daily MME/day: 2.5 mg/day     Monitoring: [place] PMP: PDMP reviewed during this encounter.       Pharmacotherapy: No side-effects or adverse reactions reported. Compliance: No problems identified. Effectiveness: Clinically acceptable. Plan: Refer to "POC". UDS:  Summary  Date Value Ref Range Status  02/16/2020 FINAL  Final    Comment:    ==================================================================== [facility] COMP DRUG ANALYSIS,UR ==================================================================== Test                             Result       Flag       Units Drug Present and Declared for Prescription Verification   Tramadol                       5215         EXPECTED   ng/mg creat   O-Desmethyltramadol            6226         EXPECTED   ng/mg creat   N-Desmethyltramadol            1269          EXPECTED   ng/mg creat    Source of tramadol is a prescription medication.    O-desmethyltramadol and N-desmethyltramadol are expected    metabolites of tramadol.    Cyclobenzaprine                PRESENT      EXPECTED   Desmethylcyclobenzaprine       PRESENT      EXPECTED    Desmethylcyclobenzaprine is an expected metabolite of    cyclobenzaprine.  Drug Present not Declared for Prescription Verification   Ephedrine/Pseudoephedrine      PRESENT      UNEXPECTED   Phenylpropanolamine            PRESENT      UNEXPECTED    Source of ephedrine/pseudoephedrine is most commonly    pseudoephedrine in over-the-counter or prescription cold and    allergy medications. Phenylpropanolamine is an expected    metabolite of ephedrine/pseudoephedrine.    Orphenadrine                   PRESENT      UNEXPECTED   Ibuprofen  PRESENT      UNEXPECTED   Diphenhydramine                PRESENT      UNEXPECTED ==================================================================== Test                      Result    Flag   Units      Ref Range   Creatinine              74               mg/dL      >=16 ==================================================================== Declared Medications:  The flagging and interpretation on this report are based on the  following declared medications.  Unexpected results may arise from  inaccuracies in the declared medications.   **Note: The testing scope of this panel includes these medications:   Cyclobenzaprine (Flexeril)  Tramadol (Ultram)   **Note: The testing scope of this panel does not include following  reported medications:   Atorvastatin (Lipitor)  Finasteride (Proscar)  Tamsulosin (Flomax) ==================================================================== For clinical consultation, please call [phone]. ====================================================================    No results found for: "CBDTHCR", "D8THCCBX",  "D9THCCBX"   Laboratory Chemistry Profile   Renal Lab Results  Component Value Date   BUN 26 (H) 03/01/2020   CREATININE 1.24 03/01/2020   BCR 18 02/15/2020   GFRAA 64 02/15/2020   GFRNONAA >60 03/01/2020    Hepatic Lab Results  Component Value Date   AST 45 (H) 03/01/2020   ALT 77 (H) 03/01/2020   ALBUMIN 3.6 03/01/2020   ALKPHOS 161 (H) 03/01/2020    Electrolytes Lab Results  Component Value Date   NA 136 03/01/2020   K 4.3 03/01/2020   CL 100 03/01/2020   CALCIUM 8.9 03/01/2020   MG 2.3 02/15/2020    Bone Lab Results  Component Value Date   25OHVITD1 37 02/15/2020   25OHVITD2 <1.0 02/15/2020   25OHVITD3 37 02/15/2020    Inflammation (CRP: Acute Phase) (ESR: Chronic Phase) Lab Results  Component Value Date   CRP 9 02/15/2020   ESRSEDRATE 19 02/15/2020         Note: Above Lab results reviewed.  Imaging  DG PAIN CLINIC C-ARM 1-60 MIN NO REPORT Fluoro was used, but no Radiologist interpretation will be provided.  Please refer to "NOTES" tab for provider progress note.  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  Plan of Care  Assessment and Plan    Lumbar spondylolisthesis with L5 nerve irritation   He experiences chronic lower back pressure, now more centralized, with tingling and sensitivity in both feet, particularly on the top. These symptoms align with L5 nerve irritation due to  grade one anterolisthesis of L4 over L5, which reduces space in the lateral recess and central canal, potentially irritating the L5 nerve. A previous right L3-4 epidural provided significant relief, suggesting potential benefit from a similar intervention. Schedule an L3-4 epidural injection. Stop Xarelto three days prior to the procedure once the appointment date is confirmed.  Anticoagulation management   He is on Xarelto for anticoagulation. Stop Xarelto three days prior to the epidural procedure to minimize bleeding risk.  Hypertension   Hypertension is well-managed with medication.      Problem-specific:  No problem-specific Assessment & Plan notes found for this encounter.  Mr. Robyn Nohr is not on any long-term medications.  Pharmacotherapy (Medications Ordered): No orders of the defined types were placed in this encounter.  Orders:  Orders Placed This Encounter  Procedures   Lumbar Epidural Injection    Standing Status:   Future    Expiration Date:   07/15/2023    Scheduling Instructions:     Procedure: Interlaminar Lumbar Epidural Steroid injection (LESI)  L3-4     Laterality: Midline     Sedation: Patient's choice.     Timeframe: see Blood-thinner protocol.    Where will this procedure be performed?:   [facility]   Blood Thinner Instructions to Nursing    Always make sure patient has clearance from prescribing physician to stop blood thinners for interventional therapies. If the patient requires a Lovenox-bridge therapy, make sure arrangements are made to institute it with the assistance of the PCP.    Scheduling Instructions:     Have Mr. Lanigan stop the Xarelto (Rivaroxaban) x 3 days prior to procedure or surgery.   Follow-up plan:   Return for [facility]): (ML) L3-4 LESI #2, (Blood Thinner Protocol).      Interventional Therapies  Risk  Complexity Considerations:   Xarelto ANTICOAGULATION: (Stop: 3 days  Restart: 6 hours)   Planned  Pending:    Therapeutic midline L3-4 LESI #2    Under consideration:   Diagnostic bilateral (L2-S1) lumbar facet MBB #1  Therapeutic midline L2-3 LESI #1  Therapeutic L2-3, L3-4, and L4-5 MILD procedure   Completed:   Therapeutic right L3-4 LESI x1 (12/04/2022) (100/100/100/100 for the right lower extremity pain that he was experiencing) Diagnostic/therapeutic left L3 & L4 TESI x1 (09/26/2021) (100/100/100/100 x 2 months) Therapeutic left L5 TFESI x2 (04/25/2021) (100/100/100/90-100)  Therapeutic left L5-S1 LESI x2 (08/30/2022) (100/100/70/ LBP:90  LLEP:100)  Therapeutic left L3-4 LESI x2 (12/20/2022) (100/90/65/100-65)  Therapeutic right L3-4 LESI x1 (04/25/2021) (100/100/100/90-100)    Therapeutic  Palliative (PRN) options:        Recent Visits No visits were found meeting these conditions. Showing recent visits within past 90 days and meeting all other requirements Today's Visits Date Type Provider Dept  04/15/23 Office Visit Renaldo Caroli, MD [facility]  Showing today's visits and meeting all other requirements Future Appointments No visits were found meeting these conditions. Showing future appointments within next 90 days and meeting all other requirements  I discussed the assessment and treatment plan with the patient. The patient was provided an opportunity to ask questions and all were answered. The patient agreed with the plan and demonstrated an understanding of the instructions.  Patient advised to call back or seek an in-person evaluation if the symptoms or condition worsens.  Duration of encounter: 20 minutes.  Note by: Candi Chafe, MD Date: 04/15/2023; Time: 12:21 PM

## 2023-04-15 NOTE — Patient Instructions (Signed)
 ______________________________________________________________________    Blood Thinners  IMPORTANT NOTICE:  If you take any of these, make sure to notify the nursing staff.  Failure to do so may result in serious injury.  Recommended time intervals to stop and restart blood-thinners, before & after invasive procedures  Generic Name Brand Name Pre-procedure: Stop medication for this amount of time before your procedure: Post-procedure: Wait this amount of time after the procedure before restarting your medication:  Abciximab Reopro 15 days 2 hrs  Alteplase Activase 10 days 10 days  Anagrelide Agrylin    Apixaban Eliquis 3 days 6 hrs  Cilostazol Pletal 3 days 5 hrs  Clopidogrel Plavix 7-10 days 2 hrs  Dabigatran Pradaxa 5 days 6 hrs  Dalteparin Fragmin 24 hours 4 hrs  Dipyridamole Aggrenox 11days 2 hrs  Edoxaban Lixiana; Savaysa 3 days 2 hrs  Enoxaparin  Lovenox 24 hours 4 hrs  Eptifibatide Integrillin 8 hours 2 hrs  Fondaparinux  Arixtra 72 hours 12 hrs  Hydroxychloroquine Plaquenil 11 days   Prasugrel Effient 7-10 days 6 hrs  Reteplase Retavase 10 days 10 days  Rivaroxaban Xarelto 3 days 6 hrs  Ticagrelor Brilinta 5-7 days 6 hrs  Ticlopidine Ticlid 10-14 days 2 hrs  Tinzaparin Innohep 24 hours 4 hrs  Tirofiban Aggrastat 8 hours 2 hrs  Warfarin Coumadin 5 days 2 hrs   Other medications with blood-thinning effects  NOTE: Consider stopping these if you have prolonged bleeding despite not taking any of the above blood thinners. Otherwise ask your provider and this will be decided on a case-by-case basis.  Product indications Generic (Brand) names Note  Cholesterol Lipitor Stop 4 days before procedure  Blood thinner (injectable) Heparin (LMW or LMWH Heparin) Stop 24 hours before procedure  Cancer Ibrutinib (Imbruvica) Stop 7 days before procedure  Malaria/Rheumatoid Hydroxychloroquine (Plaquenil) Stop 11 days before procedure  Thrombolytics  10 days before or after procedures    Over-the-counter (OTC) Products with blood-thinning effects  NOTE: Consider stopping these if you have prolonged bleeding despite not taking any of the above blood thinners. Otherwise ask your provider and this will be decided on a case-by-case basis.  Product Common names Stop Time  Aspirin > 325 mg Goody Powders, Excedrin, etc. 11 days  Aspirin <= 81 mg  7 days  Fish oil  4 days  Garlic supplements  7 days  Ginkgo biloba  36 hours  Ginseng  24 hours  NSAIDs Ibuprofen, Naprosyn, etc. 3 days  Vitamin E  4 days   ______________________________________________________________________      ______________________________________________________________________    Procedure instructions  Stop blood-thinners  Do not eat or drink fluids (other than water) for 6 hours before your procedure  No water for 2 hours before your procedure  Take your blood pressure medicine with a sip of water  Arrive 30 minutes before your appointment  If sedation is planned, bring suitable driver. Pennie Banter, Benedetto Goad, & public transportation are NOT APPROVED)  Carefully read the "Preparing for your procedure" detailed instructions  If you have questions call us at [phone]  Procedure appointments are for procedures only. NO medication refills or new problem evaluations.   ______________________________________________________________________      ______________________________________________________________________    Preparing for your procedure  Appointments: If you think you may not be able to keep your appointment, call 24-48 hours in advance to cancel. We need time to make it available to others.  Procedure visits are for procedures only. During your procedure appointment there will be: NO Prescription Refills*. NO  medication changes or discussions*. NO discussion of disability issues*. NO unrelated pain problem evaluations*. NO evaluations to order other pain procedures*. *These will  be addressed at a separate and distinct evaluation encounter on the provider's evaluation schedule and not during procedure days.  Instructions: Food intake: Avoid eating anything solid for at least 8 hours prior to your procedure. Clear liquid intake: You may take clear liquids such as water up to 2 hours prior to your procedure. (No carbonated drinks. No soda.) Transportation: Unless otherwise stated by your physician, bring a driver. (Driver cannot be a Market researcher, Pharmacist, community, or any other form of public transportation.) Morning Medicines: Except for blood thinners, take all of your other morning medications with a sip of water. Make sure to take your heart and blood pressure medicines. If your blood pressure's lower number is above 100, the case will be rescheduled. Blood thinners: Make sure to stop your blood thinners as instructed.  If you take a blood thinner, but were not instructed to stop it, call our office [phone] and ask to talk to a nurse. Not stopping a blood thinner prior to certain procedures could lead to serious complications. Diabetics on insulin: Notify the staff so that you can be scheduled 1st case in the morning. If your diabetes requires high dose insulin, take only  of your normal insulin dose the morning of the procedure and notify the staff that you have done so. Preventing infections: Shower with an antibacterial soap the morning of your procedure.  Build-up your immune system: Take 1000 mg of Vitamin C with every meal (3 times a day) the day prior to your procedure. Antibiotics: Inform the nursing staff if you are taking any antibiotics or if you have any conditions that may require antibiotics prior to procedures. (Example: recent joint implants)   Pregnancy: If you are pregnant make sure to notify the nursing staff. Not doing so may result in injury to the fetus, including death.  Sickness: If you have a cold, fever, or any active infections, call and cancel or reschedule  your procedure. Receiving steroids while having an infection may result in complications. Arrival: You must be in the facility at least 30 minutes prior to your scheduled procedure. Tardiness: Your scheduled time is also the cutoff time. If you do not arrive at least 15 minutes prior to your procedure, you will be rescheduled.  Children: Do not bring any children with you. Make arrangements to keep them home. Dress appropriately: There is always a possibility that your clothing may get soiled. Avoid long dresses. Valuables: Do not bring any jewelry or valuables.  Reasons to call and reschedule or cancel your procedure: (Following these recommendations will minimize the risk of a serious complication.) Surgeries: Avoid having procedures within 2 weeks of any surgery. (Avoid for 2 weeks before or after any surgery). Flu Shots: Avoid having procedures within 2 weeks of a flu shots or . (Avoid for 2 weeks before or after immunizations). Barium: Avoid having a procedure within 7-10 days after having had a radiological study involving the use of radiological contrast. (Myelograms, Barium swallow or enema study). Heart attacks: Avoid any elective procedures or surgeries for the initial 6 months after a "Myocardial Infarction" (Heart Attack). Blood thinners: It is imperative that you stop these medications before procedures. Let us know if you if you take any blood thinner.  Infection: Avoid procedures during or within two weeks of an infection (including chest colds or gastrointestinal problems). Symptoms associated with infections include:  Localized redness, fever, chills, night sweats or profuse sweating, burning sensation when voiding, cough, congestion, stuffiness, runny nose, sore throat, diarrhea, nausea, vomiting, cold or Flu symptoms, recent or current infections. It is specially important if the infection is over the area that we intend to treat. Heart and lung problems: Symptoms that may suggest an  active cardiopulmonary problem include: cough, chest pain, breathing difficulties or shortness of breath, dizziness, ankle swelling, uncontrolled high or unusually low blood pressure, and/or palpitations. If you are experiencing any of these symptoms, cancel your procedure and contact your primary care physician for an evaluation.  Remember:  Regular Business hours are:  Monday to Thursday 8:00 AM to 4:00 PM  Provider's Schedule: Delano Metz, MD:  Procedure days: Tuesday and Thursday 7:30 AM to 4:00 PM  Edward Jolly, MD:  Procedure days: Monday and Wednesday 7:30 AM to 4:00 PM Last  Updated: 12/11/2022 ______________________________________________________________________      ______________________________________________________________________    General Risks and Possible Complications  Patient Responsibilities: It is important that you read this as it is part of your informed consent. It is our duty to inform you of the risks and possible complications associated with treatments offered to you. It is your responsibility as a patient to read this and to ask questions about anything that is not clear or that you believe was not covered in this document.  Patient's Rights: You have the right to refuse treatment. You also have the right to change your mind, even after initially having agreed to have the treatment done. However, under this last option, if you wait until the last second to change your mind, you may be charged for the materials used up to that point.  Introduction: Medicine is not an Visual merchandiser. Everything in Medicine, including the lack of treatment(s), carries the potential for danger, harm, or loss (which is by definition: Risk). In Medicine, a complication is a secondary problem, condition, or disease that can aggravate an already existing one. All treatments carry the risk of possible complications. The fact that a side effects or complications occurs, does not  imply that the treatment was conducted incorrectly. It must be clearly understood that these can happen even when everything is done following the highest safety standards.  No treatment: You can choose not to proceed with the proposed treatment alternative. The "PRO(s)" would include: avoiding the risk of complications associated with the therapy. The "CON(s)" would include: not getting any of the treatment benefits. These benefits fall under one of three categories: diagnostic; therapeutic; and/or palliative. Diagnostic benefits include: getting information which can ultimately lead to improvement of the disease or symptom(s). Therapeutic benefits are those associated with the successful treatment of the disease. Finally, palliative benefits are those related to the decrease of the primary symptoms, without necessarily curing the condition (example: decreasing the pain from a flare-up of a chronic condition, such as incurable terminal cancer).  General Risks and Complications: These are associated to most interventional treatments. They can occur alone, or in combination. They fall under one of the following six (6) categories: no benefit or worsening of symptoms; bleeding; infection; nerve damage; allergic reactions; and/or death. No benefits or worsening of symptoms: In Medicine there are no guarantees, only probabilities. No healthcare provider can ever guarantee that a medical treatment will work, they can only state the probability that it may. Furthermore, there is always the possibility that the condition may worsen, either directly, or indirectly, as a consequence of the treatment. Bleeding: This is  more common if the patient is taking a blood thinner, either prescription or over the counter (example: Goody Powders, Fish oil, Aspirin, Garlic, etc.), or if suffering a condition associated with impaired coagulation (example: Hemophilia, cirrhosis of the liver, low platelet counts, etc.). However, even  if you do not have one on these, it can still happen. If you have any of these conditions, or take one of these drugs, make sure to notify your treating physician. Infection: This is more common in patients with a compromised immune system, either due to disease (example: diabetes, cancer, human immunodeficiency virus [HIV], etc.), or due to medications or treatments (example: therapies used to treat cancer and rheumatological diseases). However, even if you do not have one on these, it can still happen. If you have any of these conditions, or take one of these drugs, make sure to notify your treating physician. Nerve Damage: This is more common when the treatment is an invasive one, but it can also happen with the use of medications, such as those used in the treatment of cancer. The damage can occur to small secondary nerves, or to large primary ones, such as those in the spinal cord and brain. This damage may be temporary or permanent and it may lead to impairments that can range from temporary numbness to permanent paralysis and/or brain death. Allergic Reactions: Any time a substance or material comes in contact with our body, there is the possibility of an allergic reaction. These can range from a mild skin rash (contact dermatitis) to a severe systemic reaction (anaphylactic reaction), which can result in death. Death: In general, any medical intervention can result in death, most of the time due to an unforeseen complication. ______________________________________________________________________

## 2023-04-16 ENCOUNTER — Telehealth: Payer: Self-pay

## 2023-04-16 NOTE — Telephone Encounter (Signed)
 Spoke with patient and reviewed pre-procedure instructions. Informed patient date and time of scheduled procedure.

## 2023-05-07 ENCOUNTER — Ambulatory Visit
Admission: RE | Admit: 2023-05-07 | Discharge: 2023-05-07 | Disposition: A | Discharge status: Home / Self Care | Source: Ambulatory Visit | Attending: Pain Medicine | Admitting: Pain Medicine

## 2023-05-07 ENCOUNTER — Ambulatory Visit: Attending: Pain Medicine | Admitting: Pain Medicine

## 2023-05-07 ENCOUNTER — Encounter: Payer: Self-pay | Admitting: Pain Medicine

## 2023-05-07 VITALS — BP 163/90 | HR 85 | Temp 97.2°F | Resp 20 | Ht 76.0 in | Wt 213.0 lb

## 2023-05-07 DIAGNOSIS — M51362 Other intervertebral disc degeneration, lumbar region with discogenic back pain and lower extremity pain: Secondary | ICD-10-CM | POA: Insufficient documentation

## 2023-05-07 DIAGNOSIS — M48062 Spinal stenosis, lumbar region with neurogenic claudication: Secondary | ICD-10-CM | POA: Insufficient documentation

## 2023-05-07 DIAGNOSIS — R200 Anesthesia of skin: Secondary | ICD-10-CM | POA: Insufficient documentation

## 2023-05-07 DIAGNOSIS — M5136 Other intervertebral disc degeneration, lumbar region with discogenic back pain only: Secondary | ICD-10-CM | POA: Insufficient documentation

## 2023-05-07 DIAGNOSIS — M79605 Pain in left leg: Secondary | ICD-10-CM | POA: Insufficient documentation

## 2023-05-07 DIAGNOSIS — M5417 Radiculopathy, lumbosacral region: Secondary | ICD-10-CM | POA: Insufficient documentation

## 2023-05-07 DIAGNOSIS — R202 Paresthesia of skin: Secondary | ICD-10-CM | POA: Diagnosis present

## 2023-05-07 DIAGNOSIS — G8929 Other chronic pain: Secondary | ICD-10-CM | POA: Diagnosis present

## 2023-05-07 DIAGNOSIS — M4316 Spondylolisthesis, lumbar region: Secondary | ICD-10-CM | POA: Insufficient documentation

## 2023-05-07 DIAGNOSIS — M5442 Lumbago with sciatica, left side: Secondary | ICD-10-CM

## 2023-05-07 DIAGNOSIS — Z7901 Long term (current) use of anticoagulants: Secondary | ICD-10-CM | POA: Diagnosis present

## 2023-05-07 DIAGNOSIS — M48061 Spinal stenosis, lumbar region without neurogenic claudication: Secondary | ICD-10-CM | POA: Diagnosis present

## 2023-05-07 DIAGNOSIS — M4696 Unspecified inflammatory spondylopathy, lumbar region: Secondary | ICD-10-CM | POA: Diagnosis present

## 2023-05-07 MED ORDER — ROPIVACAINE HCL 2 MG/ML IJ SOLN
2.0000 mL | Freq: Once | INTRAMUSCULAR | Status: AC
Start: 2023-05-07 — End: 2023-05-07
  Administered 2023-05-07: 2 mL via EPIDURAL

## 2023-05-07 MED ORDER — TRIAMCINOLONE ACETONIDE 40 MG/ML IJ SUSP
40.0000 mg | Freq: Once | INTRAMUSCULAR | Status: AC
  Administered 2023-05-07: 40 mg

## 2023-05-07 MED ORDER — SODIUM CHLORIDE 0.9% FLUSH
2.0000 mL | Freq: Once | INTRAVENOUS | Status: AC
Start: 2023-05-07 — End: 2023-05-07
  Administered 2023-05-07: 2 mL

## 2023-05-07 MED ORDER — ROPIVACAINE HCL 2 MG/ML IJ SOLN
INTRAMUSCULAR | Status: AC
  Filled 2023-05-07: qty 20

## 2023-05-07 MED ORDER — SODIUM CHLORIDE (PF) 0.9 % IJ SOLN
INTRAMUSCULAR | Status: AC
  Filled 2023-05-07: qty 10

## 2023-05-07 MED ORDER — LIDOCAINE HCL (PF) 2 % IJ SOLN
INTRAMUSCULAR | Status: AC
  Filled 2023-05-07: qty 10

## 2023-05-07 MED ORDER — MIDAZOLAM HCL 2 MG/2ML IJ SOLN: 0.5000 mg | Freq: Once | INTRAMUSCULAR | Status: DC

## 2023-05-07 MED ORDER — MIDAZOLAM HCL 5 MG/5ML IJ SOLN
INTRAMUSCULAR | Status: AC
  Filled 2023-05-07: qty 5

## 2023-05-07 MED ORDER — IOHEXOL 180 MG/ML  SOLN
INTRAMUSCULAR | Status: AC
  Filled 2023-05-07: qty 20

## 2023-05-07 MED ORDER — LIDOCAINE HCL 2 % IJ SOLN
INTRAMUSCULAR | Status: AC
Start: 2023-05-07 — End: ?
  Filled 2023-05-07: qty 20

## 2023-05-07 MED ORDER — IOHEXOL 180 MG/ML  SOLN
10.0000 mL | Freq: Once | INTRAMUSCULAR | Status: AC
Start: 2023-05-07 — End: 2023-05-07
  Administered 2023-05-07: 10 mL via EPIDURAL

## 2023-05-07 MED ORDER — IOHEXOL 180 MG/ML  SOLN
INTRAMUSCULAR | Status: AC
  Filled 2023-05-07: qty 10

## 2023-05-07 MED ORDER — TRIAMCINOLONE ACETONIDE 40 MG/ML IJ SUSP
INTRAMUSCULAR | Status: AC
Start: 2023-05-07 — End: ?
  Filled 2023-05-07: qty 1

## 2023-05-07 MED ORDER — PENTAFLUOROPROP-TETRAFLUOROETH EX AERO
INHALATION_SPRAY | Freq: Once | CUTANEOUS | Status: AC
  Administered 2023-05-07: 30 via TOPICAL

## 2023-05-07 MED ORDER — TRIAMCINOLONE ACETONIDE 40 MG/ML IJ SUSP
INTRAMUSCULAR | Status: AC
  Filled 2023-05-07: qty 1

## 2023-05-07 MED ORDER — FENTANYL CITRATE (PF) 100 MCG/2ML IJ SOLN
INTRAMUSCULAR | Status: AC
  Filled 2023-05-07: qty 2

## 2023-05-07 MED ORDER — LIDOCAINE HCL 2 % IJ SOLN
20.0000 mL | Freq: Once | INTRAMUSCULAR | Status: AC
  Administered 2023-05-07: 100 mg

## 2023-05-07 NOTE — Progress Notes (Signed)
 PROVIDER NOTE: Interpretation of information contained herein should be left to medically-trained personnel. Specific patient instructions are provided elsewhere under "Patient Instructions" section of medical record. This document was created in part using STT-dictation technology, any transcriptional errors that may result from this process are unintentional.  Patient: Todd Bradford Type: Established DOB: 04/18/1950 MRN: [id number] PCP: Sabra Cramp, NP  Service: Procedure DOS: 05/07/2023 Setting: Ambulatory Location: Ambulatory outpatient facility Delivery: Face-to-face Provider: Candi Chafe, MD Specialty: Interventional Pain Management Specialty designation: 09 Location: Outpatient facility Ref. Prov.: Sabra Cramp, NP       Interventional Therapy   Type: Lumbar epidural steroid injection (LESI) (interlaminar) #2    Laterality: Midline   Level:  L3-4 Level.  Imaging: Fluoroscopic guidance Spinal ([id number]) Anesthesia: Local anesthesia (1-2% Lidocaine ) Anxiolysis: None                 Sedation: No Sedation                       DOS: 05/07/2023  Performed by: Candi Chafe, MD  Purpose: Diagnostic/Therapeutic Indications: Lumbar radicular pain of intraspinal etiology of more than 4 weeks that has failed to respond to conservative therapy and is severe enough to impact quality of life or function. 1. 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 Chronic lower extremity pain (2ry area of Pain) (Left)   3. 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 Inflammatory spondylopathy of lumbar region [facility])  (Right: L3-4)   5. 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 Lumbar lateral recess stenosis  (Right: L3-4)   9. Lumbosacral radiculopathy at L5 (Bilateral)   10. Lumbosacral  radiculopathy at S1 (Bilateral)   11. Numbness and tingling of lower extremity (Left)   12. Degeneration of intervertebral disc of lumbar region with discogenic back pain and lower extremity pain   13. Chronic anticoagulation (Xarelto )    NAS-11 Pain score:   Pre-procedure: 3 /10   Post-procedure: 3 /10      Position / Prep / Materials:  Position: Prone w/ head of the table raised (slight reverse trendelenburg) to facilitate breathing.  Prep solution: ChloraPrep (2% chlorhexidine gluconate and 70% isopropyl alcohol) Prep Area: Entire Posterior Lumbar Region from lower scapular tip down to mid buttocks area and from flank to flank. Materials:  Tray: Epidural tray Needle(s):  Type: Epidural needle (Tuohy) Gauge (G):  17 Length: Regular (3.5-in) Qty: 1  H&P (Pre-op Assessment):  Todd Bradford is a 73 y.o. (year old), male patient, seen today for interventional treatment. He  has a past surgical history that includes Neck surgery (2000 approx); Spine surgery; and Prostate surgery (04/2022). Todd Bradford has a current medication list which includes the following prescription(s): vitamin c, atorvastatin, cyclobenzaprine, finasteride, ibuprofen, rivaroxaban , tramadol, and triamcinolone  ointment. His primarily concern today is the Back Pain (lower)  Initial Vital Signs:  Pulse/HCG Rate: 85ECG Heart Rate: 79 Temp: (!) 97.2 F (36.2 C) Resp: 16 BP: (!) 165/90 SpO2: 100 %  BMI: Estimated body mass index is 25.93 kg/m as calculated from the following:   Height as of this encounter: 6\' 4"  (1.93 m).   Weight as of this encounter: 213 lb (96.6 kg).  Risk Assessment: Allergies: Reviewed. He has no known allergies.  Allergy Precautions: None  required Coagulopathies: Reviewed. None identified.  Blood-thinner therapy: None at this time Active Infection(s): Reviewed. None identified. Todd Bradford is afebrile  Site Confirmation: Todd Bradford was asked to confirm the procedure and laterality before  marking the site Procedure checklist: Completed Consent: Before the procedure and under the influence of no sedative(s), amnesic(s), or anxiolytics, the patient was informed of the treatment options, risks and possible complications. To fulfill our ethical and legal obligations, as recommended by the American Medical Association's Code of Ethics, I have informed the patient of my clinical impression; the nature and purpose of the treatment or procedure; the risks, benefits, and possible complications of the intervention; the alternatives, including doing nothing; the risk(s) and benefit(s) of the alternative treatment(s) or procedure(s); and the risk(s) and benefit(s) of doing nothing. The patient was provided information about the general risks and possible complications associated with the procedure. These may include, but are not limited to: failure to achieve desired goals, infection, bleeding, organ or nerve damage, allergic reactions, paralysis, and death. In addition, the patient was informed of those risks and complications associated to Spine-related procedures, such as failure to decrease pain; infection (i.e.: Meningitis, epidural or intraspinal abscess); bleeding (i.e.: epidural hematoma, subarachnoid hemorrhage, or any other type of intraspinal or peri-dural bleeding); organ or nerve damage (i.e.: Any type of peripheral nerve, nerve root, or spinal cord injury) with subsequent damage to sensory, motor, and/or autonomic systems, resulting in permanent pain, numbness, and/or weakness of one or several areas of the body; allergic reactions; (i.e.: anaphylactic reaction); and/or death. Furthermore, the patient was informed of those risks and complications associated with the medications. These include, but are not limited to: allergic reactions (i.e.: anaphylactic or anaphylactoid reaction(s)); adrenal axis suppression; blood sugar elevation that in diabetics may result in ketoacidosis or comma; water  retention that in patients with history of congestive heart failure may result in shortness of breath, pulmonary edema, and decompensation with resultant heart failure; weight gain; swelling or edema; medication-induced neural toxicity; particulate matter embolism and blood vessel occlusion with resultant organ, and/or nervous system infarction; and/or aseptic necrosis of one or more joints. Finally, the patient was informed that Medicine is not an exact science; therefore, there is also the possibility of unforeseen or unpredictable risks and/or possible complications that may result in a catastrophic outcome. The patient indicated having understood very clearly. We have given the patient no guarantees and we have made no promises. Enough time was given to the patient to ask questions, all of which were answered to the patient's satisfaction. Mr. Fowle has indicated that he wanted to continue with the procedure. Attestation: I, the ordering provider, attest that I have discussed with the patient the benefits, risks, side-effects, alternatives, likelihood of achieving goals, and potential problems during recovery for the procedure that I have provided informed consent. Date  Time: 05/07/2023  8:07 AM  Pre-Procedure Preparation:  Monitoring: As per clinic protocol. Respiration, ETCO2, SpO2, BP, heart rate and rhythm monitor placed and checked for adequate function Safety Precautions: Patient was assessed for positional comfort and pressure points before starting the procedure. Time-out: I initiated and conducted the "Time-out" before starting the procedure, as per protocol. The patient was asked to participate by confirming the accuracy of the "Time Out" information. Verification of the correct person, site, and procedure were performed and confirmed by me, the nursing staff, and the patient. "Time-out" conducted as per Joint Commission's Universal Protocol (UP.01.01.01). Time: 0841 Start Time: 0841  hrs.  Description/Narrative of Procedure:  Target: Epidural space via interlaminar opening, initially targeting the lower laminar border of the superior vertebral body. Region: Lumbar Approach: Percutaneous paravertebral  Rationale (medical necessity): procedure needed and proper for the diagnosis and/or treatment of the patient's medical symptoms and needs. Procedural Technique Safety Precautions: Aspiration looking for blood return was conducted prior to all injections. At no point did we inject any substances, as a needle was being advanced. No attempts were made at seeking any paresthesias. Safe injection practices and needle disposal techniques used. Medications properly checked for expiration dates. SDV (single dose vial) medications used. Description of the Procedure: Protocol guidelines were followed. The procedure needle was introduced through the skin, ipsilateral to the reported pain, and advanced to the target area. Bone was contacted and the needle walked caudad, until the lamina was cleared. The epidural space was identified using "loss-of-resistance technique" with 2-3 ml of PF-NaCl (0.9% NSS), in a 5cc LOR glass syringe.  Vitals:   05/07/23 0830 05/07/23 0840 05/07/23 0845 05/07/23 0847  BP: (!) 166/96 (!) 172/83 (!) 165/87 (!) 163/90  Pulse:      Resp: 18 17 18 20   Temp:      TempSrc:      SpO2: 100% 100% 100% 100%  Weight:      Height:        Start Time: 0841 hrs. End Time: 0846 hrs.  Imaging Guidance (Spinal):          Type of Imaging Technique: Fluoroscopy Guidance (Spinal) Indication(s): Fluoroscopy guidance for needle placement to enhance accuracy in procedures requiring precise needle localization for targeted delivery of medication in or near specific anatomical locations not easily accessible without such real-time imaging assistance. Exposure Time: Please see nurses notes. Contrast: Before injecting any contrast, we confirmed that the patient did not  have an allergy to iodine, shellfish, or radiological contrast. Once satisfactory needle placement was completed at the desired level, radiological contrast was injected. Contrast injected under live fluoroscopy. No contrast complications. See chart for type and volume of contrast used. Fluoroscopic Guidance: I was personally present during the use of fluoroscopy. "Tunnel Vision Technique" used to obtain the best possible view of the target area. Parallax error corrected before commencing the procedure. "Direction-depth-direction" technique used to introduce the needle under continuous pulsed fluoroscopy. Once target was reached, antero-posterior, oblique, and lateral fluoroscopic projection used confirm needle placement in all planes. Images permanently stored in EMR. Interpretation: I personally interpreted the imaging intraoperatively. Adequate needle placement confirmed in multiple planes. Appropriate spread of contrast into desired area was observed. No evidence of afferent or efferent intravascular uptake. No intrathecal or subarachnoid spread observed. Permanent images saved into the patient's record.  Antibiotic Prophylaxis:   Anti-infectives (From admission, onward)    None      Indication(s): None identified  Post-operative Assessment:  Post-procedure Vital Signs:  Pulse/HCG Rate: 8584 Temp: (!) 97.2 F (36.2 C) Resp: 20 BP: (!) 163/90 SpO2: 100 %  EBL: None  Complications: No immediate post-treatment complications observed by team, or reported by patient.  Note: The patient tolerated the entire procedure well. A repeat set of vitals were taken after the procedure and the patient was kept under observation following institutional policy, for this type of procedure. Post-procedural neurological assessment was performed, showing return to baseline, prior to discharge. The patient was provided with post-procedure discharge instructions, including a section on how to identify  potential problems. Should any problems arise concerning this procedure, the patient was given instructions to immediately contact us , at  any time, without hesitation. In any case, we plan to contact the patient by telephone for a follow-up status report regarding this interventional procedure.  Comments:  No additional relevant information.  Plan of Care (POC)  Orders:  Orders Placed This Encounter  Procedures   Lumbar Epidural Injection    Scheduling Instructions:     Procedure: Interlaminar LESI L3-4     Laterality: Midline     Sedation: Patient's choice     Timeframe: Today    Where will this procedure be performed?:   [facility]   [facility] C-ARM 1-60 MIN NO REPORT    Intraoperative interpretation by procedural physician at [facility].    Standing Status:   Standing    Number of Occurrences:   1    Reason for exam::   Assistance in needle guidance and placement for procedures requiring needle placement in or near specific anatomical locations not easily accessible without such assistance.   Informed Consent Details: Physician/Practitioner Attestation; Transcribe to consent form and obtain patient signature    Note: Always confirm laterality of pain with Mr. Franzel, before procedure. Transcribe to consent form and obtain patient signature.    Physician/Practitioner attestation of informed consent for procedure/surgical case:   I, the physician/practitioner, attest that I have discussed with the patient the benefits, risks, side effects, alternatives, likelihood of achieving goals and potential problems during recovery for the procedure that I have provided informed consent.    Procedure:   Lumbar epidural steroid injection under fluoroscopic guidance    Physician/Practitioner performing the procedure:   Taray Normoyle A. Barth Borne, MD    Indication/Reason:   Low back and/or lower extremity pain secondary to lumbar radiculitis   Care order/instruction: Please confirm  that the patient has stopped the Xarelto  (Rivaroxaban ) x 3 days prior to procedure or surgery.    Please confirm that the patient has stopped the Xarelto  (Rivaroxaban ) x 3 days prior to procedure or surgery.    Standing Status:   Standing    Number of Occurrences:   1   Provide equipment / supplies at bedside    Procedural tray: Epidural Tray (Disposable  single use) Skin infiltration needle: Regular 1.5-in, 25-G, (x1) Block needle size: Regular standard Catheter: No catheter required    Standing Status:   Standing    Number of Occurrences:   1    Specify:   Epidural Tray   Bleeding precautions    Standing Status:   Standing    Number of Occurrences:   1   Chronic Opioid Analgesic:   No chronic opioid analgesics therapy prescribed by our practice. Tramadol 50 mg, half a tablet p.o. daily MME/day: 2.5 mg/day      Medications ordered for procedure: Meds ordered this encounter  Medications   iohexol  (OMNIPAQUE ) 180 MG/ML injection 10 mL    Must be Myelogram-compatible. If not available, you may substitute with a water-soluble, non-ionic, hypoallergenic, myelogram-compatible radiological contrast medium.   lidocaine  (XYLOCAINE ) 2 % (with pres) injection 400 mg   pentafluoroprop-tetrafluoroeth (GEBAUERS) aerosol   DISCONTD: midazolam  (VERSED ) injection 0.5-2 mg    Make sure Flumazenil is available in the pyxis when using this medication. If oversedation occurs, administer 0.2 mg IV over 15 sec. If after 45 sec no response, administer 0.2 mg again over 1 min; may repeat at 1 min intervals; not to exceed 4 doses (1 mg)   sodium chloride  flush (NS) 0.9 % injection 2 mL   ropivacaine  (PF) 2 mg/mL (0.2%) (  NAROPIN ) injection 2 mL   triamcinolone  acetonide (KENALOG -40) injection 40 mg   Medications administered: We administered iohexol , lidocaine , pentafluoroprop-tetrafluoroeth, sodium chloride  flush, ropivacaine  (PF) 2 mg/mL (0.2%), and triamcinolone  acetonide.  See the medical record for  exact dosing, route, and time of administration.  Follow-up plan:   Return in about 2 weeks (around 05/21/2023) for (VV), (PPE).       Interventional Therapies  Risk  Complexity Considerations:   Xarelto  ANTICOAGULATION: (Stop: 3 days  Restart: 6 hours)   Planned  Pending:   Therapeutic midline L3-4 LESI #2    Under consideration:   Diagnostic bilateral (L2-S1) lumbar facet MBB #1  Therapeutic midline L2-3 LESI #1  Therapeutic L2-3, L3-4, and L4-5 MILD procedure   Completed:   Therapeutic right L3-4 LESI x1 (12/04/2022) (100/100/100/100 for the right lower extremity pain that he was experiencing) Diagnostic/therapeutic left L3 & L4 TESI x1 (09/26/2021) (100/100/100/100 x 2 months) Therapeutic left L5 TFESI x2 (04/25/2021) (100/100/100/90-100)  Therapeutic left L5-S1 LESI x2 (08/30/2022) (100/100/70/ LBP:90  LLEP:100)  Therapeutic left L3-4 LESI x2 (12/20/2022) (100/90/65/100-65)  Therapeutic right L3-4 LESI x1 (04/25/2021) (100/100/100/90-100)    Therapeutic  Palliative (PRN) options:         Recent Visits Date Type Provider Dept  04/15/23 Office Visit Renaldo Caroli, MD [facility]  Showing recent visits within past 90 days and meeting all other requirements Today's Visits Date Type Provider Dept  05/07/23 Procedure visit Renaldo Caroli, MD [facility]  Showing today's visits and meeting all other requirements Future Appointments Date Type Provider Dept  05/21/23 Appointment Renaldo Caroli, MD [facility]  Showing future appointments within next 90 days and meeting all other requirements  Disposition: Discharge home  Discharge (Date  Time): 05/07/2023; 0855 hrs.   Primary Care Physician: Sabra Cramp, NP Location: [facility] Note by: Candi Chafe, MD (TTS technology used. I apologize for any typographical errors that were not detected and corrected.) Date: 05/07/2023; Time: 9:02  AM  Disclaimer:  Medicine is not an Visual merchandiser. The only guarantee in medicine is that nothing is guaranteed. It is important to note that the decision to proceed with this intervention was based on the information collected from the patient. The Data and conclusions were drawn from the patient's questionnaire, the interview, and the physical examination. Because the information was provided in large part by the patient, it cannot be guaranteed that it has not been purposely or unconsciously manipulated. Every effort has been made to obtain as much relevant data as possible for this evaluation. It is important to note that the conclusions that lead to this procedure are derived in large part from the available data. Always take into account that the treatment will also be dependent on availability of resources and existing treatment guidelines, considered by other Pain Management Practitioners as being common knowledge and practice, at the time of the intervention. For Medico-Legal purposes, it is also important to point out that variation in procedural techniques and pharmacological choices are the acceptable norm. The indications, contraindications, technique, and results of the above procedure should only be interpreted and judged by a Board-Certified Interventional Pain Specialist with extensive familiarity and expertise in the same exact procedure and technique.

## 2023-05-07 NOTE — Patient Instructions (Addendum)
 Epidural Steroid Injection Patient Information  Description: The epidural space surrounds the nerves as they exit the spinal cord.  In some patients, the nerves can be compressed and inflamed by a bulging disc or a tight spinal canal (spinal stenosis).  By injecting steroids into the epidural space, we can bring irritated nerves into direct contact with a potentially helpful medication.  These steroids act directly on the irritated nerves and can reduce swelling and inflammation which often leads to decreased pain.  Epidural steroids may be injected anywhere along the spine and from the neck to the low back depending upon the location of your pain.   After numbing the skin with local anesthetic (like Novocaine), a small needle is passed into the epidural space slowly.  You may experience a sensation of pressure while this is being done.  The entire block usually last less than 10 minutes.  Conditions which may be treated by epidural steroids:  Low back and leg pain Neck and arm pain Spinal stenosis Post-laminectomy syndrome Herpes zoster (shingles) pain Pain from compression fractures  Preparation for the injection:  Do not eat any solid food or dairy products within 8 hours of your appointment.  You may drink clear liquids up to 3 hours before appointment.  Clear liquids include water, black coffee, juice or soda.  No milk or cream please. You may take your regular medication, including pain medications, with a sip of water before your appointment  Diabetics should hold regular insulin (if taken separately) and take 1/2 normal NPH dos the morning of the procedure.  Carry some sugar containing items with you to your appointment. A driver must accompany you and be prepared to drive you home after your procedure.  Bring all your current medications with your. An IV may be inserted and sedation may be given at the discretion of the physician.   A blood pressure cuff, EKG and other monitors will  often be applied during the procedure.  Some patients may need to have extra oxygen administered for a short period. You will be asked to provide medical information, including your allergies, prior to the procedure.  We must know immediately if you are taking blood thinners (like Coumadin/Warfarin)  Or if you are allergic to IV iodine contrast (dye). We must know if you could possible be pregnant.  Possible side-effects: Bleeding from needle site Infection (rare, may require surgery) Nerve injury (rare) Numbness & tingling (temporary) Difficulty urinating (rare, temporary) Spinal headache ( a headache worse with upright posture) Light -headedness (temporary) Pain at injection site (several days) Decreased blood pressure (temporary) Weakness in arm/leg (temporary) Pressure sensation in back/neck (temporary)  Call if you experience: Fever/chills associated with headache or increased back/neck pain. Headache worsened by an upright position. New onset weakness or numbness of an extremity below the injection site Hives or difficulty breathing (go to the emergency room) Inflammation or drainage at the infection site Severe back/neck pain Any new symptoms which are concerning to you  Please note:  Although the local anesthetic injected can often make your back or neck feel good for several hours after the injection, the pain will likely return.  It takes 3-7 days for steroids to work in the epidural space.  You may not notice any pain relief for at least that one week.  If effective, we will often do a series of three injections spaced 3-6 weeks apart to maximally decrease your pain.  After the initial series, we generally will wait several months before  considering a repeat injection of the same type.  If you have any questions, please call [phone] [facility] ______________________________________________________________________    Post-Procedure  Discharge Instructions  Instructions: Apply ice:  Purpose: This will minimize any swelling and discomfort after procedure.  When: Day of procedure, as soon as you get home. How: Fill a plastic sandwich bag with crushed ice. Cover it with a small towel and apply to injection site. How long: (15 min on, 15 min off) Apply for 15 minutes then remove x 15 minutes.  Repeat sequence on day of procedure, until you go to bed. Apply heat:  Purpose: To treat any soreness and discomfort from the procedure. When: Starting the next day after the procedure. How: Apply heat to procedure site starting the day following the procedure. How long: May continue to repeat daily, until discomfort goes away. Food intake: Start with clear liquids (like water) and advance to regular food, as tolerated.  Physical activities: Keep activities to a minimum for the first 8 hours after the procedure. After that, then as tolerated. Driving: If you have received any sedation, be responsible and do not drive. You are not allowed to drive for 24 hours after having sedation. Blood thinner: (Applies only to those taking blood thinners) You may restart your blood thinner 6 hours after your procedure. Insulin: (Applies only to Diabetic patients taking insulin) As soon as you can eat, you may resume your normal dosing schedule. Infection prevention: Keep procedure site clean and dry. Shower daily and clean area with soap and water. Post-procedure Pain Diary: Extremely important that this be done correctly and accurately. Recorded information will be used to determine the next step in treatment. For the purpose of accuracy, follow these rules: Evaluate only the area treated. Do not report or include pain from an untreated area. For the purpose of this evaluation, ignore all other areas of pain, except for the treated area. After your procedure, avoid taking a long nap and attempting to complete the pain diary after you wake up. Instead,  set your alarm clock to go off every hour, on the hour, for the initial 8 hours after the procedure. Document the duration of the numbing medicine, and the relief you are getting from it. Do not go to sleep and attempt to complete it later. It will not be accurate. If you received sedation, it is likely that you were given a medication that may cause amnesia. Because of this, completing the diary at a later time may cause the information to be inaccurate. This information is needed to plan your care. Follow-up appointment: Keep your post-procedure follow-up evaluation appointment after the procedure (usually 2 weeks for most procedures, 6 weeks for radiofrequencies). DO NOT FORGET to bring you pain diary with you.   Expect: (What should I expect to see with my procedure?) From numbing medicine (AKA: Local Anesthetics): Numbness or decrease in pain. You may also experience some weakness, which if present, could last for the duration of the local anesthetic. Onset: Full effect within 15 minutes of injected. Duration: It will depend on the type of local anesthetic used. On the average, 1 to 8 hours.  From steroids (Applies only if steroids were used): Decrease in swelling or inflammation. Once inflammation is improved, relief of the pain will follow. Onset of benefits: Depends on the amount of swelling present. The more swelling, the longer it will take for the benefits to be seen. In some cases, up to 10  days. Duration: Steroids will stay in the system x 2 weeks. Duration of benefits will depend on multiple posibilities including persistent irritating factors. Side-effects: If present, they may typically last 2 weeks (the duration of the steroids). Frequent: Cramps (if they occur, drink Gatorade and take over-the-counter Magnesium 450-500 mg once to twice a day); water retention with temporary weight gain; increases in blood sugar; decreased immune system response; increased appetite. Occasional: Facial  flushing (red, warm cheeks); mood swings; menstrual changes. Uncommon: Long-term decrease or suppression of natural hormones; bone thinning. (These are more common with higher doses or more frequent use. This is why we prefer that our patients avoid having any injection therapies in other practices.)  Very Rare: Severe mood changes; psychosis; aseptic necrosis. From procedure: Some discomfort is to be expected once the numbing medicine wears off. This should be minimal if ice and heat are applied as instructed.  Call if: (When should I call?) You experience numbness and weakness that gets worse with time, as opposed to wearing off. New onset bowel or bladder incontinence. (Applies only to procedures done in the spine)  Emergency Numbers: Durning business hours (Monday - Thursday, 8:00 AM - 4:00 PM) (Friday, 9:00 AM - 12:00 Noon): [phone] After hours: [phone] NOTE: If you are having a problem and are unable connect with, or to talk to a provider, then go to your nearest urgent care or emergency department. If the problem is serious and urgent, please call 911. ______________________________________________________________________     ______________________________________________________________________    Blood Thinners  IMPORTANT NOTICE:  If you take any of these, make sure to notify the nursing staff.  Failure to do so may result in serious injury.  Recommended time intervals to stop and restart blood-thinners, before & after invasive procedures  Generic Name Brand Name Pre-procedure: Stop medication for this amount of time before your procedure: Post-procedure: Wait this amount of time after the procedure before restarting your medication:  Abciximab Reopro 15 days 2 hrs  Alteplase Activase 10 days 10 days  Anagrelide Agrylin    Apixaban Eliquis 3 days 6 hrs  Cilostazol Pletal 3 days 5 hrs  Clopidogrel Plavix 7-10 days 2 hrs  Dabigatran Pradaxa 5 days 6 hrs   Dalteparin Fragmin 24 hours 4 hrs  Dipyridamole Aggrenox 11days 2 hrs  Edoxaban Lixiana; Savaysa 3 days 2 hrs  Enoxaparin  Lovenox 24 hours 4 hrs  Eptifibatide Integrillin 8 hours 2 hrs  Fondaparinux  Arixtra 72 hours 12 hrs  Hydroxychloroquine Plaquenil 11 days   Prasugrel Effient 7-10 days 6 hrs  Reteplase Retavase 10 days 10 days  Rivaroxaban  Xarelto  3 days 6 hrs  Ticagrelor Brilinta 5-7 days 6 hrs  Ticlopidine Ticlid 10-14 days 2 hrs  Tinzaparin Innohep 24 hours 4 hrs  Tirofiban Aggrastat 8 hours 2 hrs  Warfarin Coumadin 5 days 2 hrs   Other medications with blood-thinning effects  NOTE: Consider stopping these if you have prolonged bleeding despite not taking any of the above blood thinners. Otherwise ask your provider and this will be decided on a case-by-case basis.  Product indications Generic (Brand) names Note  Cholesterol Lipitor Stop 4 days before procedure  Blood thinner (injectable) Heparin (LMW or LMWH Heparin) Stop 24 hours before procedure  Cancer Ibrutinib (Imbruvica) Stop 7 days before procedure  Malaria/Rheumatoid Hydroxychloroquine (Plaquenil) Stop 11 days before procedure  Thrombolytics  10 days before or after procedures   Over-the-counter (OTC) Products with blood-thinning effects  NOTE: Consider stopping these if you have  prolonged bleeding despite not taking any of the above blood thinners. Otherwise ask your provider and this will be decided on a case-by-case basis.  Product Common names Stop Time  Aspirin > 325 mg Goody Powders, Excedrin, etc. 11 days  Aspirin <= 81 mg  7 days  Fish oil  4 days  Garlic supplements  7 days  Ginkgo biloba  36 hours  Ginseng  24 hours  NSAIDs Ibuprofen, Naprosyn, etc. 3 days  Vitamin E  4 days   ______________________________________________________________________

## 2023-05-08 ENCOUNTER — Telehealth: Payer: Self-pay

## 2023-05-08 NOTE — Telephone Encounter (Signed)
Post procedure follow up.  Patient states he is doing well 

## 2023-05-20 ENCOUNTER — Telehealth: Payer: Self-pay

## 2023-05-20 NOTE — Telephone Encounter (Signed)
 Called for pre procedure questions.  LM to call office.

## 2023-05-20 NOTE — Progress Notes (Signed)
 PROVIDER NOTE: Interpretation of information contained herein should be left to medically-trained personnel. Specific patient instructions are provided elsewhere under "Patient Instructions" section of medical record. This document was created in part using AI and STT-dictation technology, any transcriptional errors that may result from this process are unintentional.  Patient: Todd Bradford  Service: E/M   PCP: Sabra Cramp, NP  DOB: 1950/10/13  DOS: 05/21/2023  Provider: Candi Chafe, MD  MRN: [id number]  Delivery: Virtual Visit  Specialty: Interventional Pain Management  Type: Established Patient  Setting: Ambulatory outpatient facility  Specialty designation: 09  Referring Prov.: Johna Myers Genevive Ket, NP  Location: Remote location       Virtual Encounter - Pain Management PROVIDER NOTE: Information contained herein reflects review and annotations entered in association with encounter. Interpretation of such information and data should be left to medically-trained personnel. Information provided to patient can be located elsewhere in the medical record under "Patient Instructions". Document created using STT-dictation technology, any transcriptional errors that may result from process are unintentional.    Contact & Pharmacy Preferred: [phone] Home: [phone] (home) Mobile: [phone] (mobile) E-mail: [email or web address]  [facility] - [place], [place] - [address] [place] [address] Phone: [phone] Fax: [phone]  [facility] - [place], [place] - [address] [place] [address] Phone: [phone] Fax: [phone]   Pre-screening  Todd Bradford offered "in-person" vs "virtual" encounter. He indicated preferring virtual for this encounter.   Reason COVID-19*  Social distancing based on CDC and AMA recommendations.   I contacted Todd Bradford on 05/21/2023 via telephone.      I clearly  identified myself as Candi Chafe, MD. I verified that I was speaking with the correct person using two identifiers (Name: Todd Bradford, and date of birth: May 12, 1950).  Consent I sought verbal advanced consent from Jonel Burnsed for virtual visit interactions. I informed Todd Bradford of possible security and privacy concerns, risks, and limitations associated with providing "not-in-person" medical evaluation and management services. I also informed Todd Bradford of the availability of "in-person" appointments. Finally, I informed him that there would be a charge for the virtual visit and that he could be  personally, fully or partially, financially responsible for it. Todd Bradford expressed understanding and agreed to proceed.   Historic Elements   Todd Bradford is a 73 y.o. year old, male patient evaluated today after our last contact on 05/07/2023. Todd Bradford  has a past medical history of Hyperlipidemia. He also  has a past surgical history that includes Neck surgery (2000 approx); Spine surgery; and Prostate surgery (04/2022). Todd Bradford has a current medication list which includes the following prescription(s): vitamin c, atorvastatin, cyclobenzaprine, finasteride, ibuprofen, rivaroxaban , tramadol, and triamcinolone  ointment. He  reports that he quit smoking about 18 years ago. His smoking use included cigarettes. He started smoking about 48 years ago. He has a 60 pack-year smoking history. He has never used smokeless tobacco. No history on file for alcohol use and drug use. Mr. Wyss has no known allergies.  BMI: Estimated body mass index is 25.93 kg/m as calculated from the following:   Height as of 05/07/23: 6\' 4"  (1.93 m).   Weight as of 05/07/23: 213 lb (96.6 kg). Last encounter: 04/15/2023. Last procedure: 05/07/2023.  HPI  Today, he is being contacted for a post-procedure assessment.  Post-procedure evaluation   Type: Lumbar epidural steroid injection (LESI) (interlaminar) #2  Laterality:  Midline   Level:  L3-4 Level.  Imaging: Fluoroscopic guidance Spinal ([id number]) Anesthesia: Local anesthesia (1-2% Lidocaine ) Anxiolysis: None                 Sedation: No Sedation                       DOS: 05/07/2023  Performed by: Candi Chafe, MD  Purpose: Diagnostic/Therapeutic Indications: Lumbar radicular pain of intraspinal etiology of more than 4 weeks that has failed to respond to conservative therapy and is severe enough to impact quality of life or function. 1. 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 Chronic lower extremity pain (2ry area of Pain) (Left)   3. 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 Inflammatory spondylopathy of lumbar region [facility])  (Right: L3-4)   5. 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 Lumbar lateral recess stenosis  (Right: L3-4)   9. Lumbosacral radiculopathy at L5 (Bilateral)   10. Lumbosacral radiculopathy at S1 (Bilateral)   11. Numbness and tingling of lower extremity (Left)   12. Degeneration of intervertebral disc of lumbar region with discogenic back pain and lower extremity pain   13. Chronic anticoagulation (Xarelto )    NAS-11 Pain score:   Pre-procedure: 3 /10   Post-procedure: 3 /10    Effectiveness:  Initial hour after procedure:   100%. Subsequent 4-6 hours post-procedure:   100%. Analgesia past initial 6 hours:   100%. Ongoing improvement:  Analgesic: The patient indicates having attained 100% relief of the pain for the duration of the local anesthetic which she in fact has extended and it is ongoing. Function: Todd Bradford reports improvement in function ROM: Todd Bradford reports improvement in ROM   Pharmacotherapy Assessment  Opioid Analgesic:  No chronic opioid analgesics therapy prescribed by our practice. Tramadol 50 mg,  half a tablet p.o. daily MME/day: 2.5 mg/day     Monitoring: [place] PMP: PDMP reviewed during this encounter.       Pharmacotherapy: No side-effects or adverse reactions reported. Compliance: No problems identified. Effectiveness: Clinically acceptable. Plan: Refer to "POC". UDS:  Summary  Date Value Ref Range Status  02/16/2020 FINAL  Final    Comment:    ==================================================================== [facility] COMP DRUG ANALYSIS,UR ==================================================================== Test                             Result       Flag       Units Drug Present and Declared for Prescription Verification   Tramadol                       5215         EXPECTED   ng/mg creat   O-Desmethyltramadol            6226         EXPECTED   ng/mg creat   N-Desmethyltramadol            1269         EXPECTED   ng/mg creat    Source of tramadol is a prescription medication.    O-desmethyltramadol and N-desmethyltramadol are expected    metabolites of tramadol.    Cyclobenzaprine  PRESENT      EXPECTED   Desmethylcyclobenzaprine       PRESENT      EXPECTED    Desmethylcyclobenzaprine is an expected metabolite of    cyclobenzaprine.  Drug Present not Declared for Prescription Verification   Ephedrine/Pseudoephedrine      PRESENT      UNEXPECTED   Phenylpropanolamine            PRESENT      UNEXPECTED    Source of ephedrine/pseudoephedrine is most commonly    pseudoephedrine in over-the-counter or prescription cold and    allergy medications. Phenylpropanolamine is an expected    metabolite of ephedrine/pseudoephedrine.    Orphenadrine                    PRESENT      UNEXPECTED   Ibuprofen                      PRESENT      UNEXPECTED   Diphenhydramine                PRESENT      UNEXPECTED ==================================================================== Test                      Result    Flag   Units      Ref Range   Creatinine              74                mg/dL      >=16 ==================================================================== Declared Medications:  The flagging and interpretation on this report are based on the  following declared medications.  Unexpected results may arise from  inaccuracies in the declared medications.   **Note: The testing scope of this panel includes these medications:   Cyclobenzaprine (Flexeril)  Tramadol (Ultram)   **Note: The testing scope of this panel does not include following  reported medications:   Atorvastatin (Lipitor)  Finasteride (Proscar)  Tamsulosin (Flomax) ==================================================================== For clinical consultation, please call [phone]. ====================================================================    No results found for: "CBDTHCR", "D8THCCBX", "D9THCCBX"   Laboratory Chemistry Profile   Renal Lab Results  Component Value Date   BUN 26 (H) 03/01/2020   CREATININE 1.24 03/01/2020   BCR 18 02/15/2020   GFRAA 64 02/15/2020   GFRNONAA >60 03/01/2020    Hepatic Lab Results  Component Value Date   AST 45 (H) 03/01/2020   ALT 77 (H) 03/01/2020   ALBUMIN 3.6 03/01/2020   ALKPHOS 161 (H) 03/01/2020    Electrolytes Lab Results  Component Value Date   NA 136 03/01/2020   K 4.3 03/01/2020   CL 100 03/01/2020   CALCIUM 8.9 03/01/2020   MG 2.3 02/15/2020    Bone Lab Results  Component Value Date   25OHVITD1 37 02/15/2020   25OHVITD2 <1.0 02/15/2020   25OHVITD3 37 02/15/2020    Inflammation (CRP: Acute Phase) (ESR: Chronic Phase) Lab Results  Component Value Date   CRP 9 02/15/2020   ESRSEDRATE 19 02/15/2020         Note: Above Lab results reviewed.  Imaging   DG PAIN CLINIC C-ARM 1-60 MIN NO REPORT Fluoro was used, but no Radiologist interpretation will be provided.  Please refer to "NOTES" tab for provider progress note.  Assessment  The primary encounter diagnosis was Chronic low back pain  (1ry area of Pain) (Left) w/ sciatica (Left). 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  Plan of Care  Problem-specific:  No problem-specific Assessment & Plan notes found for this encounter.  Todd Bradford is not on any long-term medications.  Pharmacotherapy (Medications Ordered): No orders of the defined types were placed in this encounter.  Orders:  Orders Placed This Encounter  Procedures   Nursing Instructions:    Please complete this patient's postprocedure evaluation.    Scheduling Instructions:     Please complete this patient's postprocedure evaluation.   Follow-up plan:   No follow-ups on file.      Interventional Therapies  Risk  Complexity Considerations:   Xarelto  ANTICOAGULATION: (Stop: 3 days  Restart: 6 hours)   Planned  Pending:   Therapeutic midline L3-4 LESI #2    Under consideration:   Diagnostic bilateral (L2-S1) lumbar facet MBB #1  Therapeutic midline L2-3 LESI #1  Therapeutic L2-3, L3-4, and L4-5 MILD procedure   Completed:   Therapeutic right L3-4 LESI x1 (12/04/2022) (100/100/100/100 for the right lower extremity pain that he was experiencing) Diagnostic/therapeutic left L3 & L4 TESI x1 (09/26/2021) (100/100/100/100 x 2 months) Therapeutic left L5 TFESI x2 (04/25/2021) (100/100/100/90-100)  Therapeutic left L5-S1 LESI x2 (08/30/2022) (100/100/70/ LBP:90  LLEP:100)  Therapeutic left L3-4 LESI x2 (12/20/2022) (100/90/65/100-65)  Therapeutic  right L3-4 LESI x1 (04/25/2021) (100/100/100/90-100)    Therapeutic  Palliative (PRN) options:         Recent Visits Date Type Provider Dept  05/07/23 Procedure visit Renaldo Caroli, MD [facility]  04/15/23 Office Visit Renaldo Caroli, MD [facility]  Showing recent visits within past 90 days and meeting all other requirements Today's Visits Date Type Provider Dept  05/21/23 Office Visit Renaldo Caroli, MD [facility]  Showing today's visits and meeting all other requirements Future Appointments No visits were found meeting these conditions. Showing future appointments within next 90 days and meeting all other requirements  I discussed the assessment and treatment plan with the patient. The patient was provided an opportunity to ask questions and all were answered. The patient agreed with the plan and demonstrated an understanding of the instructions.  Patient advised to call back or seek an in-person evaluation if the symptoms or condition worsens.  Duration of encounter: 12 minutes.  Note by: Candi Chafe, MD Date: 05/21/2023; Time: 3:41 PM

## 2023-05-21 ENCOUNTER — Ambulatory Visit: Attending: Pain Medicine | Admitting: Pain Medicine

## 2023-05-21 DIAGNOSIS — M4316 Spondylolisthesis, lumbar region: Secondary | ICD-10-CM | POA: Diagnosis not present

## 2023-05-21 DIAGNOSIS — M79605 Pain in left leg: Secondary | ICD-10-CM | POA: Diagnosis not present

## 2023-05-21 DIAGNOSIS — Z09 Encounter for follow-up examination after completed treatment for conditions other than malignant neoplasm: Secondary | ICD-10-CM

## 2023-05-21 DIAGNOSIS — M5442 Lumbago with sciatica, left side: Secondary | ICD-10-CM

## 2023-05-21 DIAGNOSIS — M4696 Unspecified inflammatory spondylopathy, lumbar region: Secondary | ICD-10-CM

## 2023-05-21 DIAGNOSIS — M5417 Radiculopathy, lumbosacral region: Secondary | ICD-10-CM | POA: Diagnosis not present

## 2023-05-21 DIAGNOSIS — G8929 Other chronic pain: Secondary | ICD-10-CM

## 2023-05-21 DIAGNOSIS — M5136 Other intervertebral disc degeneration, lumbar region with discogenic back pain only: Secondary | ICD-10-CM

## 2023-05-21 DIAGNOSIS — M48062 Spinal stenosis, lumbar region with neurogenic claudication: Secondary | ICD-10-CM

## 2023-05-21 DIAGNOSIS — M48061 Spinal stenosis, lumbar region without neurogenic claudication: Secondary | ICD-10-CM

## 2023-09-22 NOTE — Progress Notes (Unsigned)
 PROVIDER NOTE: Interpretation of information contained herein should be left to medically-trained personnel. Specific patient instructions are provided elsewhere under Patient Instructions section of medical record. This document was created in part using AI and STT-dictation technology, any transcriptional errors that may result from this process are unintentional.  Patient: Todd Bradford  Service: E/M   PCP: Burton Lauraine LABOR, NP  DOB: [birth year]  DOS: 09/23/2023  Provider: Eric LABOR Como, MD  MRN: [id number]  Delivery: Virtual Visit  Specialty: Interventional Pain Management  Type: Established Patient  Setting: Ambulatory outpatient facility  Specialty designation: 09  Referring Prov.: Burton Lauraine LABOR, NP  Location: Remote location       Virtual Encounter - Pain Management PROVIDER NOTE: Information contained herein reflects review and annotations entered in association with encounter. Interpretation of such information and data should be left to medically-trained personnel. Information provided to patient can be located elsewhere in the medical record under Patient Instructions. Document created using STT-dictation technology, any transcriptional errors that may result from process are unintentional.    Contact & Pharmacy Preferred: [phone] Home: [phone] (home) Mobile: [phone] (mobile) E-mail: [email or web address]  [facility] - [place], [place] - [address] [place] [address] Phone: [phone] Fax: [phone]  [facility] - [place], [place] - [address] [place] [place] [address] Phone: [phone] Fax: [phone]   Pre-screening  Todd Bradford offered in-person vs virtual encounter. He indicated preferring virtual for this encounter.   Reason COVID-19*  Social distancing based on CDC and AMA recommendations.   I contacted Todd Bradford on 09/23/2023 via telephone.      I clearly  identified myself as Eric LABOR Como, MD. I verified that I was speaking with the correct person using two identifiers (Name: Todd Bradford, and date of birth: October 16, 1950).  Consent I sought verbal advanced consent from Todd Bradford for virtual visit interactions. I informed Todd Bradford of possible security and privacy concerns, risks, and limitations associated with providing not-in-person medical evaluation and management services. I also informed Todd Bradford of the availability of in-person appointments. Finally, I informed him that there would be a charge for the virtual visit and that he could be  personally, fully or partially, financially responsible for it. Todd Bradford expressed understanding and agreed to proceed.   Historic Elements   Todd Bradford is a 73 y.o. year old, male patient evaluated today after our last contact on 05/21/2023. Todd Bradford  has a past medical history of Hyperlipidemia. He also  has a past surgical history that includes Neck surgery (2000 approx); Spine surgery; and Prostate surgery (04/2022). Todd Bradford has a current medication list which includes the following prescription(s): vitamin c, atorvastatin, cyclobenzaprine, finasteride, ibuprofen, rivaroxaban , tramadol, and triamcinolone  ointment. He  reports that he quit smoking about 18 years ago. His smoking use included cigarettes. He started smoking about 48 years ago. He has a 60 pack-year smoking history. He has never used smokeless tobacco. No history on file for alcohol use and drug use. Todd Bradford has no known allergies.  BMI: Estimated body mass index is 25.93 kg/m as calculated from the following:   Height as of 05/07/23: 6' 4 (1.93 m).   Weight as of 05/07/23: 213 lb (96.6 kg). Last encounter: 05/21/2023. Last procedure: 05/07/2023.  HPI  Today, he is being contacted for worsening of previously known (established) problem.  The patient indicates having a recurrence of his low back pain  and lower extremity pain  with the right leg being worse than the left.  He indicates experiencing weakness on that right lower extremity and the pain in the lower back seems to be midline with an area of pain close to the tailbone.  He refers experiencing difficulty standing up once he has been kneeling down.  He would like to have the epidural steroid injection repeated.  He is still taking the Xarelto  and he will need to stop that for 3 days prior to the procedure.  Pharmacotherapy Assessment  Opioid Analgesic: no chronic opioid analgesics therapy prescribed by our practice. Tramadol 50 mg, half a tablet p.o. daily MME/day: 2.5 mg/day   Monitoring: [place] PMP: PDMP not reviewed this encounter.       Pharmacotherapy: No side-effects or adverse reactions reported. Compliance: No problems identified. Effectiveness: Clinically acceptable. Plan: Refer to POC.  UDS:  Summary  Date Value Ref Range Status  02/16/2020 FINAL  Final    Comment:    ==================================================================== [facility] COMP DRUG ANALYSIS,UR ==================================================================== Test                             Result       Flag       Units Drug Present and Declared for Prescription Verification   Tramadol                       5215         EXPECTED   ng/mg creat   O-Desmethyltramadol            6226         EXPECTED   ng/mg creat   N-Desmethyltramadol            1269         EXPECTED   ng/mg creat    Source of tramadol is a prescription medication.    O-desmethyltramadol and N-desmethyltramadol are expected    metabolites of tramadol.    Cyclobenzaprine                PRESENT      EXPECTED   Desmethylcyclobenzaprine       PRESENT      EXPECTED    Desmethylcyclobenzaprine is an expected metabolite of    cyclobenzaprine.  Drug Present not Declared for Prescription Verification   Ephedrine/Pseudoephedrine      PRESENT      UNEXPECTED   Phenylpropanolamine            PRESENT       UNEXPECTED    Source of ephedrine/pseudoephedrine is most commonly    pseudoephedrine in over-the-counter or prescription cold and    allergy medications. Phenylpropanolamine is an expected    metabolite of ephedrine/pseudoephedrine.    Orphenadrine                    PRESENT      UNEXPECTED   Ibuprofen                      PRESENT      UNEXPECTED   Diphenhydramine                PRESENT      UNEXPECTED ==================================================================== Test                      Result    Flag   Units  Ref Range   Creatinine              74               mg/dL      >=79 ==================================================================== Declared Medications:  The flagging and interpretation on this report are based on the  following declared medications.  Unexpected results may arise from  inaccuracies in the declared medications.   **Note: The testing scope of this panel includes these medications:   Cyclobenzaprine (Flexeril)  Tramadol (Ultram)   **Note: The testing scope of this panel does not include following  reported medications:   Atorvastatin (Lipitor)  Finasteride (Proscar)  Tamsulosin (Flomax) ==================================================================== For clinical consultation, please call [phone]. ====================================================================    No results found for: CBDTHCR, D8THCCBX, D9THCCBX  Laboratory Chemistry Profile   Renal Lab Results  Component Value Date   BUN 26 (H) 03/01/2020   CREATININE 1.24 03/01/2020   BCR 18 02/15/2020   GFRAA 64 02/15/2020   GFRNONAA >60 03/01/2020    Hepatic Lab Results  Component Value Date   AST 45 (H) 03/01/2020   ALT 77 (H) 03/01/2020   ALBUMIN 3.6 03/01/2020   ALKPHOS 161 (H) 03/01/2020    Electrolytes Lab Results  Component Value Date   NA 136 03/01/2020   K 4.3 03/01/2020   CL 100 03/01/2020   CALCIUM 8.9 03/01/2020   MG 2.3  02/15/2020    Bone Lab Results  Component Value Date   25OHVITD1 37 02/15/2020   25OHVITD2 <1.0 02/15/2020   25OHVITD3 37 02/15/2020    Inflammation (CRP: Acute Phase) (ESR: Chronic Phase) Lab Results  Component Value Date   CRP 9 02/15/2020   ESRSEDRATE 19 02/15/2020         Note: Above Lab results reviewed.  Imaging  DG PAIN CLINIC C-ARM 1-60 MIN NO REPORT Fluoro was used, but no Radiologist interpretation will be provided.  Please refer to NOTES tab for provider progress note.  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 Diagnoses of Chronic low back pain (Left) w/ sciatica (Bilateral), Chronic low back pain (Left) w/o sciatica, Chronic lower extremity pain (2ry area of Pain) (Left), and Chronic anticoagulation (Xarelto ) were also pertinent to this visit.  Plan of Care  Problem-specific:  No problem-specific Assessment & Plan notes found for this encounter.  Todd Bradford is not on any long-term medications.  Pharmacotherapy (Medications Ordered): No orders of the defined types were placed in this encounter.  Orders:  Orders Placed This Encounter  Procedures   Lumbar Epidural Injection    Standing Status:   Future    Expiration Date:   12/22/2023    Scheduling Instructions:     Procedure: Interlaminar Lumbar Epidural Steroid injection (LESI)  L3-4     Laterality: Midline     Sedation: Patient's choice.     Timeframe: ASAP    Where will this procedure be performed?:   [facility]   Blood Thinner Instructions to Nursing    Always make sure patient has clearance from prescribing physician to stop blood thinners for interventional therapies. If the patient requires a Lovenox-bridge therapy, make sure arrangements are made to institute it with the assistance of the PCP.    Scheduling Instructions:     Have Todd Bradford stop the Xarelto  (Rivaroxaban ) x 3 days prior to procedure or  surgery.   Follow-up plan:   Return for [facility]): L3-4 LESI #  3, (Blood Thinner Protocol).      Interventional Therapies  Risk  Complexity Considerations:   Xarelto  ANTICOAGULATION: (Stop: 3 days  Restart: 6 hours)   Planned  Pending:   Therapeutic midline L3-4 LESI #3    Under consideration:   Diagnostic bilateral (L2-S1) lumbar facet MBB #1  Therapeutic midline L2-3 LESI #1  Therapeutic L2-3, L3-4, and L4-5 MILD procedure   Completed:   Therapeutic midline L3-4 LESI x2 (05/07/2023) (100/100/100/100)  Therapeutic right L3-4 LESI x1 (12/04/2022) (100/100/100/100 for the right lower extremity pain that he was experiencing) Diagnostic/therapeutic left L3 & L4 TESI x1 (09/26/2021) (100/100/100/100 x 2 months) Therapeutic left L5 TFESI x2 (04/25/2021) (100/100/100/90-100)  Therapeutic left L5-S1 LESI x2 (08/30/2022) (100/100/70/ LBP:90  LLEP:100)  Therapeutic left L3-4 LESI x2 (12/20/2022) (100/90/65/100-65)  Therapeutic right L3-4 LESI x1 (04/25/2021) (100/100/100/90-100)    Therapeutic  Palliative (PRN) options:         Recent Visits No visits were found meeting these conditions. Showing recent visits within past 90 days and meeting all other requirements Today's Visits Date Type Provider Dept  09/23/23 Office Visit Tanya Glisson, MD [facility]  Showing today's visits and meeting all other requirements Future Appointments No visits were found meeting these conditions. Showing future appointments within next 90 days and meeting all other requirements  I discussed the assessment and treatment plan with the patient. The patient was provided an opportunity to ask questions and all were answered. The patient agreed with the plan and demonstrated an understanding of the instructions.  Patient advised to call back or seek an in-person evaluation if the symptoms or condition worsens.  Duration of encounter: 10 minutes.  Note by: Glisson DELENA Tanya, MD Date:  09/23/2023; Time: 2:55 PM

## 2023-09-22 NOTE — Patient Instructions (Signed)
 ______________________________________________________________________    Procedure instructions  Stop blood-thinners  Do not eat or drink fluids (other than water) for 6 hours before your procedure  No water for 2 hours before your procedure  Take your blood pressure medicine with a sip of water  Arrive 30 minutes before your appointment  If sedation is planned, bring suitable driver. [place], [place], & public transportation are NOT APPROVED)  Carefully read the Preparing for your procedure detailed instructions  If you have questions call us  at [phone]  Procedure appointments are for procedures only.   NO medication refills or new problem evaluations will be done on procedure days.   Only the scheduled, pre-approved procedure and side will be done.   ______________________________________________________________________     ______________________________________________________________________    Preparing for your procedure  Appointments: If you think you may not be able to keep your appointment, call 24-48 hours in advance to cancel. We need time to make it available to others.  Procedure visits are for procedures only. During your procedure appointment there will be: NO Prescription Refills*. NO medication changes or discussions*. NO discussion of disability issues*. NO unrelated pain problem evaluations*. NO evaluations to order other pain procedures*. *These will be addressed at a separate and distinct evaluation encounter on the provider's evaluation schedule and not during procedure days.  Instructions: Food intake: Avoid eating anything solid for at least 8 hours prior to your procedure. Clear liquid intake: You may take clear liquids such as water up to 2 hours prior to your procedure. (No carbonated drinks. No soda.) Transportation: Unless otherwise stated by your physician, bring a driver. (Driver cannot be a Market researcher, Pharmacist, community, or any other form of public  transportation.) Morning Medicines: Except for blood thinners, take all of your other morning medications with a sip of water. Make sure to take your heart and blood pressure medicines. If your blood pressure's lower number is above 100, the case will be rescheduled. Blood thinners: Make sure to stop your blood thinners as instructed.  If you take a blood thinner, but were not instructed to stop it, call our office [phone] and ask to talk to a nurse. Not stopping a blood thinner prior to certain procedures could lead to serious complications. Diabetics on insulin: Notify the staff so that you can be scheduled 1st case in the morning. If your diabetes requires high dose insulin, take only  of your normal insulin dose the morning of the procedure and notify the staff that you have done so. Preventing infections: Shower with an antibacterial soap the morning of your procedure.  Build-up your immune system: Take 1000 mg of Vitamin C with every meal (3 times a day) the day prior to your procedure. Antibiotics: Inform the nursing staff if you are taking any antibiotics or if you have any conditions that may require antibiotics prior to procedures. (Example: recent joint implants)   Pregnancy: If you are pregnant make sure to notify the nursing staff. Not doing so may result in injury to the fetus, including death.  Sickness: If you have a cold, fever, or any active infections, call and cancel or reschedule your procedure. Receiving steroids while having an infection may result in complications. Arrival: You must be in the facility at least 30 minutes prior to your scheduled procedure. Tardiness: Your scheduled time is also the cutoff time. If you do not arrive at least 15 minutes prior to your procedure, you will be rescheduled.  Children: Do not bring any children with  you. Make arrangements to keep them home. Dress appropriately: There is always a possibility that your clothing may get soiled. Avoid  long dresses. Valuables: Do not bring any jewelry or valuables.  Reasons to call and reschedule or cancel your procedure: (Following these recommendations will minimize the risk of a serious complication.) Surgeries: Avoid having procedures within 2 weeks of any surgery. (Avoid for 2 weeks before or after any surgery). Flu Shots: Avoid having procedures within 2 weeks of a flu shots or . (Avoid for 2 weeks before or after immunizations). Barium: Avoid having a procedure within 7-10 days after having had a radiological study involving the use of radiological contrast. (Myelograms, Barium swallow or enema study). Heart attacks: Avoid any elective procedures or surgeries for the initial 6 months after a Myocardial Infarction (Heart Attack). Blood thinners: It is imperative that you stop these medications before procedures. Let us  know if you if you take any blood thinner.  Infection: Avoid procedures during or within two weeks of an infection (including chest colds or gastrointestinal problems). Symptoms associated with infections include: Localized redness, fever, chills, night sweats or profuse sweating, burning sensation when voiding, cough, congestion, stuffiness, runny nose, sore throat, diarrhea, nausea, vomiting, cold or Flu symptoms, recent or current infections. It is specially important if the infection is over the area that we intend to treat. Heart and lung problems: Symptoms that may suggest an active cardiopulmonary problem include: cough, chest pain, breathing difficulties or shortness of breath, dizziness, ankle swelling, uncontrolled high or unusually low blood pressure, and/or palpitations. If you are experiencing any of these symptoms, cancel your procedure and contact your primary care physician for an evaluation.  Remember:  Regular Business hours are:  Monday to Thursday 8:00 AM to 4:00 PM  Provider's Schedule: Eric Como, MD:  Procedure days: Tuesday and Thursday 7:30  AM to 4:00 PM  Wallie Sherry, MD:  Procedure days: Monday and Wednesday 7:30 AM to 4:00 PM Last  Updated: 12/11/2022 ______________________________________________________________________     ______________________________________________________________________    General Risks and Possible Complications  Patient Responsibilities: It is important that you read this as it is part of your informed consent. It is our duty to inform you of the risks and possible complications associated with treatments offered to you. It is your responsibility as a patient to read this and to ask questions about anything that is not clear or that you believe was not covered in this document.  Patient's Rights: You have the right to refuse treatment. You also have the right to change your mind, even after initially having agreed to have the treatment done. However, under this last option, if you wait until the last second to change your mind, you may be charged for the materials used up to that point.  Introduction: Medicine is not an Visual merchandiser. Everything in Medicine, including the lack of treatment(s), carries the potential for danger, harm, or loss (which is by definition: Risk). In Medicine, a complication is a secondary problem, condition, or disease that can aggravate an already existing one. All treatments carry the risk of possible complications. The fact that a side effects or complications occurs, does not imply that the treatment was conducted incorrectly. It must be clearly understood that these can happen even when everything is done following the highest safety standards.  No treatment: You can choose not to proceed with the proposed treatment alternative. The "PRO(s)" would include: avoiding the risk of complications associated with the therapy. The "CON(s)" would include:  not getting any of the treatment benefits. These benefits fall under one of three categories: diagnostic; therapeutic; and/or  palliative. Diagnostic benefits include: getting information which can ultimately lead to improvement of the disease or symptom(s). Therapeutic benefits are those associated with the successful treatment of the disease. Finally, palliative benefits are those related to the decrease of the primary symptoms, without necessarily curing the condition (example: decreasing the pain from a flare-up of a chronic condition, such as incurable terminal cancer).  General Risks and Complications: These are associated to most interventional treatments. They can occur alone, or in combination. They fall under one of the following six (6) categories: no benefit or worsening of symptoms; bleeding; infection; nerve damage; allergic reactions; and/or death. No benefits or worsening of symptoms: In Medicine there are no guarantees, only probabilities. No healthcare provider can ever guarantee that a medical treatment will work, they can only state the probability that it may. Furthermore, there is always the possibility that the condition may worsen, either directly, or indirectly, as a consequence of the treatment. Bleeding: This is more common if the patient is taking a blood thinner, either prescription or over the counter (example: Goody Powders, Fish oil, Aspirin, Garlic, etc.), or if suffering a condition associated with impaired coagulation (example: Hemophilia, cirrhosis of the liver, low platelet counts, etc.). However, even if you do not have one on these, it can still happen. If you have any of these conditions, or take one of these drugs, make sure to notify your treating physician. Infection: This is more common in patients with a compromised immune system, either due to disease (example: diabetes, cancer, human immunodeficiency virus [HIV], etc.), or due to medications or treatments (example: therapies used to treat cancer and rheumatological diseases). However, even if you do not have one on these, it can still  happen. If you have any of these conditions, or take one of these drugs, make sure to notify your treating physician. Nerve Damage: This is more common when the treatment is an invasive one, but it can also happen with the use of medications, such as those used in the treatment of cancer. The damage can occur to small secondary nerves, or to large primary ones, such as those in the spinal cord and brain. This damage may be temporary or permanent and it may lead to impairments that can range from temporary numbness to permanent paralysis and/or brain death. Allergic Reactions: Any time a substance or material comes in contact with our body, there is the possibility of an allergic reaction. These can range from a mild skin rash (contact dermatitis) to a severe systemic reaction (anaphylactic reaction), which can result in death. Death: In general, any medical intervention can result in death, most of the time due to an unforeseen complication. ______________________________________________________________________      ______________________________________________________________________    Blood Thinners  IMPORTANT NOTICE:  If you take any of these, make sure to notify the nursing staff.  Failure to do so may result in serious injury.  Recommended time intervals to stop and restart blood-thinners, before & after invasive procedures  Generic Name Brand Name Pre-procedure: Stop medication for this amount of time before your procedure: Post-procedure: Wait this amount of time after the procedure before restarting your medication:  Abciximab Reopro 15 days 2 hrs  Alteplase Activase 10 days 10 days  Anagrelide Agrylin    Apixaban Eliquis 3 days 6 hrs  Cilostazol Pletal 3 days 5 hrs  Clopidogrel Plavix 7-10 days 2 hrs  Dabigatran Pradaxa 5 days 6 hrs  Dalteparin Fragmin 24 hours 4 hrs  Dipyridamole Aggrenox 11days 2 hrs  Edoxaban Lixiana; Savaysa 3 days 2 hrs  Enoxaparin  Lovenox 24 hours 4 hrs   Eptifibatide Integrillin 8 hours 2 hrs  Fondaparinux  Arixtra 72 hours 12 hrs  Hydroxychloroquine Plaquenil 11 days   Prasugrel Effient 7-10 days 6 hrs  Reteplase Retavase 10 days 10 days  Rivaroxaban  Xarelto  3 days 6 hrs  Ticagrelor Brilinta 5-7 days 6 hrs  Ticlopidine Ticlid 10-14 days 2 hrs  Tinzaparin Innohep 24 hours 4 hrs  Tirofiban Aggrastat 8 hours 2 hrs  Warfarin Coumadin 5 days 2 hrs   Other medications with blood-thinning effects  NOTE: Consider stopping these if you have prolonged bleeding despite not taking any of the above blood thinners. Otherwise ask your provider and this will be decided on a case-by-case basis.  Product indications Generic (Brand) names Note  Cholesterol Lipitor Stop 4 days before procedure  Blood thinner (injectable) Heparin (LMW or LMWH Heparin) Stop 24 hours before procedure  Cancer Ibrutinib (Imbruvica) Stop 7 days before procedure  Malaria/Rheumatoid Hydroxychloroquine (Plaquenil) Stop 11 days before procedure  Thrombolytics  10 days before or after procedures   Over-the-counter (OTC) Products with blood-thinning effects  NOTE: Consider stopping these if you have prolonged bleeding despite not taking any of the above blood thinners. Otherwise ask your provider and this will be decided on a case-by-case basis.  Product Common names Stop Time  Aspirin > 325 mg Goody Powders, Excedrin, etc. 11 days  Aspirin <= 81 mg  7 days  Fish oil  4 days  Garlic supplements  7 days  Ginkgo biloba  36 hours  Ginseng  24 hours  NSAIDs Ibuprofen, Naprosyn, etc. 3 days  Vitamin E  4 days   ______________________________________________________________________     ______________________________________________________________________    Steroid injections  Common steroids for injections Triamcinolone : Used by many sports medicine physicians for large joint and bursal injections, often combined with a local anesthetic like lidocaine . A study focusing on  coccydynia (tailbone pain) found triamcinolone  was more effective than betamethasone, suggesting it may also be preferable for other localized inflammation conditions. Methylprednisolone: A common alternative to triamcinolone  that is also a strong anti-inflammatory. It is available in different formulations, with the acetate suspension being the long-acting option for intra-articular injections. Dexamethasone : This is a non-particulate steroid, meaning it has a lower risk of tissue damage compared to particulate steroids like triamcinolone  and methylprednisolone. While less common for this specific use, it is an option for targeted injections.   Considerations for physicians Particulate vs. non-particulate steroids: Triamcinolone  and methylprednisolone are particulate, meaning they can clump together. Dexamethasone  is non-particulate. Particulate steroids are often preferred for their longer-lasting effects but carry a theoretical higher risk for certain injections (though this is less of a concern in the costochondral joints). Combined injectate: Corticosteroids are typically mixed with a local anesthetic like lidocaine  to provide both immediate pain relief (from the anesthetic) and longer-term inflammation reduction (from the steroid). Imaging guidance: To ensure accurate placement of the needle and medication, physicians may use ultrasound or fluoroscopic guidance for the injection, especially in complex or refractory cases.   Patient guidance Before undergoing a steroid injection, discuss the options with your physician. They will determine the best steroid, dosage, and procedure for your specific case based on factors like: Severity of your condition History of response to other treatments Your overall health status Experience and preference of the physician  Last  Updated: 08/27/2023 ______________________________________________________________________

## 2023-09-23 ENCOUNTER — Ambulatory Visit: Attending: Pain Medicine | Admitting: Pain Medicine

## 2023-09-23 DIAGNOSIS — M5442 Lumbago with sciatica, left side: Secondary | ICD-10-CM

## 2023-09-23 DIAGNOSIS — G8929 Other chronic pain: Secondary | ICD-10-CM

## 2023-09-23 DIAGNOSIS — M79605 Pain in left leg: Secondary | ICD-10-CM

## 2023-09-23 DIAGNOSIS — M5441 Lumbago with sciatica, right side: Secondary | ICD-10-CM | POA: Diagnosis not present

## 2023-09-23 DIAGNOSIS — Z7901 Long term (current) use of anticoagulants: Secondary | ICD-10-CM | POA: Diagnosis not present

## 2023-10-15 ENCOUNTER — Ambulatory Visit (HOSPITAL_BASED_OUTPATIENT_CLINIC_OR_DEPARTMENT_OTHER): Admitting: Pain Medicine

## 2023-10-15 ENCOUNTER — Ambulatory Visit
Admission: RE | Admit: 2023-10-15 | Discharge: 2023-10-15 | Disposition: A | Discharge status: Home / Self Care | Source: Ambulatory Visit | Attending: Pain Medicine | Admitting: Pain Medicine

## 2023-10-15 ENCOUNTER — Encounter: Payer: Self-pay | Admitting: Pain Medicine

## 2023-10-15 VITALS — BP 145/93 | HR 78 | Temp 97.4°F | Resp 16 | Ht 76.0 in | Wt 204.0 lb

## 2023-10-15 DIAGNOSIS — M51369 Other intervertebral disc degeneration, lumbar region without mention of lumbar back pain or lower extremity pain: Secondary | ICD-10-CM | POA: Insufficient documentation

## 2023-10-15 DIAGNOSIS — M4696 Unspecified inflammatory spondylopathy, lumbar region: Secondary | ICD-10-CM | POA: Insufficient documentation

## 2023-10-15 DIAGNOSIS — M5441 Lumbago with sciatica, right side: Secondary | ICD-10-CM | POA: Insufficient documentation

## 2023-10-15 DIAGNOSIS — M5417 Radiculopathy, lumbosacral region: Secondary | ICD-10-CM

## 2023-10-15 DIAGNOSIS — M48062 Spinal stenosis, lumbar region with neurogenic claudication: Secondary | ICD-10-CM | POA: Diagnosis present

## 2023-10-15 DIAGNOSIS — M5442 Lumbago with sciatica, left side: Secondary | ICD-10-CM

## 2023-10-15 DIAGNOSIS — G8929 Other chronic pain: Secondary | ICD-10-CM | POA: Insufficient documentation

## 2023-10-15 DIAGNOSIS — M79605 Pain in left leg: Secondary | ICD-10-CM | POA: Diagnosis present

## 2023-10-15 DIAGNOSIS — M4316 Spondylolisthesis, lumbar region: Secondary | ICD-10-CM | POA: Diagnosis present

## 2023-10-15 DIAGNOSIS — Z7901 Long term (current) use of anticoagulants: Secondary | ICD-10-CM | POA: Insufficient documentation

## 2023-10-15 DIAGNOSIS — M5136 Other intervertebral disc degeneration, lumbar region with discogenic back pain only: Secondary | ICD-10-CM

## 2023-10-15 DIAGNOSIS — M545 Low back pain, unspecified: Secondary | ICD-10-CM | POA: Insufficient documentation

## 2023-10-15 DIAGNOSIS — M48061 Spinal stenosis, lumbar region without neurogenic claudication: Secondary | ICD-10-CM | POA: Insufficient documentation

## 2023-10-15 MED ORDER — LIDOCAINE HCL 2 % IJ SOLN
20.0000 mL | Freq: Once | INTRAMUSCULAR | Status: AC
  Administered 2023-10-15: 400 mg
  Filled 2023-10-15: qty 20

## 2023-10-15 MED ORDER — TRIAMCINOLONE ACETONIDE 40 MG/ML IJ SUSP
40.0000 mg | Freq: Once | INTRAMUSCULAR | Status: AC
  Administered 2023-10-15: 40 mg
  Filled 2023-10-15: qty 1

## 2023-10-15 MED ORDER — PENTAFLUOROPROP-TETRAFLUOROETH EX AERO
INHALATION_SPRAY | Freq: Once | CUTANEOUS | Status: AC
  Administered 2023-10-15: 30 via TOPICAL

## 2023-10-15 MED ORDER — SODIUM CHLORIDE 0.9% FLUSH
2.0000 mL | Freq: Once | INTRAVENOUS | Status: AC
  Administered 2023-10-15: 10 mL

## 2023-10-15 MED ORDER — ROPIVACAINE HCL 2 MG/ML IJ SOLN
2.0000 mL | Freq: Once | INTRAMUSCULAR | Status: AC
  Administered 2023-10-15: 20 mL via EPIDURAL
  Filled 2023-10-15: qty 20

## 2023-10-15 MED ORDER — IOHEXOL 180 MG/ML  SOLN
10.0000 mL | Freq: Once | INTRAMUSCULAR | Status: AC
  Administered 2023-10-15: 10 mL via EPIDURAL
  Filled 2023-10-15: qty 20

## 2023-10-15 NOTE — Progress Notes (Signed)
 PROVIDER NOTE: Interpretation of information contained herein should be left to medically-trained personnel. Specific patient instructions are provided elsewhere under Patient Instructions section of medical record. This document was created in part using STT-dictation technology, any transcriptional errors that may result from this process are unintentional.  Patient: Todd Bradford Type: Established DOB: Feb 15, 1950 MRN: [id number] PCP: Todd Lauraine LABOR, NP  Service: Procedure DOS: 10/15/2023 Setting: Ambulatory Location: Ambulatory outpatient facility Delivery: Face-to-face Provider: Eric Bradford Como, MD Specialty: Interventional Pain Management Specialty designation: 09 Location: Outpatient facility Ref. Prov.: Como Eric, MD       Interventional Therapy   Type: Lumbar epidural steroid injection (LESI) (interlaminar) #3    Laterality: Midline   Level:  L3-4 Level.  Imaging: Fluoroscopic guidance Spinal ([id number]) Anesthesia: Local anesthesia (1-2% Lidocaine ) Anxiolysis: None                 Sedation: No Sedation                       DOS: 10/15/2023  Performed by: Eric Bradford Como, MD  Purpose: Diagnostic/Therapeutic Indications: Lumbar radicular pain of intraspinal etiology of more than 4 weeks that has failed to respond to conservative therapy and is severe enough to impact quality of life or function. 1. 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 Chronic lower extremity pain (2ry area of Pain) (Left)   3. Degeneration of intervertebral disc of lumbar region, unspecified whether pain present   4. 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 Inflammatory spondylopathy of lumbar region [facility])  (Right: L3-4)   6. 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 Lumbar lateral recess stenosis  (Right: L3-4)   9. Lumbosacral radiculopathy at L5 (Bilateral)   10.  Lumbosacral radiculopathy at S1 (Bilateral)   11. Chronic anticoagulation (Xarelto )    NAS-11 Pain score:   Pre-procedure: 4 /10   Post-procedure: 0-No pain/10      Position / Prep / Materials:  Position: Prone w/ head of the table raised (slight reverse trendelenburg) to facilitate breathing.  Prep solution: ChloraPrep (2% chlorhexidine gluconate and 70% isopropyl alcohol) Prep Area: Entire Posterior Lumbar Region from lower scapular tip down to mid buttocks area and from flank to flank. Materials:  Tray: Epidural tray Needle(s):  Type: Epidural needle (Tuohy) Gauge (G):  17 Length: Regular (3.5-in) Qty: 1  H&P (Pre-op Assessment):  Todd Bradford is a 73 y.o. (year old), male patient, seen today for interventional treatment. He  has a past surgical history that includes Neck surgery (2000 approx); Spine surgery; and Prostate surgery (04/2022). Todd Bradford has a current medication list which includes the following prescription(s): vitamin c, atorvastatin, cyclobenzaprine, finasteride, ibuprofen, rivaroxaban , tramadol, and triamcinolone  ointment. His primarily concern today is the Back Pain (Lower back worse in the middle and right hip)  Initial Vital Signs:  Pulse/HCG Rate: 78ECG Heart Rate: 84 Temp: (!) 97.4 F (36.3 C) Resp: 14 BP: (!) 142/93 SpO2: 95 %  BMI: Estimated body mass index is 24.83 kg/m as calculated from the following:   Height as of this encounter: 6' 4 (1.93 m).   Weight as of this encounter: 204 lb (92.5 kg).  Risk Assessment: Allergies: Reviewed. He has no known allergies.  Allergy Precautions: None required Coagulopathies: Reviewed. None identified.  Blood-thinner therapy: None at this time Active Infection(s): Reviewed. None identified. Todd Bradford is afebrile  Site Confirmation: Todd Bradford was asked to confirm the procedure and laterality before marking the site Procedure checklist: Completed Consent: Before the procedure and under the influence of no  sedative(s), amnesic(s), or anxiolytics, the patient was informed of the treatment options, risks and possible complications. To fulfill our ethical and legal obligations, as recommended by the American Medical Association's Code of Ethics, I have informed the patient of my clinical impression; the nature and purpose of the treatment or procedure; the risks, benefits, and possible complications of the intervention; the alternatives, including doing nothing; the risk(s) and benefit(s) of the alternative treatment(s) or procedure(s); and the risk(s) and benefit(s) of doing nothing. The patient was provided information about the general risks and possible complications associated with the procedure. These may include, but are not limited to: failure to achieve desired goals, infection, bleeding, organ or nerve damage, allergic reactions, paralysis, and death. In addition, the patient was informed of those risks and complications associated to Spine-related procedures, such as failure to decrease pain; infection (i.e.: Meningitis, epidural or intraspinal abscess); bleeding (i.e.: epidural hematoma, subarachnoid hemorrhage, or any other type of intraspinal or peri-dural bleeding); organ or nerve damage (i.e.: Any type of peripheral nerve, nerve root, or spinal cord injury) with subsequent damage to sensory, motor, and/or autonomic systems, resulting in permanent pain, numbness, and/or weakness of one or several areas of the body; allergic reactions; (i.e.: anaphylactic reaction); and/or death. Furthermore, the patient was informed of those risks and complications associated with the medications. These include, but are not limited to: allergic reactions (i.e.: anaphylactic or anaphylactoid reaction(s)); adrenal axis suppression; blood sugar elevation that in diabetics may result in ketoacidosis or comma; water retention that in patients with history of congestive heart failure may result in shortness of breath,  pulmonary edema, and decompensation with resultant heart failure; weight gain; swelling or edema; medication-induced neural toxicity; particulate matter embolism and blood vessel occlusion with resultant organ, and/or nervous system infarction; and/or aseptic necrosis of one or more joints. Finally, the patient was informed that Medicine is not an exact science; therefore, there is also the possibility of unforeseen or unpredictable risks and/or possible complications that may result in a catastrophic outcome. The patient indicated having understood very clearly. We have given the patient no guarantees and we have made no promises. Enough time was given to the patient to ask questions, all of which were answered to the patient's satisfaction. Todd Bradford has indicated that he wanted to continue with the procedure. Attestation: I, the ordering provider, attest that I have discussed with the patient the benefits, risks, side-effects, alternatives, likelihood of achieving goals, and potential problems during recovery for the procedure that I have provided informed consent. Date  Time: 10/15/2023  1:04 PM  Pre-Procedure Preparation:  Monitoring: As per clinic protocol. Respiration, ETCO2, SpO2, BP, heart rate and rhythm monitor placed and checked for adequate function Safety Precautions: Patient was assessed for positional comfort and pressure points before starting the procedure. Time-out: I initiated and conducted the Time-out before starting the procedure, as per protocol. The patient was asked to participate by confirming the accuracy of the Time Out information. Verification of the correct person, site, and procedure were performed and confirmed by me, the nursing staff, and the patient. Time-out conducted as per Joint Commission's Universal Protocol (UP.01.01.01). Time: 1333 Start Time: 1333 hrs.  Description/Narrative of Procedure:          Target: Epidural space via interlaminar opening,  initially targeting the lower laminar border of the  superior vertebral body. Region: Lumbar Approach: Percutaneous paravertebral  Rationale (medical necessity): procedure needed and proper for the diagnosis and/or treatment of the patient's medical symptoms and needs. Procedural Technique Safety Precautions: Aspiration looking for blood return was conducted prior to all injections. At no point did we inject any substances, as a needle was being advanced. No attempts were made at seeking any paresthesias. Safe injection practices and needle disposal techniques used. Medications properly checked for expiration dates. SDV (single dose vial) medications used. Description of the Procedure: Protocol guidelines were followed. The procedure needle was introduced through the skin, ipsilateral to the reported pain, and advanced to the target area. Bone was contacted and the needle walked caudad, until the lamina was cleared. The epidural space was identified using "loss-of-resistance technique" with 2-3 ml of PF-NaCl (0.9% NSS), in a 5cc LOR glass syringe.  Vitals:   10/15/23 1303 10/15/23 1335 10/15/23 1340  BP: (!) 142/93 (!) 164/91 (!) 145/93  Pulse: 78    Resp:  14 16  Temp: (!) 97.4 F (36.3 C)    SpO2: 95% 100% 100%  Weight: 204 lb (92.5 kg)    Height: 6' 4 (1.93 m)      Start Time: 1333 hrs. End Time: 1339 hrs.  Imaging Guidance (Spinal):          Type of Imaging Technique: Fluoroscopy Guidance (Spinal) Indication(s): Fluoroscopy guidance for needle placement to enhance accuracy in procedures requiring precise needle localization for targeted delivery of medication in or near specific anatomical locations not easily accessible without such real-time imaging assistance. Exposure Time: Please see nurses notes. Contrast: Before injecting any contrast, we confirmed that the patient did not have an allergy to iodine, shellfish, or radiological contrast. Once satisfactory needle placement was  completed at the desired level, radiological contrast was injected. Contrast injected under live fluoroscopy. No contrast complications. See chart for type and volume of contrast used. Fluoroscopic Guidance: I was personally present during the use of fluoroscopy. Tunnel Vision Technique used to obtain the best possible view of the target area. Parallax error corrected before commencing the procedure. Direction-depth-direction technique used to introduce the needle under continuous pulsed fluoroscopy. Once target was reached, antero-posterior, oblique, and lateral fluoroscopic projection used confirm needle placement in all planes. Images permanently stored in EMR. Interpretation: I personally interpreted the imaging intraoperatively. Adequate needle placement confirmed in multiple planes. Appropriate spread of contrast into desired area was observed. No evidence of afferent or efferent intravascular uptake. No intrathecal or subarachnoid spread observed. Permanent images saved into the patient's record.  Antibiotic Prophylaxis:   Anti-infectives (From admission, onward)    None      Indication(s): None identified  Post-operative Assessment:  Post-procedure Vital Signs:  Pulse/HCG Rate: 7878 Temp: (!) 97.4 F (36.3 C) Resp: 16 BP: (!) 145/93 SpO2: 100 %  EBL: None  Complications: No immediate post-treatment complications observed by team, or reported by patient.  Note: The patient tolerated the entire procedure well. A repeat set of vitals were taken after the procedure and the patient was kept under observation following institutional policy, for this type of procedure. Post-procedural neurological assessment was performed, showing return to baseline, prior to discharge. The patient was provided with post-procedure discharge instructions, including a section on how to identify potential problems. Should any problems arise concerning this procedure, the patient was given instructions to  immediately contact us , at any time, without hesitation. In any case, we plan to contact the patient by telephone for a follow-up status report  regarding this interventional procedure.  Comments:  No additional relevant information.  Plan of Care (POC)  Orders:  Orders Placed This Encounter  Procedures   Lumbar Epidural Injection    Scheduling Instructions:     Procedure: Interlaminar LESI L3-4     Laterality: Midline     Sedation: Patient's choice     Date: 10/15/2023    Where will this procedure be performed?:   [facility]   [facility] C-ARM 1-60 MIN NO REPORT    Intraoperative interpretation by procedural physician at [facility].    Standing Status:   Standing    Number of Occurrences:   1    Reason for exam::   Assistance in needle guidance and placement for procedures requiring needle placement in or near specific anatomical locations not easily accessible without such assistance.   Informed Consent Details: Physician/Practitioner Attestation; Transcribe to consent form and obtain patient signature    Note: Always confirm laterality of pain with Todd Bradford, before procedure. Transcribe to consent form and obtain patient signature.    Physician/Practitioner attestation of informed consent for procedure/surgical case:   I, the physician/practitioner, attest that I have discussed with the patient the benefits, risks, side effects, alternatives, likelihood of achieving goals and potential problems during recovery for the procedure that I have provided informed consent.    Procedure:   Lumbar epidural steroid injection under fluoroscopic guidance    Physician/Practitioner performing the procedure:   Veneda Kirksey A. Tanya, MD    Indication/Reason:   Low back and/or lower extremity pain secondary to lumbar radiculitis   Care order/instruction: Please confirm that the patient has stopped the Xarelto  (Rivaroxaban ) x 3 days prior to procedure or surgery.    Please  confirm that the patient has stopped the Xarelto  (Rivaroxaban ) x 3 days prior to procedure or surgery.    Standing Status:   Standing    Number of Occurrences:   1   Provide equipment / supplies at bedside    Procedural tray: Epidural Tray (Disposable  single use) Skin infiltration needle: Regular 1.5-in, 25-G, (x1) Block needle size: Regular standard Catheter: No catheter required    Standing Status:   Standing    Number of Occurrences:   1    Specify:   Epidural Tray   Bleeding precautions    Standing Status:   Standing    Number of Occurrences:   1     Opioid Analgesic: no chronic opioid analgesics therapy prescribed by our practice. Tramadol 50 mg, half a tablet p.o. daily MME/day: 2.5 mg/day    Medications ordered for procedure: Meds ordered this encounter  Medications   iohexol  (OMNIPAQUE ) 180 MG/ML injection 10 mL    Must be Myelogram-compatible. If not available, you may substitute with a water-soluble, non-ionic, hypoallergenic, myelogram-compatible radiological contrast medium.   lidocaine  (XYLOCAINE ) 2 % (with pres) injection 400 mg   pentafluoroprop-tetrafluoroeth (GEBAUERS) aerosol   sodium chloride  flush (NS) 0.9 % injection 2 mL   ropivacaine  (PF) 2 mg/mL (0.2%) (NAROPIN ) injection 2 mL   triamcinolone  acetonide (KENALOG -40) injection 40 mg   Medications administered: We administered iohexol , lidocaine , pentafluoroprop-tetrafluoroeth, sodium chloride  flush, ropivacaine  (PF) 2 mg/mL (0.2%), and triamcinolone  acetonide.  See the medical record for exact dosing, route, and time of administration.    Interventional Therapies  Risk  Complexity Considerations:   Xarelto  ANTICOAGULATION: (Stop: 3 days  Restart: 6 hours)   Planned  Pending:   Therapeutic midline L3-4 LESI #3    Under  consideration:   Diagnostic bilateral (L2-S1) lumbar facet MBB #1  Therapeutic midline L2-3 LESI #1  Therapeutic L2-3, L3-4, and L4-5 MILD procedure   Completed:   Therapeutic  midline L3-4 LESI x2 (05/07/2023) (100/100/100/100)  Therapeutic right L3-4 LESI x1 (12/04/2022) (100/100/100/100 for the right lower extremity pain that he was experiencing) Diagnostic/therapeutic left L3 & L4 TESI x1 (09/26/2021) (100/100/100/100 x 2 months) Therapeutic left L5 TFESI x2 (04/25/2021) (100/100/100/90-100)  Therapeutic left L5-S1 LESI x2 (08/30/2022) (100/100/70/ LBP:90  LLEP:100)  Therapeutic left L3-4 LESI x2 (12/20/2022) (100/90/65/100-65)  Therapeutic right L3-4 LESI x1 (04/25/2021) (100/100/100/90-100)    Therapeutic  Palliative (PRN) options:         Follow-up plan:   Return in about 2 weeks (around 10/29/2023) for (Face2F), (PPE).     Recent Visits Date Type Provider Dept  09/23/23 Office Visit Tanya Glisson, MD [facility]  Showing recent visits within past 90 days and meeting all other requirements Today's Visits Date Type Provider Dept  10/15/23 Procedure visit Tanya Glisson, MD [facility]  Showing today's visits and meeting all other requirements Future Appointments Date Type Provider Dept  10/30/23 Appointment Tanya Glisson, MD [facility]  Showing future appointments within next 90 days and meeting all other requirements   Disposition: Discharge home  Discharge (Date  Time): 10/15/2023; 1350 hrs.   Primary Care Physician: Todd Lauraine LABOR, NP Location: [facility] Note by: Glisson Bradford Tanya, MD (TTS technology used. I apologize for any typographical errors that were not detected and corrected.) Date: 10/15/2023; Time: 2:11 PM  Disclaimer:  Medicine is not an Visual merchandiser. The only guarantee in medicine is that nothing is guaranteed. It is important to note that the decision to proceed with this intervention was based on the information collected from the patient. The Data and conclusions were drawn from the patient's questionnaire, the interview, and the physical  examination. Because the information was provided in large part by the patient, it cannot be guaranteed that it has not been purposely or unconsciously manipulated. Bradford effort has been made to obtain as much relevant data as possible for this evaluation. It is important to note that the conclusions that lead to this procedure are derived in large part from the available data. Always take into account that the treatment will also be dependent on availability of resources and existing treatment guidelines, considered by other Pain Management Practitioners as being common knowledge and practice, at the time of the intervention. For Medico-Legal purposes, it is also important to point out that variation in procedural techniques and pharmacological choices are the acceptable norm. The indications, contraindications, technique, and results of the above procedure should only be interpreted and judged by a Board-Certified Interventional Pain Specialist with extensive familiarity and expertise in the same exact procedure and technique.

## 2023-10-15 NOTE — Progress Notes (Signed)
 Safety precautions to be maintained throughout the outpatient stay will include: orient to surroundings, keep bed in low position, maintain call bell within reach at all times, provide assistance with transfer out of bed and ambulation.

## 2023-10-15 NOTE — Patient Instructions (Signed)
 ______________________________________________________________________    Post-Procedure Discharge Instructions  INSTRUCTIONS Apply ice:  Purpose: This will minimize any swelling and discomfort after procedure.  When: Day of procedure, as soon as you get home. How: Fill a plastic sandwich bag with crushed ice. Cover it with a small towel and apply to injection site. How long: (15 min on, 15 min off) Apply for 15 minutes then remove x 15 minutes.  Repeat sequence on day of procedure, until you go to bed. Apply heat:  Purpose: To treat any soreness and discomfort from the procedure. When: Starting the next day after the procedure. How: Apply heat to procedure site starting the day following the procedure. How long: May continue to repeat daily, until discomfort goes away. Food intake: Start with clear liquids (like water) and advance to regular food, as tolerated.  Physical activities: Keep activities to a minimum for the first 8 hours after the procedure. After that, then as tolerated. Driving: If you have received any sedation, be responsible and do not drive. You are not allowed to drive for 24 hours after having sedation. Blood thinner: (Applies only to those taking blood thinners) You may restart your blood thinner 6 hours after your procedure. Insulin: (Applies only to Diabetic patients taking insulin) As soon as you can eat, you may resume your normal dosing schedule. Infection prevention: Keep procedure site clean and dry. Shower daily and clean area with soap and water.  PAIN DIARY Post-procedure Pain Diary: Extremely important that this be done correctly and accurately. Recorded information will be used to determine the next step in treatment. For the purpose of accuracy, follow these rules: Evaluate only the area treated. Do not report or include pain from an untreated area. For the purpose of this evaluation, ignore all other areas of pain, except for the treated area. After your  procedure, avoid taking a long nap and attempting to complete the pain diary after you wake up. Instead, set your alarm clock to go off every hour, on the hour, for the initial 8 hours after the procedure. Document the duration of the numbing medicine, and the relief you are getting from it. Do not go to sleep and attempt to complete it later. It will not be accurate. If you received sedation, it is likely that you were given a medication that may cause amnesia. Because of this, completing the diary at a later time may cause the information to be inaccurate. This information is needed to plan your care. Follow-up appointment: Keep your post-procedure follow-up evaluation appointment after the procedure (usually 2 weeks for most procedures, 6 weeks for radiofrequencies). DO NOT FORGET to bring you pain diary with you.   EXPECT... (What should I expect to see with my procedure?) From numbing medicine (AKA: Local Anesthetics): Numbness or decrease in pain. You may also experience some weakness, which if present, could last for the duration of the local anesthetic. Onset: Full effect within 15 minutes of injected. Duration: It will depend on the type of local anesthetic used. On the average, 1 to 8 hours.  From steroids (Applies only if steroids were used): Decrease in swelling or inflammation. Once inflammation is improved, relief of the pain will follow. Onset of benefits: Depends on the amount of swelling present. The more swelling, the longer it will take for the benefits to be seen. In some cases, up to 10 days. Duration: Steroids will stay in the system x 2 weeks. Duration of benefits will depend on multiple posibilities including persistent irritating  factors. Side-effects: If present, they may typically last 2 weeks (the duration of the steroids). Frequent: Cramps (if they occur, drink Gatorade and take over-the-counter Magnesium 450-500 mg once to twice a day); water retention with temporary weight  gain; increases in blood sugar; decreased immune system response; increased appetite. Occasional: Facial flushing (red, warm cheeks); mood swings; menstrual changes. Uncommon: Long-term decrease or suppression of natural hormones; bone thinning. (These are more common with higher doses or more frequent use. This is why we prefer that our patients avoid having any injection therapies in other practices.)  Very Rare: Severe mood changes; psychosis; aseptic necrosis. From procedure: Some discomfort is to be expected once the numbing medicine wears off. This should be minimal if ice and heat are applied as instructed.  CALL IF... (When should I call?) You experience numbness and weakness that gets worse with time, as opposed to wearing off. New onset bowel or bladder incontinence. (Applies only to procedures done in the spine)  Emergency Numbers: Durning business hours (Monday - Thursday, 8:00 AM - 4:00 PM) (Friday, 9:00 AM - 12:00 Noon): [phone] After hours: [phone] NOTE: If you are having a problem and are unable connect with, or to talk to a provider, then go to your nearest urgent care or emergency department. If the problem is serious and urgent, please call 911. ______________________________________________________________________     ______________________________________________________________________    Steroid injections  Common steroids for injections Triamcinolone : Used by many sports medicine physicians for large joint and bursal injections, often combined with a local anesthetic like lidocaine . A study focusing on coccydynia (tailbone pain) found triamcinolone  was more effective than betamethasone, suggesting it may also be preferable for other localized inflammation conditions. Methylprednisolone: A common alternative to triamcinolone  that is also a strong anti-inflammatory. It is available in different formulations, with the acetate suspension being the long-acting  option for intra-articular injections. Dexamethasone : This is a non-particulate steroid, meaning it has a lower risk of tissue damage compared to particulate steroids like triamcinolone  and methylprednisolone. While less common for this specific use, it is an option for targeted injections.   Considerations for physicians Particulate vs. non-particulate steroids: Triamcinolone  and methylprednisolone are particulate, meaning they can clump together. Dexamethasone  is non-particulate. Particulate steroids are often preferred for their longer-lasting effects but carry a theoretical higher risk for certain injections (though this is less of a concern in the costochondral joints). Combined injectate: Corticosteroids are typically mixed with a local anesthetic like lidocaine  to provide both immediate pain relief (from the anesthetic) and longer-term inflammation reduction (from the steroid). Imaging guidance: To ensure accurate placement of the needle and medication, physicians may use ultrasound or fluoroscopic guidance for the injection, especially in complex or refractory cases.   Patient guidance Before undergoing a steroid injection, discuss the options with your physician. They will determine the best steroid, dosage, and procedure for your specific case based on factors like: Severity of your condition History of response to other treatments Your overall health status Experience and preference of the physician  Last  Updated: 08/27/2023 ______________________________________________________________________

## 2023-10-16 ENCOUNTER — Telehealth: Payer: Self-pay

## 2023-10-29 NOTE — Progress Notes (Unsigned)
 PROVIDER NOTE: Interpretation of information contained herein should be left to medically-trained personnel. Specific patient instructions are provided elsewhere under Patient Instructions section of medical record. This document was created in part using AI and STT-dictation technology, any transcriptional errors that may result from this process are unintentional.  Patient: Todd Bradford  Service: E/M   PCP: Burton Lauraine LABOR, NP  DOB: Nov 26, 1950  DOS: 10/30/2023  Provider: Eric LABOR Como, MD  MRN: [id number]  Delivery: Virtual Visit  Specialty: Interventional Pain Management  Type: Established Patient  Setting: Ambulatory outpatient facility  Specialty designation: 09  Referring Prov.: Burton Lauraine LABOR, NP  Location: Remote location       Virtual Encounter - Pain Management PROVIDER NOTE: Information contained herein reflects review and annotations entered in association with encounter. Interpretation of such information and data should be left to medically-trained personnel. Information provided to patient can be located elsewhere in the medical record under Patient Instructions. Document created using STT-dictation technology, any transcriptional errors that may result from process are unintentional.    Contact & Pharmacy Preferred: [phone] Home: [phone] (home) Mobile: [phone] (mobile) E-mail: [email or web address]  [facility] - [place], [place] - [address] [place] [address] Phone: [phone] Fax: [phone]  [facility] - [place], [place] - [address] [place] [address] Phone: [phone] Fax: [phone]   Pre-screening  Todd Bradford offered in-person vs virtual encounter. He indicated preferring virtual for this encounter.   Reason COVID-19*  Social distancing based on CDC and AMA recommendations.   I contacted Todd Bradford on 10/30/2023 via telephone.      I clearly identified myself  as Eric LABOR Como, MD. I verified that I was speaking with the correct person using two identifiers (Name: Todd Bradford, and date of birth: [birth year]).  Consent I sought verbal advanced consent from Todd Bradford for virtual visit interactions. I informed Todd Bradford of possible security and privacy concerns, risks, and limitations associated with providing not-in-person medical evaluation and management services. I also informed Todd Bradford of the availability of in-person appointments. Finally, I informed him that there would be a charge for the virtual visit and that he could be  personally, fully or partially, financially responsible for it. Todd Bradford expressed understanding and agreed to proceed.   Historic Elements   Todd Bradford is a 73 y.o. year old, male patient evaluated today after our last contact on 10/15/2023. Todd Bradford  has a past medical history of Hyperlipidemia. He also  has a past surgical history that includes Neck surgery (2000 approx); Spine surgery; and Prostate surgery (04/2022). Todd Bradford has a current medication list which includes the following prescription(s): vitamin c, atorvastatin, cyclobenzaprine, finasteride, ibuprofen, rivaroxaban , tramadol, and triamcinolone  ointment. He  reports that he quit smoking about 18 years ago. His smoking use included cigarettes. He started smoking about 48 years ago. He has a 60 pack-year smoking history. He has never used smokeless tobacco. No history on file for alcohol use and drug use. Todd Bradford has no known allergies.  BMI: Estimated body mass index is 24.83 kg/m as calculated from the following:   Height as of 10/15/23: 6' 4 (1.93 m).   Weight as of 10/15/23: 204 lb (92.5 kg). Last encounter: 09/23/2023. Last procedure: 10/15/2023.  HPI  Today, he is being contacted for a post-procedure assessment.  Post-Procedure Evaluation   Type: Lumbar epidural steroid injection (LESI) (interlaminar) #3  Laterality: Midline    Level:  L3-4 Level.  Imaging: Fluoroscopic guidance Spinal ([id number]) Anesthesia: Local anesthesia (1-2% Lidocaine ) Anxiolysis: None                 Sedation: No Sedation                       DOS: 10/15/2023  Performed by: Eric DELENA Como, MD  Purpose: Diagnostic/Therapeutic Indications: Lumbar radicular pain of intraspinal etiology of more than 4 weeks that has failed to respond to conservative therapy and is severe enough to impact quality of life or function. 1. 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 Chronic lower extremity pain (2ry area of Pain) (Left)   3. Degeneration of intervertebral disc of lumbar region, unspecified whether pain present   4. 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 Inflammatory spondylopathy of lumbar region [facility])  (Right: L3-4)   6. 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 Lumbar lateral recess stenosis  (Right: L3-4)   9. Lumbosacral radiculopathy at L5 (Bilateral)   10. Lumbosacral radiculopathy at S1 (Bilateral)   11. Chronic anticoagulation (Xarelto )    NAS-11 Pain score:   Pre-procedure: 4 /10   Post-procedure: 0-No pain/10     Effectiveness:  Initial hour after procedure:   ***. Subsequent 4-6 hours post-procedure:   ***. Analgesia past initial 6 hours:   ***. Ongoing improvement:  Analgesic:  *** Function:    ***    ROM:    ***     Pharmacotherapy Assessment  Opioid Analgesic: no chronic opioid analgesics therapy prescribed by our practice. Tramadol 50 mg, half a tablet p.o. daily MME/day: 2.5 mg/day   Monitoring:  PMP: PDMP reviewed during this encounter.       Pharmacotherapy: No side-effects or adverse reactions reported. Compliance: No problems identified. Effectiveness: Clinically acceptable. Plan: Refer to POC.  UDS:  Summary  Date Value Ref Range Status  02/16/2020 FINAL  Final    Comment:     ==================================================================== [facility] COMP DRUG ANALYSIS,UR ==================================================================== Test                             Result       Flag       Units Drug Present and Declared for Prescription Verification   Tramadol                       5215         EXPECTED   ng/mg creat   O-Desmethyltramadol            6226         EXPECTED   ng/mg creat   N-Desmethyltramadol            1269         EXPECTED   ng/mg creat    Source of tramadol is a prescription medication.    O-desmethyltramadol and N-desmethyltramadol are expected    metabolites of tramadol.    Cyclobenzaprine                PRESENT      EXPECTED   Desmethylcyclobenzaprine       PRESENT      EXPECTED    Desmethylcyclobenzaprine is an expected metabolite of    cyclobenzaprine.  Drug Present  not Declared for Prescription Verification   Ephedrine/Pseudoephedrine      PRESENT      UNEXPECTED   Phenylpropanolamine            PRESENT      UNEXPECTED    Source of ephedrine/pseudoephedrine is most commonly    pseudoephedrine in over-the-counter or prescription cold and    allergy medications. Phenylpropanolamine is an expected    metabolite of ephedrine/pseudoephedrine.    Orphenadrine                    PRESENT      UNEXPECTED   Ibuprofen                      PRESENT      UNEXPECTED   Diphenhydramine                PRESENT      UNEXPECTED ==================================================================== Test                      Result    Flag   Units      Ref Range   Creatinine              74               mg/dL      >=79 ==================================================================== Declared Medications:  The flagging and interpretation on this report are based on the  following declared medications.  Unexpected results may arise from  inaccuracies in the declared medications.   **Note: The testing scope of this panel includes these  medications:   Cyclobenzaprine (Flexeril)  Tramadol (Ultram)   **Note: The testing scope of this panel does not include following  reported medications:   Atorvastatin (Lipitor)  Finasteride (Proscar)  Tamsulosin (Flomax) ==================================================================== For clinical consultation, please call [phone]. ====================================================================    No results found for: CBDTHCR, D8THCCBX, D9THCCBX  Laboratory Chemistry Profile   Renal Lab Results  Component Value Date   BUN 26 (H) 03/01/2020   CREATININE 1.24 03/01/2020   BCR 18 02/15/2020   GFRAA 64 02/15/2020   GFRNONAA >60 03/01/2020    Hepatic Lab Results  Component Value Date   AST 45 (H) 03/01/2020   ALT 77 (H) 03/01/2020   ALBUMIN 3.6 03/01/2020   ALKPHOS 161 (H) 03/01/2020    Electrolytes Lab Results  Component Value Date   NA 136 03/01/2020   K 4.3 03/01/2020   CL 100 03/01/2020   CALCIUM 8.9 03/01/2020   MG 2.3 02/15/2020    Bone Lab Results  Component Value Date   25OHVITD1 37 02/15/2020   25OHVITD2 <1.0 02/15/2020   25OHVITD3 37 02/15/2020    Inflammation (CRP: Acute Phase) (ESR: Chronic Phase) Lab Results  Component Value Date   CRP 9 02/15/2020   ESRSEDRATE 19 02/15/2020         Note: Above Lab results reviewed.  Imaging  DG PAIN CLINIC C-ARM 1-60 MIN NO REPORT Fluoro was used, but no Radiologist interpretation will be provided.  Please refer to NOTES tab for provider progress note.  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  Plan of Care  Problem-specific:  No problem-specific Assessment  &  Plan notes found for this encounter.  Todd Bradford is not on any long-term medications.  Pharmacotherapy (Medications Ordered): No orders of the defined types were placed in this encounter.  Orders:  No orders of the defined types were placed in this encounter.  Follow-up plan:   No follow-ups on file.      Interventional Therapies  Risk  Complexity Considerations:   Xarelto  ANTICOAGULATION: (Stop: 3 days  Restart: 6 hours)   Planned  Pending:   Therapeutic midline L3-4 LESI #3    Under consideration:   Diagnostic bilateral (L2-S1) lumbar facet MBB #1  Therapeutic midline L2-3 LESI #1  Therapeutic L2-3, L3-4, and L4-5 MILD procedure   Completed:   Therapeutic midline L3-4 LESI x2 (05/07/2023) (100/100/100/100)  Therapeutic right L3-4 LESI x1 (12/04/2022) (100/100/100/100 for the right lower extremity pain that he was experiencing) Diagnostic/therapeutic left L3 & L4 TESI x1 (09/26/2021) (100/100/100/100 x 2 months) Therapeutic left L5 TFESI x2 (04/25/2021) (100/100/100/90-100)  Therapeutic left L5-S1 LESI x2 (08/30/2022) (100/100/70/ LBP:90  LLEP:100)  Therapeutic left L3-4 LESI x2 (12/20/2022) (100/90/65/100-65)  Therapeutic right L3-4 LESI x1 (04/25/2021) (100/100/100/90-100)    Therapeutic  Palliative (PRN) options:         Recent Visits Date Type Provider Dept  10/15/23 Procedure visit Tanya Glisson, MD [facility]  09/23/23 Office Visit Tanya Glisson, MD [facility]  Showing recent visits within past 90 days and meeting all other requirements Future Appointments Date Type Provider Dept  10/30/23 Appointment Tanya Glisson, MD [facility]  Showing future appointments within next 90 days and meeting all other requirements  I discussed the assessment and treatment plan with the patient. The patient was provided an opportunity to ask questions and all were answered. The patient agreed with the plan and demonstrated  an understanding of the instructions.  Patient advised to call back or seek an in-person evaluation if the symptoms or condition worsens.  Duration of encounter: *** minutes.  Note by: Glisson DELENA Tanya, MD Date: 10/30/2023; Time: 8:20 AM

## 2023-10-30 ENCOUNTER — Ambulatory Visit (HOSPITAL_BASED_OUTPATIENT_CLINIC_OR_DEPARTMENT_OTHER): Admitting: Pain Medicine

## 2023-10-30 DIAGNOSIS — M5136 Other intervertebral disc degeneration, lumbar region with discogenic back pain only: Secondary | ICD-10-CM

## 2023-10-30 DIAGNOSIS — G8929 Other chronic pain: Secondary | ICD-10-CM

## 2023-10-30 DIAGNOSIS — Z09 Encounter for follow-up examination after completed treatment for conditions other than malignant neoplasm: Secondary | ICD-10-CM

## 2023-10-30 DIAGNOSIS — M4316 Spondylolisthesis, lumbar region: Secondary | ICD-10-CM

## 2023-10-30 DIAGNOSIS — M5417 Radiculopathy, lumbosacral region: Secondary | ICD-10-CM

## 2023-10-30 DIAGNOSIS — Z91199 Patient's noncompliance with other medical treatment and regimen due to unspecified reason: Secondary | ICD-10-CM
